# Patient Record
Sex: Female | Born: 1970 | Race: White | Hispanic: No | Marital: Married | Smoking: Former smoker
Health system: Southern US, Community
[De-identification: ages and names within clinical notes are randomized; demographics above are authoritative.]

## PROBLEM LIST (undated history)

## (undated) DIAGNOSIS — N390 Urinary tract infection, site not specified: Secondary | ICD-10-CM

## (undated) DIAGNOSIS — R51 Headache: Secondary | ICD-10-CM

## (undated) DIAGNOSIS — K589 Irritable bowel syndrome without diarrhea: Secondary | ICD-10-CM

## (undated) DIAGNOSIS — E8881 Metabolic syndrome: Secondary | ICD-10-CM

## (undated) DIAGNOSIS — R011 Cardiac murmur, unspecified: Secondary | ICD-10-CM

## (undated) DIAGNOSIS — M48061 Spinal stenosis, lumbar region without neurogenic claudication: Secondary | ICD-10-CM

## (undated) DIAGNOSIS — G9050 Complex regional pain syndrome I, unspecified: Secondary | ICD-10-CM

## (undated) DIAGNOSIS — B019 Varicella without complication: Secondary | ICD-10-CM

## (undated) DIAGNOSIS — E039 Hypothyroidism, unspecified: Secondary | ICD-10-CM

## (undated) DIAGNOSIS — F988 Other specified behavioral and emotional disorders with onset usually occurring in childhood and adolescence: Secondary | ICD-10-CM

## (undated) DIAGNOSIS — E88819 Insulin resistance, unspecified: Secondary | ICD-10-CM

## (undated) DIAGNOSIS — F329 Major depressive disorder, single episode, unspecified: Secondary | ICD-10-CM

## (undated) DIAGNOSIS — M5416 Radiculopathy, lumbar region: Secondary | ICD-10-CM

## (undated) DIAGNOSIS — K921 Melena: Secondary | ICD-10-CM

## (undated) DIAGNOSIS — F419 Anxiety disorder, unspecified: Secondary | ICD-10-CM

## (undated) DIAGNOSIS — J45909 Unspecified asthma, uncomplicated: Secondary | ICD-10-CM

## (undated) DIAGNOSIS — M1990 Unspecified osteoarthritis, unspecified site: Secondary | ICD-10-CM

## (undated) DIAGNOSIS — G90511 Complex regional pain syndrome I of right upper limb: Secondary | ICD-10-CM

## (undated) DIAGNOSIS — D649 Anemia, unspecified: Secondary | ICD-10-CM

## (undated) DIAGNOSIS — Z973 Presence of spectacles and contact lenses: Secondary | ICD-10-CM

## (undated) DIAGNOSIS — I10 Essential (primary) hypertension: Secondary | ICD-10-CM

## (undated) DIAGNOSIS — R519 Headache, unspecified: Secondary | ICD-10-CM

## (undated) DIAGNOSIS — B2790 Infectious mononucleosis, unspecified without complication: Secondary | ICD-10-CM

## (undated) DIAGNOSIS — J189 Pneumonia, unspecified organism: Secondary | ICD-10-CM

## (undated) DIAGNOSIS — Z9889 Other specified postprocedural states: Secondary | ICD-10-CM

## (undated) DIAGNOSIS — F32A Depression, unspecified: Secondary | ICD-10-CM

## (undated) DIAGNOSIS — B029 Zoster without complications: Secondary | ICD-10-CM

## (undated) DIAGNOSIS — R112 Nausea with vomiting, unspecified: Secondary | ICD-10-CM

## (undated) DIAGNOSIS — J302 Other seasonal allergic rhinitis: Secondary | ICD-10-CM

## (undated) HISTORY — DX: Infectious mononucleosis, unspecified without complication: B27.90

## (undated) HISTORY — DX: Cardiac murmur, unspecified: R01.1

## (undated) HISTORY — DX: Essential (primary) hypertension: I10

## (undated) HISTORY — DX: Headache, unspecified: R51.9

## (undated) HISTORY — DX: Varicella without complication: B01.9

## (undated) HISTORY — PX: BACK SURGERY: SHX140

## (undated) HISTORY — PX: ANTERIOR AND POSTERIOR SPINAL FUSION: SHX2259

## (undated) HISTORY — DX: Headache: R51

## (undated) HISTORY — DX: Hypothyroidism, unspecified: E03.9

## (undated) HISTORY — DX: Urinary tract infection, site not specified: N39.0

## (undated) HISTORY — DX: Depression, unspecified: F32.A

## (undated) HISTORY — DX: Other seasonal allergic rhinitis: J30.2

## (undated) HISTORY — PX: TUBAL LIGATION: SHX77

## (undated) HISTORY — PX: OTHER SURGICAL HISTORY: SHX169

## (undated) HISTORY — DX: Unspecified asthma, uncomplicated: J45.909

## (undated) HISTORY — PX: AUGMENTATION MAMMAPLASTY: SUR837

## (undated) HISTORY — DX: Melena: K92.1

## (undated) HISTORY — DX: Major depressive disorder, single episode, unspecified: F32.9

---

## 1998-03-14 HISTORY — PX: LEEP: SHX91

## 2003-03-15 HISTORY — PX: UTERINE FIBROID SURGERY: SHX826

## 2003-05-08 ENCOUNTER — Other Ambulatory Visit: Payer: Self-pay

## 2004-01-16 ENCOUNTER — Ambulatory Visit: Payer: Self-pay

## 2004-01-27 ENCOUNTER — Ambulatory Visit: Payer: Self-pay

## 2004-11-02 ENCOUNTER — Ambulatory Visit: Payer: Self-pay

## 2005-01-11 ENCOUNTER — Inpatient Hospital Stay: Payer: Self-pay

## 2006-03-14 HISTORY — PX: BREAST ENHANCEMENT SURGERY: SHX7

## 2006-09-13 ENCOUNTER — Emergency Department: Payer: Self-pay | Admitting: Emergency Medicine

## 2006-09-14 ENCOUNTER — Emergency Department: Payer: Self-pay | Admitting: Emergency Medicine

## 2006-09-25 ENCOUNTER — Ambulatory Visit: Payer: Self-pay

## 2006-10-11 ENCOUNTER — Ambulatory Visit: Payer: Self-pay

## 2007-02-15 ENCOUNTER — Ambulatory Visit (HOSPITAL_BASED_OUTPATIENT_CLINIC_OR_DEPARTMENT_OTHER): Admission: RE | Admit: 2007-02-15 | Discharge: 2007-02-16 | Payer: Self-pay | Admitting: Orthopedic Surgery

## 2007-02-26 ENCOUNTER — Encounter: Payer: Self-pay | Admitting: Orthopedic Surgery

## 2007-03-15 ENCOUNTER — Encounter: Payer: Self-pay | Admitting: Orthopedic Surgery

## 2007-03-15 HISTORY — PX: CARPAL TUNNEL RELEASE: SHX101

## 2007-03-15 HISTORY — PX: ABDOMINAL HYSTERECTOMY: SHX81

## 2007-09-21 ENCOUNTER — Ambulatory Visit (HOSPITAL_COMMUNITY): Admission: RE | Admit: 2007-09-21 | Discharge: 2007-09-22 | Payer: Self-pay | Admitting: Orthopaedic Surgery

## 2007-12-21 ENCOUNTER — Ambulatory Visit: Payer: Self-pay

## 2007-12-25 ENCOUNTER — Inpatient Hospital Stay: Payer: Self-pay

## 2008-09-02 ENCOUNTER — Emergency Department (HOSPITAL_COMMUNITY): Admission: EM | Admit: 2008-09-02 | Discharge: 2008-09-02 | Payer: Self-pay | Admitting: Emergency Medicine

## 2010-05-02 ENCOUNTER — Inpatient Hospital Stay (INDEPENDENT_AMBULATORY_CARE_PROVIDER_SITE_OTHER)
Admission: RE | Admit: 2010-05-02 | Discharge: 2010-05-02 | Disposition: A | Discharge status: Home / Self Care | Payer: Self-pay | Source: Ambulatory Visit | Attending: Emergency Medicine | Admitting: Emergency Medicine

## 2010-05-02 ENCOUNTER — Ambulatory Visit (INDEPENDENT_AMBULATORY_CARE_PROVIDER_SITE_OTHER): Payer: Self-pay

## 2010-05-02 DIAGNOSIS — J45909 Unspecified asthma, uncomplicated: Secondary | ICD-10-CM

## 2010-07-27 NOTE — Op Note (Signed)
NAMESALIMAH, MARTINOVICH NO.:  [id number]   MEDICAL RECORD NO.:  [id number]           PATIENT TYPE:   LOCATION:                                 FACILITY:   PHYSICIAN:  Mark C. Ophelia Charter, M.D.         DATE OF BIRTH:   DATE OF PROCEDURE:  09/21/2007  DATE OF DISCHARGE:                               OPERATIVE REPORT   PREOPERATIVE DIAGNOSIS:  C5-C6, C6-C7 spondylosis with disk herniation.   POSTOPERATIVE DIAGNOSIS:  C5-C6, C6-C7 spondylosis with disk herniation.   PROCEDURE:  C5-C6, C6-C7 anterior cervical diskectomy and fusion,  allograft and plating.   SURGEON:  Mark C. Ophelia Charter, MD   ASSISTANT:  Maud Deed, PA-C   ANESTHESIA:  GOT plus Marcaine local.   ESTIMATED BLOOD LOSS:  Minimal.   DRAINS:  One Hemovac neck.   IMPLANTS:  Cortical cancellous allograft lordotic.  View-lock Biomet  anterior cervical plate with 11-BJ screws.   DESCRIPTION OF PROCEDURE:  After induction of general anesthesia,  orotracheal intubation, preoperative antibiotics, surgical time-out  checklist after prepping draping, sterile Mayo stand of the head,  sterile skin marker, Biodrape and thyroid sheet and drape application.  After time-out procedure, an incision was made at the midline extending  to the left.  Platysma was divided in line with the skin incision.  Blunt dissection down to the level of the longus coli muscle was  performed with self-retaining retractors placed after identification of  the appropriate level using a lateral C-arm visualization.  Diskectomy  was performed and operative microscope was draped and brought in.  At  the C5-C6 level, there was a right paracentral disk protrusion with  overhanging spurs posteriorly which had to be trimmed and debrided for  exposure of the dura.  There was flattening of the cord once it was  decompressed, sizing the graft, placement of cortical cancellous graft  tightly with head halter traction applied by the C.R.N.A.  Identical  procedure was repeated at C6-C7 level with this level showing narrowing  down to 7 mm by MRI.  Disk osteophyte complex caused significant  foraminal compression of this level in the right and this was trimmed  and debrided back using microdissection and 110-mm Kerrisons.  Uncovertebral joints were stripped.  Plate was sized, appropriately  checked in AP and lateral and fluoroscopy and then holes were hand  drilled and then placement of screws in all six holes.  After irrigation  with saline solution, the operative field was dry.  Hemovac was placed  in an in and out technique in line with the skin incision, 3-0 Vicryl in  the platysma, 4-0 Vicryl subcuticular closure,  tincture of Benzoin and Steri-Strips, Marcaine infiltration, postop  dressing and then application of soft cervical collar and transferred to  recovery room in stable condition.  Instrument count and needle count  was correct.  Surgical checklist was completed.      Mark C. Ophelia Charter, M.D.  Electronically Signed     MCY/MEDQ  D:  10/25/2007  T:  10/26/2007  Job:  [id number]

## 2010-07-27 NOTE — Op Note (Signed)
NAMESUEELLEN, KAYES                ACCOUNT NO.:  [id number]   MEDICAL RECORD NO.:  [id number]          PATIENT TYPE:  AMB   LOCATION:  DSC                          FACILITY:  [facility]   PHYSICIAN:  Cindee Salt, M.D.       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   OPERATION:  Carpal tunnel release under stellate block, axillary, and  general.   SURGEON:  Cindee Salt, M.D.   HISTORY:  The patient is a 40 year old female who suffered an injury to  her right wrist.  She has developed complex regional pain.  With  treatment of complex regional pain, an underlying carpal tunnel syndrome  had developed as a feeding underlying etiology of her complex regional  pain.  She is been undergoing blocks with some relief.  She is admitted  now for release of her carpal tunnel.  She is aware of risks and  complications, that there is no guarantee with the surgery, possibility  of infection, recurrence, injury to arteries, nerves and tendons,  incomplete relief of symptoms, dystrophy, exacerbation of the complex  regional pain.  This will be done under a block with overnight stay for  pain control with the possibility of a second stellate block being given  in the morning, depending on symptoms.  In the preoperative area, the  patient is seen, questions encouraged and answered, a block given for  pain control.   PROCEDURE:  The patient was brought to the operating room, where a  general anesthetic was carried out without difficulty.  She was prepped  using DuraPrep, supine position, right arm free.  A longitudinal  incision was made in the palm and carried down through subcutaneous  tissue.  A tourniquet placed on the forearm was inflated to 225 mmHg  after  exsanguination.  The palmar fascia was split, bleeders  electrocauterized, superficial palmar arch identified, flexor tendons to  the ring and little finger identified.  To the ulnar side of the median  nerve, the carpal retinaculum was incised with sharp dissection.  A  right-angle and Sewall retractor were placed between skin and forearm  fascia.  The fascia was released for approximately a centimeter and half  proximal to the wrist crease under direct vision.  The canal was  explored; area of compression to the nerve was apparent.  No further  lesions were identified.  The wound was irrigated.  The skin was then  closed with interrupted 5-0 Vicryl Rapide sutures.  A sterile  compressive dressing and splint to the wrist with the fingers free were  applied.  The patient tolerated the procedure well and was taken to the  recovery room for observation in satisfactory condition.  She will be  admitted for overnight stay for pain control.  She will be discharged on  Ultram 50 mg one 6 hours with food and precautions.  ______________________________  Cindee Salt, M.D.     GK/MEDQ  D:  02/15/2007  T:  02/15/2007  Job:  [id number]

## 2010-12-09 LAB — COMPREHENSIVE METABOLIC PANEL
ALT: 13
AST: 16
Albumin: 4.2
Alkaline Phosphatase: 35 — ABNORMAL LOW
BUN: 5 — ABNORMAL LOW
CO2: 33 — ABNORMAL HIGH
Calcium: 9.5
Chloride: 96
Creatinine, Ser: 0.58
GFR calc Af Amer: >60
GFR calc non Af Amer: >60
Glucose, Bld: 101 — ABNORMAL HIGH
Potassium: 3.5
Sodium: 136
Total Bilirubin: 0.7
Total Protein: 6.7

## 2010-12-09 LAB — CBC
HCT: 39.1
Hemoglobin: 13.6
MCHC: 34.9
MCV: 93
Platelets: 250
RBC: 4.21
RDW: 10.8 — ABNORMAL LOW
WBC: 5.7

## 2010-12-09 LAB — URINALYSIS, ROUTINE W REFLEX MICROSCOPIC
Bilirubin Urine: NEGATIVE
Glucose, UA: NEGATIVE
Hgb urine dipstick: NEGATIVE
Ketones, ur: NEGATIVE
Nitrite: NEGATIVE
Protein, ur: NEGATIVE
Specific Gravity, Urine: 1.007
Urobilinogen, UA: 0.2
pH: 7.5

## 2010-12-09 LAB — PROTIME-INR
INR: 1.1
Prothrombin Time: 14

## 2010-12-09 LAB — APTT: aPTT: 27

## 2010-12-20 LAB — I-STAT 8, (EC8 V) (CONVERTED LAB)
Acid-Base Excess: 2
BUN: 19
Bicarbonate: 29.2 — ABNORMAL HIGH
Chloride: 103
Glucose, Bld: 78
HCT: 43
Hemoglobin: 14.6
Operator id: 208731
Potassium: 4
Sodium: 139
TCO2: 31
pCO2, Ven: 53.5 — ABNORMAL HIGH
pH, Ven: 7.345 — ABNORMAL HIGH

## 2011-06-21 DIAGNOSIS — J45909 Unspecified asthma, uncomplicated: Secondary | ICD-10-CM | POA: Insufficient documentation

## 2011-06-21 DIAGNOSIS — F4310 Post-traumatic stress disorder, unspecified: Secondary | ICD-10-CM | POA: Insufficient documentation

## 2012-06-06 DIAGNOSIS — G894 Chronic pain syndrome: Secondary | ICD-10-CM | POA: Insufficient documentation

## 2014-02-25 DIAGNOSIS — F988 Other specified behavioral and emotional disorders with onset usually occurring in childhood and adolescence: Secondary | ICD-10-CM | POA: Insufficient documentation

## 2014-02-25 DIAGNOSIS — F32A Depression, unspecified: Secondary | ICD-10-CM | POA: Insufficient documentation

## 2014-02-25 DIAGNOSIS — F329 Major depressive disorder, single episode, unspecified: Secondary | ICD-10-CM | POA: Insufficient documentation

## 2015-03-15 HISTORY — PX: SPINAL CORD STIMULATOR INSERTION: SHX5378

## 2016-04-19 ENCOUNTER — Other Ambulatory Visit: Payer: Self-pay | Admitting: Family Medicine

## 2016-04-19 DIAGNOSIS — Z1239 Encounter for other screening for malignant neoplasm of breast: Secondary | ICD-10-CM

## 2016-05-19 ENCOUNTER — Ambulatory Visit: Payer: Self-pay | Attending: Family Medicine

## 2017-03-29 DIAGNOSIS — F332 Major depressive disorder, recurrent severe without psychotic features: Secondary | ICD-10-CM | POA: Diagnosis not present

## 2017-03-29 DIAGNOSIS — F4310 Post-traumatic stress disorder, unspecified: Secondary | ICD-10-CM | POA: Diagnosis not present

## 2017-04-18 DIAGNOSIS — G90519 Complex regional pain syndrome I of unspecified upper limb: Secondary | ICD-10-CM | POA: Diagnosis not present

## 2017-04-18 DIAGNOSIS — Z79899 Other long term (current) drug therapy: Secondary | ICD-10-CM | POA: Diagnosis not present

## 2017-04-18 DIAGNOSIS — Z5181 Encounter for therapeutic drug level monitoring: Secondary | ICD-10-CM | POA: Diagnosis not present

## 2017-04-18 DIAGNOSIS — M25552 Pain in left hip: Secondary | ICD-10-CM | POA: Diagnosis not present

## 2017-04-18 DIAGNOSIS — G90511 Complex regional pain syndrome I of right upper limb: Secondary | ICD-10-CM | POA: Diagnosis not present

## 2017-04-18 DIAGNOSIS — G894 Chronic pain syndrome: Secondary | ICD-10-CM | POA: Diagnosis not present

## 2017-04-28 DIAGNOSIS — F332 Major depressive disorder, recurrent severe without psychotic features: Secondary | ICD-10-CM | POA: Diagnosis not present

## 2017-04-28 DIAGNOSIS — F4310 Post-traumatic stress disorder, unspecified: Secondary | ICD-10-CM | POA: Diagnosis not present

## 2017-05-02 DIAGNOSIS — F4312 Post-traumatic stress disorder, chronic: Secondary | ICD-10-CM | POA: Diagnosis not present

## 2017-05-02 DIAGNOSIS — F900 Attention-deficit hyperactivity disorder, predominantly inattentive type: Secondary | ICD-10-CM | POA: Diagnosis not present

## 2017-05-09 DIAGNOSIS — F332 Major depressive disorder, recurrent severe without psychotic features: Secondary | ICD-10-CM | POA: Diagnosis not present

## 2017-05-09 DIAGNOSIS — F4310 Post-traumatic stress disorder, unspecified: Secondary | ICD-10-CM | POA: Diagnosis not present

## 2017-05-22 DIAGNOSIS — Z9889 Other specified postprocedural states: Secondary | ICD-10-CM | POA: Diagnosis not present

## 2017-05-22 DIAGNOSIS — Z79899 Other long term (current) drug therapy: Secondary | ICD-10-CM | POA: Diagnosis not present

## 2017-05-22 DIAGNOSIS — Z5181 Encounter for therapeutic drug level monitoring: Secondary | ICD-10-CM | POA: Diagnosis not present

## 2017-05-22 DIAGNOSIS — M25552 Pain in left hip: Secondary | ICD-10-CM | POA: Diagnosis not present

## 2017-05-22 DIAGNOSIS — G894 Chronic pain syndrome: Secondary | ICD-10-CM | POA: Diagnosis not present

## 2017-06-13 DIAGNOSIS — F332 Major depressive disorder, recurrent severe without psychotic features: Secondary | ICD-10-CM | POA: Diagnosis not present

## 2017-06-13 DIAGNOSIS — F4310 Post-traumatic stress disorder, unspecified: Secondary | ICD-10-CM | POA: Diagnosis not present

## 2017-06-29 DIAGNOSIS — F332 Major depressive disorder, recurrent severe without psychotic features: Secondary | ICD-10-CM | POA: Diagnosis not present

## 2017-07-03 DIAGNOSIS — Z5181 Encounter for therapeutic drug level monitoring: Secondary | ICD-10-CM | POA: Diagnosis not present

## 2017-07-03 DIAGNOSIS — G90519 Complex regional pain syndrome I of unspecified upper limb: Secondary | ICD-10-CM | POA: Diagnosis not present

## 2017-07-03 DIAGNOSIS — M25552 Pain in left hip: Secondary | ICD-10-CM | POA: Diagnosis not present

## 2017-07-03 DIAGNOSIS — Z79899 Other long term (current) drug therapy: Secondary | ICD-10-CM | POA: Diagnosis not present

## 2017-07-03 DIAGNOSIS — G894 Chronic pain syndrome: Secondary | ICD-10-CM | POA: Diagnosis not present

## 2017-07-11 DIAGNOSIS — M7918 Myalgia, other site: Secondary | ICD-10-CM | POA: Diagnosis not present

## 2017-07-18 DIAGNOSIS — F332 Major depressive disorder, recurrent severe without psychotic features: Secondary | ICD-10-CM | POA: Diagnosis not present

## 2017-08-08 DIAGNOSIS — F332 Major depressive disorder, recurrent severe without psychotic features: Secondary | ICD-10-CM | POA: Diagnosis not present

## 2017-08-24 DIAGNOSIS — F332 Major depressive disorder, recurrent severe without psychotic features: Secondary | ICD-10-CM | POA: Diagnosis not present

## 2017-09-04 DIAGNOSIS — G894 Chronic pain syndrome: Secondary | ICD-10-CM | POA: Diagnosis not present

## 2017-09-04 DIAGNOSIS — Z6379 Other stressful life events affecting family and household: Secondary | ICD-10-CM | POA: Diagnosis not present

## 2017-09-04 DIAGNOSIS — F4310 Post-traumatic stress disorder, unspecified: Secondary | ICD-10-CM | POA: Diagnosis not present

## 2017-09-12 DIAGNOSIS — F332 Major depressive disorder, recurrent severe without psychotic features: Secondary | ICD-10-CM | POA: Diagnosis not present

## 2017-11-17 ENCOUNTER — Encounter: Payer: Self-pay | Admitting: Primary Care

## 2017-11-17 ENCOUNTER — Ambulatory Visit (INDEPENDENT_AMBULATORY_CARE_PROVIDER_SITE_OTHER): Payer: PPO | Admitting: Primary Care

## 2017-11-17 ENCOUNTER — Ambulatory Visit: Payer: PPO | Admitting: Primary Care

## 2017-11-17 VITALS — BP 124/84 | HR 92 | Temp 98.0°F | Ht 60.0 in | Wt 168.5 lb

## 2017-11-17 DIAGNOSIS — G9050 Complex regional pain syndrome I, unspecified: Secondary | ICD-10-CM | POA: Diagnosis not present

## 2017-11-17 DIAGNOSIS — F988 Other specified behavioral and emotional disorders with onset usually occurring in childhood and adolescence: Secondary | ICD-10-CM

## 2017-11-17 DIAGNOSIS — Z1239 Encounter for other screening for malignant neoplasm of breast: Secondary | ICD-10-CM

## 2017-11-17 DIAGNOSIS — F32A Depression, unspecified: Secondary | ICD-10-CM

## 2017-11-17 DIAGNOSIS — J4520 Mild intermittent asthma, uncomplicated: Secondary | ICD-10-CM | POA: Diagnosis not present

## 2017-11-17 DIAGNOSIS — E282 Polycystic ovarian syndrome: Secondary | ICD-10-CM | POA: Insufficient documentation

## 2017-11-17 DIAGNOSIS — E039 Hypothyroidism, unspecified: Secondary | ICD-10-CM | POA: Insufficient documentation

## 2017-11-17 DIAGNOSIS — I10 Essential (primary) hypertension: Secondary | ICD-10-CM

## 2017-11-17 DIAGNOSIS — F4310 Post-traumatic stress disorder, unspecified: Secondary | ICD-10-CM

## 2017-11-17 DIAGNOSIS — G894 Chronic pain syndrome: Secondary | ICD-10-CM | POA: Diagnosis not present

## 2017-11-17 DIAGNOSIS — Z1231 Encounter for screening mammogram for malignant neoplasm of breast: Secondary | ICD-10-CM | POA: Diagnosis not present

## 2017-11-17 DIAGNOSIS — F329 Major depressive disorder, single episode, unspecified: Secondary | ICD-10-CM

## 2017-11-17 LAB — COMPREHENSIVE METABOLIC PANEL
ALT: 19 U/L (ref 0–35)
AST: 15 U/L (ref 0–37)
Albumin: 4.6 g/dL (ref 3.5–5.2)
Alkaline Phosphatase: 60 U/L (ref 39–117)
BUN: 15 mg/dL (ref 6–23)
CO2: 34 mEq/L — ABNORMAL HIGH (ref 19–32)
Calcium: 9.8 mg/dL (ref 8.4–10.5)
Chloride: 97 mEq/L (ref 96–112)
Creatinine, Ser: 0.77 mg/dL (ref 0.40–1.20)
GFR: 85.6 mL/min (ref >60.00)
Glucose, Bld: 142 mg/dL — ABNORMAL HIGH (ref 70–99)
Potassium: 3.6 mEq/L (ref 3.5–5.1)
Sodium: 137 mEq/L (ref 135–145)
Total Bilirubin: 0.5 mg/dL (ref 0.2–1.2)
Total Protein: 7.2 g/dL (ref 6.0–8.3)

## 2017-11-17 LAB — HEMOGLOBIN A1C: Hgb A1c MFr Bld: 5.7 % (ref 4.6–6.5)

## 2017-11-17 LAB — T3, FREE: T3, Free: 3.2 pg/mL (ref 2.3–4.2)

## 2017-11-17 LAB — TSH: TSH: 0.69 u[IU]/mL (ref 0.35–4.50)

## 2017-11-17 LAB — T4, FREE: Free T4: 0.91 ng/dL (ref 0.60–1.60)

## 2017-11-17 MED ORDER — ALBUTEROL SULFATE HFA 108 (90 BASE) MCG/ACT IN AERS: 2.0000 | INHALATION_SPRAY | Freq: Four times a day (QID) | RESPIRATORY_TRACT | 0 refills | Status: DC | PRN

## 2017-11-17 MED ORDER — HYDROCHLOROTHIAZIDE 50 MG PO TABS: 50.0000 mg | ORAL_TABLET | Freq: Every day | ORAL | 0 refills | Status: DC

## 2017-11-17 MED ORDER — LEVOTHYROXINE SODIUM 75 MCG PO TABS
ORAL_TABLET | ORAL | 0 refills | Status: DC
Start: 2017-11-17 — End: 2018-02-12

## 2017-11-17 NOTE — Assessment & Plan Note (Signed)
Compliant to levothyroxine 75 mcg. Repeat TSH pending. Refills sent to pharmacy.

## 2017-11-17 NOTE — Assessment & Plan Note (Signed)
Chronic to left extremity, hips, back according to records.  Following with pain management, continue same.

## 2017-11-17 NOTE — Assessment & Plan Note (Signed)
Following with psychiatry, feels well managed. Continue same. 

## 2017-11-17 NOTE — Assessment & Plan Note (Signed)
Seems to be using albuterol appropriately. Will continue to monitor.

## 2017-11-17 NOTE — Patient Instructions (Addendum)
Stop by the lab prior to leaving today. I will notify you of your results once received.   I sent refills to your pharmacy of levothyroxine, hydrochlorothiazide, and albuterol inhaler.  Please schedule a physical with me at your convenience.   It was a pleasure to meet you today! Please don't hesitate to call or message me with any questions. Welcome to [facility]!

## 2017-11-17 NOTE — Progress Notes (Signed)
Subjective:    Patient ID: Tanya [name], female    DOB: Sep 14, 1970, 47 y.o.   MRN: [id number]  HPI  Tanya [name] is a 47 year old female who presents today to establish care and discuss the problems mentioned below. Will obtain old records. She was discharged from her last practice due to no shows and lack of recommended follow up. She would like her mammogram done.  1) Hypothyroidism: Currently managed on levothyroxine 75 mcg tablets. Her last documented TSH was 2.051 in October 2018. She thinks her thyroid levels may be out of range. She is compliant to her levothyroxine and took her last pill today.  2) ADD/PTSD: Currently following with Jolee Ewing and is managed on Adderall XR 20 mg, Adderall 20 mg BID, Effexor XR 150 mg, Alprazolam XR 1 mg PRN. She takes her alprazolam 2-3 times daily on average.   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 History of left hip pain, myofascial muscle pain. Previously managed on Fentanyl patches and numerous other medications (Morphine, Methadone). She is currently managed on Tiagabine 16 mg HS. She has a deep spine stimulator to her lower spine that was implanted in 2017.   4) Lower Extremity Edema/Hypertension: Currently managed on HCTZ 50 mg. Reports it is secondary to complex regional pain syndrome. She experiences edema mostly to her lower extremities, also upper extremities and face.  BP Readings from Last 3 Encounters:  11/17/17 124/84    5) PCOS: Previously managed on metformin. Her last documented A1C was 5.3 in February 2018. She is status post hysterectomy and endorses that she has one ovary remaining.   6) Asthma: Diagnosed in 1996. Currently managed on Ventolin HFA for which she uses once every 7-10 days. She is needing a refill today.  Review of Systems  Respiratory: Negative for shortness of breath and wheezing.   Cardiovascular: Negative for chest pain.  Musculoskeletal:   Chronic pain. CRPS.  Psychiatric/Behavioral:       Follows with psychiatry, feels well managed.        Past Medical History:  Diagnosis Date  . Asthma   . Blood in stool   . Chickenpox   . Depression   . Frequent headaches   . Heart murmur   . Hypertension   . Hypothyroidism   . Mononucleosis   . Seasonal allergies   . UTI (urinary tract infection)      Social History   Socioeconomic History  . Marital status: Married    Spouse name: Not on file  . Number of children: Not on file  . Years of education: Not on file  . Highest education level: Not on file  Occupational History  . Not on file  Social Needs  . Financial resource strain: Not on file  . Food insecurity:    Worry: Not on file    Inability: Not on file  . Transportation needs:    Medical: Not on file    Non-medical: Not on file  Tobacco Use  . Smoking status: Never Smoker  . Smokeless tobacco: Never Used  Substance and Sexual Activity  . Alcohol use: Yes  . Drug use: Not on file  . Sexual activity: Not on file  Lifestyle  . Physical activity:    Days per week: Not on file    Minutes per session: Not on file  . Stress: Not on file  Relationships  . Social connections:    Talks on phone:  Not on file    Gets together: Not on file    Attends religious service: Not on file    Active member of club or organization: Not on file    Attends meetings of clubs or organizations: Not on file    Relationship status: Not on file  . Intimate partner violence:    Fear of current or ex partner: Not on file    Emotionally abused: Not on file    Physically abused: Not on file    Forced sexual activity: Not on file  Other Topics Concern  . Not on file  Social History Narrative   Married.   Disabled.   Once worked as an Therapist, sports.    Past Surgical History:  Procedure Laterality Date  . ABDOMINAL HYSTERECTOMY  2009  . ANTERIOR AND POSTERIOR SPINAL FUSION    . BREAST ENHANCEMENT SURGERY  2008  . CARPAL TUNNEL  RELEASE Right 2009  . [place], 2006  . LEEP  2000  . SPINAL CORD STIMULATOR INSERTION  2017  . UTERINE FIBROID SURGERY  2005    Family History  Problem Relation Age of Onset  . Depression Mother   . Learning disabilities Mother   . Depression Father   . Early death Father   . Depression Sister   . Drug abuse Sister   . Early death Sister   . Mental illness Sister   . Miscarriages / Stillbirths Sister   . Multiple sclerosis Sister   . Alcohol abuse Daughter   . Depression Daughter   . Diabetes Daughter   . Learning disabilities Daughter   . Depression Son   . Learning disabilities Son   . Cancer Maternal Grandmother   . Hearing loss Maternal Grandmother   . Alcohol abuse Paternal Grandmother   . Heart disease Paternal Grandmother   . Hyperlipidemia Paternal Grandmother   . Hypertension Paternal Grandmother   . Kidney disease Paternal Grandmother   . ADD / ADHD Paternal Grandfather   . COPD Paternal Grandfather   . Kidney disease Paternal Grandfather   . Hypertension Paternal Grandfather   . Hyperlipidemia Paternal Grandfather   . Depression Son     Allergies  Allergen Reactions  . Hydrocodone-Acetaminophen Itching, Other (See Comments) and Hives    Other Reaction: Other reaction Other Reaction: Other reaction, hives   . Oxycodone-Acetaminophen Itching, Other (See Comments) and Hives    Other Reaction: Other reaction Other Reaction: Other reaction, hives     Current Outpatient Medications on File Prior to Visit  Medication Sig Dispense Refill  . ALPRAZOLAM XR 1 MG 24 hr tablet Take 1 mg by mouth 3 (three) times daily.     Marland Kitchen amphetamine-dextroamphetamine (ADDERALL XR) 20 MG 24 hr capsule Take 20 mg by mouth daily.     Marland Kitchen amphetamine-dextroamphetamine (ADDERALL) 20 MG tablet Take 20 mg by mouth 2 (two) times daily.    Marland Kitchen buPROPion (WELLBUTRIN XL) 150 MG 24 hr tablet Take 2 tablets by mouth every morning and 1 tablet every evening.    Marland Kitchen doxepin  (SINEQUAN) 50 MG capsule 1 or 2 tablet by mouth at bedtime    . tiaGABine (GABITRIL) 4 MG tablet Take 16 mg by mouth at bedtime.    Marland Kitchen venlafaxine XR (EFFEXOR-XR) 150 MG 24 hr capsule Take 300 mg by mouth daily with breakfast.     . vitamin B-12 (CYANOCOBALAMIN) 1000 MCG tablet Take by mouth.     No current facility-administered medications on file prior  to visit.     BP 124/84   Pulse 92   Temp 98 F (36.7 C) (Oral)   Ht 5' (1.524 m)   Wt 168 lb 8 oz (76.4 kg)   SpO2 99%   BMI 32.91 kg/m    Objective:   Physical Exam  Constitutional: She appears well-nourished.  Neck: Neck supple.  Cardiovascular: Normal rate and regular rhythm.  Respiratory: Effort normal and breath sounds normal.  Skin: Skin is warm and dry.  Psychiatric: She has a normal mood and affect.           Assessment & Plan:

## 2017-11-17 NOTE — Assessment & Plan Note (Signed)
Managed on high doses of HCTZ, endorses this is for her edema secondary to CRPS. Based off of record review it also appears she has hypertension. Once manage done clonidine patches, likely for HTN.  BP stable in the office today, continue current regimen. BMP pending.

## 2017-11-17 NOTE — Assessment & Plan Note (Signed)
Following with pain management and feels well managed. Continue current regimen.

## 2017-11-17 NOTE — Assessment & Plan Note (Signed)
Following with psychiatry, compliant to regimen.  Continue same.

## 2017-11-17 NOTE — Assessment & Plan Note (Signed)
One ovary remaining per patient. Check A1C.

## 2017-11-21 ENCOUNTER — Encounter: Payer: Self-pay | Admitting: *Deleted

## 2017-11-21 ENCOUNTER — Encounter (INDEPENDENT_AMBULATORY_CARE_PROVIDER_SITE_OTHER): Payer: Self-pay

## 2017-11-28 ENCOUNTER — Encounter

## 2017-11-28 ENCOUNTER — Ambulatory Visit: Payer: PPO | Admitting: Primary Care

## 2017-12-05 DIAGNOSIS — F332 Major depressive disorder, recurrent severe without psychotic features: Secondary | ICD-10-CM | POA: Diagnosis not present

## 2017-12-14 ENCOUNTER — Encounter: Payer: PPO | Admitting: Primary Care

## 2017-12-15 DIAGNOSIS — M25552 Pain in left hip: Secondary | ICD-10-CM | POA: Diagnosis not present

## 2017-12-15 DIAGNOSIS — G8929 Other chronic pain: Secondary | ICD-10-CM | POA: Diagnosis not present

## 2017-12-19 ENCOUNTER — Encounter: Payer: Self-pay | Admitting: Primary Care

## 2017-12-19 ENCOUNTER — Telehealth: Payer: Self-pay

## 2017-12-19 ENCOUNTER — Ambulatory Visit (INDEPENDENT_AMBULATORY_CARE_PROVIDER_SITE_OTHER): Payer: PPO | Admitting: Primary Care

## 2017-12-19 VITALS — BP 120/82 | HR 78 | Temp 98.6°F | Ht 60.0 in | Wt 173.2 lb

## 2017-12-19 DIAGNOSIS — R8290 Unspecified abnormal findings in urine: Secondary | ICD-10-CM | POA: Diagnosis not present

## 2017-12-19 LAB — POC URINALSYSI DIPSTICK (AUTOMATED)
Bilirubin, UA: NEGATIVE
Blood, UA: NEGATIVE
Glucose, UA: NEGATIVE
Ketones, UA: NEGATIVE
Leukocytes, UA: NEGATIVE
Nitrite, UA: NEGATIVE
Protein, UA: NEGATIVE
Spec Grav, UA: 1.015 (ref 1.010–1.025)
Urobilinogen, UA: 0.2 E.U./dL
pH, UA: 6 (ref 5.0–8.0)

## 2017-12-19 NOTE — Patient Instructions (Signed)
We will send off your urine to culture.  Make sure to drink plenty of water and rest. You can try applying heat/ice to the lower back, okay to take Naproxen as needed.  We will see you Thursday this week for your physical.   It was a pleasure to see you today!

## 2017-12-19 NOTE — Telephone Encounter (Signed)
Patient evaluated.  

## 2017-12-19 NOTE — Progress Notes (Signed)
Subjective:    Patient ID: Tanya [name], female    DOB: May 20, 1970, 47 y.o.   MRN: [id number]  HPI  Tanya [name] is a 47 year old female with a history of chronic pain syndrome who presents today with a chief complaint of back pain.  She also reports foul smelling urine, pelvic pain, urinary urgency, cloudy urine, nausea. Her symptoms began 2-3 days ago. She denies vaginal discharge, vaginal itching, hematuria. She denies fevers, vomiting.  Review of Systems  Constitutional: Negative for fever.  Genitourinary: Positive for pelvic pain and urgency. Negative for dysuria and hematuria.       Cloudy and foul smelling urine       Past Medical History:  Diagnosis Date  . Asthma   . Blood in stool   . Chickenpox   . Depression   . Frequent headaches   . Heart murmur   . Hypertension   . Hypothyroidism   . Mononucleosis   . Seasonal allergies   . UTI (urinary tract infection)      Social History   Socioeconomic History  . Marital status: Married    Spouse name: Not on file  . Number of children: Not on file  . Years of education: Not on file  . Highest education level: Not on file  Occupational History  . Not on file  Social Needs  . Financial resource strain: Not on file  . Food insecurity:    Worry: Not on file    Inability: Not on file  . Transportation needs:    Medical: Not on file    Non-medical: Not on file  Tobacco Use  . Smoking status: Never Smoker  . Smokeless tobacco: Never Used  Substance and Sexual Activity  . Alcohol use: Yes  . Drug use: Not on file  . Sexual activity: Not on file  Lifestyle  . Physical activity:    Days per week: Not on file    Minutes per session: Not on file  . Stress: Not on file  Relationships  . Social connections:    Talks on phone: Not on file    Gets together: Not on file    Attends religious service: Not on file    Active member of club or organization: Not on file    Attends meetings of clubs or organizations: Not  on file    Relationship status: Not on file  . Intimate partner violence:    Fear of current or ex partner: Not on file    Emotionally abused: Not on file    Physically abused: Not on file    Forced sexual activity: Not on file  Other Topics Concern  . Not on file  Social History Narrative   Married.   Disabled.   Once worked as an Therapist, sports.    Past Surgical History:  Procedure Laterality Date  . ABDOMINAL HYSTERECTOMY  2009  . ANTERIOR AND POSTERIOR SPINAL FUSION    . BREAST ENHANCEMENT SURGERY  2008  . CARPAL TUNNEL RELEASE Right 2009  . [place], 2006  . LEEP  2000  . SPINAL CORD STIMULATOR INSERTION  2017  . UTERINE FIBROID SURGERY  2005    Family History  Problem Relation Age of Onset  . Depression Mother   . Learning disabilities Mother   . Depression Father   . Early death Father   . Depression Sister   . Drug abuse Sister   . Early death Sister   .  Mental illness Sister   . Miscarriages / Stillbirths Sister   . Multiple sclerosis Sister   . Alcohol abuse Daughter   . Depression Daughter   . Diabetes Daughter   . Learning disabilities Daughter   . Depression Son   . Learning disabilities Son   . Cancer Maternal Grandmother   . Hearing loss Maternal Grandmother   . Alcohol abuse Paternal Grandmother   . Heart disease Paternal Grandmother   . Hyperlipidemia Paternal Grandmother   . Hypertension Paternal Grandmother   . Kidney disease Paternal Grandmother   . ADD / ADHD Paternal Grandfather   . COPD Paternal Grandfather   . Kidney disease Paternal Grandfather   . Hypertension Paternal Grandfather   . Hyperlipidemia Paternal Grandfather   . Depression Son     Allergies  Allergen Reactions  . Hydrocodone-Acetaminophen Itching, Other (See Comments) and Hives    Other Reaction: Other reaction Other Reaction: Other reaction, hives   . Oxycodone-Acetaminophen Itching, Other (See Comments) and Hives    Other Reaction: Other reaction Other  Reaction: Other reaction, hives     Current Outpatient Medications on File Prior to Visit  Medication Sig Dispense Refill  . albuterol (PROVENTIL HFA;VENTOLIN HFA) 108 (90 Base) MCG/ACT inhaler Inhale 2 puffs into the lungs every 6 (six) hours as needed for wheezing or shortness of breath. 1 Inhaler 0  . ALPRAZOLAM XR 1 MG 24 hr tablet Take 1 mg by mouth 3 (three) times daily.     Marland Kitchen amphetamine-dextroamphetamine (ADDERALL XR) 20 MG 24 hr capsule Take 20 mg by mouth daily.     Marland Kitchen amphetamine-dextroamphetamine (ADDERALL) 20 MG tablet Take 20 mg by mouth 2 (two) times daily.    Marland Kitchen buPROPion (WELLBUTRIN XL) 150 MG 24 hr tablet Take 2 tablets by mouth every morning and 1 tablet every evening.    Marland Kitchen doxepin (SINEQUAN) 50 MG capsule 1 or 2 tablet by mouth at bedtime    . hydrochlorothiazide (HYDRODIURIL) 50 MG tablet Take 1 tablet (50 mg total) by mouth daily. 90 tablet 0  . levothyroxine (SYNTHROID, LEVOTHROID) 75 MCG tablet Take 1 tablet by mouth every morning on an empty stomach with water. No food or medications for 30 minutes. 90 tablet 0  . tiaGABine (GABITRIL) 4 MG tablet Take 16 mg by mouth at bedtime.    Marland Kitchen venlafaxine XR (EFFEXOR-XR) 150 MG 24 hr capsule Take 300 mg by mouth daily with breakfast.     . vitamin B-12 (CYANOCOBALAMIN) 1000 MCG tablet Take by mouth.     No current facility-administered medications on file prior to visit.     BP 120/82   Pulse 78   Temp 98.6 F (37 C) (Oral)   Ht 5' (1.524 m)   Wt 173 lb 4 oz (78.6 kg)   SpO2 98%   BMI 33.84 kg/m    Objective:   Physical Exam  Constitutional: She appears well-nourished.  Neck: Neck supple.  Cardiovascular: Normal rate and regular rhythm.  Respiratory: Effort normal and breath sounds normal.  GI: Soft. Normal appearance. There is tenderness in the suprapubic area. There is no CVA tenderness.    Tender to mid suprapubic region, bilateral lower back. No CVA tenderness.   Musculoskeletal:       Back:  Skin: Skin is  warm and dry.           Assessment & Plan:  Foul Smelling Urine:  Also with urinary urgency, suprapubic pressure. UA today clear.  Suspect more MSK to be  cause given tenderness. Offered pelvic exam today to rule out other infection/cause for which she kindly declines. Also offered KUB xray today for which she declines. Discussed heat/ice, stretching, Naproxen PRN. Wait urine culture results.  Pleas Koch, NP

## 2017-12-19 NOTE — Telephone Encounter (Signed)
Pt has appt to see Gentry Fitz NP this morning; pt already arrived.

## 2017-12-19 NOTE — Telephone Encounter (Signed)
PLEASE NOTE: All timestamps contained within this report are represented as [place] Standard Time. CONFIDENTIALTY NOTICE: This fax transmission is intended only for the addressee. It contains information that is legally privileged, confidential or otherwise protected from use or disclosure. If you are not the intended recipient, you are strictly prohibited from reviewing, disclosing, copying using or disseminating any of this information or taking any action in reliance on or regarding this information. If you have received this fax in error, please notify us immediately by telephone so that we can arrange for its return to [place]. Phone: [phone], Toll-Free: [phone], Fax: [phone] Page: 1 of 2 Call Id: [id number] Filer Patient Name: Tanya [name] Gender: Female DOB: 05-13-1970 Age: 47 Y 51 M 18 D Return Phone Number: [phone] (Primary) Address: City/State/Zip: [place] [address] Client [facility] - Client Client Site [facility] - Night Physician Alma Friendly - NP Contact Type Call Who Is Calling Patient / Member / Family / Caregiver Call Type Triage / Clinical Relationship To Patient Self Return Phone Number [phone] (Primary) Chief Complaint Abdominal Pain Reason for Call Request to Schedule Office Appointment Initial Comment Caller states she would like to schedule an appointment. Caller states she has a kidney infection. Caller states she has lower Abd pain all the way around completely bilaterally. Her urine smells like asparagus. No fever. 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 Translation No Nurse Assessment Nurse: Nicki Reaper, RN, Malachy Mood Date/Time (Eastern Time): 12/18/2017 9:27:53 PM Confirm and document reason for call. If symptomatic, describe symptoms. ---Caller states she may have a kidney infection. Has lower abd pain.  Urine is cloudy and smells like asparagus. Has urgency. Symptoms started last week. She tried a herbal remedy. Does the patient have any new or worsening symptoms? ---Yes Will a triage be completed? ---Yes Related visit to physician within the last 2 weeks? ---No Does the PT have any chronic conditions? (i.e. diabetes, asthma, this includes High risk factors for pregnancy, etc.) ---Yes List chronic conditions. ---Asthma Complex [facility] Is the patient pregnant or possibly pregnant? (Ask all females between the ages of 28-55) ---No Is this a behavioral health or substance abuse call? ---No Guidelines Guideline Title Affirmed Question Affirmed Notes Nurse Date/Time (Eastern Time) Abdominal Pain - Female [1] MILD-MODERATE pain AND [2] constant AND [3] present > 2 hours Nicki Reaper, RN, Malachy Mood 12/18/2017 9:31:04 PM Disp. Time Eilene Ghazi Time) Disposition Final User PLEASE NOTE: All timestamps contained within this report are represented as [place] Standard Time. CONFIDENTIALTY NOTICE: This fax transmission is intended only for the addressee. It contains information that is legally privileged, confidential or otherwise protected from use or disclosure. If you are not the intended recipient, you are strictly prohibited from reviewing, disclosing, copying using or disseminating any of this information or taking any action in reliance on or regarding this information. If you have received this fax in error, please notify us immediately by telephone so that we can arrange for its return to [place]. Phone: [phone], Toll-Free: [phone], Fax: [phone] Page: 2 of 2 Call Id: [id number] 12/18/2017 9:34:53 PM See HCP within 4 Hours (or PCP triage) Lawson Fiscal, RN, Erskine Speed Disagree/Comply Disagree Caller Understands Yes PreDisposition Home Care Care Advice Given Per Guideline CARE ADVICE given per Abdominal Pain, Female (Adult) guideline. * You become worse. CALL BACK IF: NOTHING  BY MOUTH: Do not eat or drink anything for now. SEE HCP  WITHIN 4 HOURS (OR PCP TRIAGE): * IF OFFICE WILL BE OPEN: You need to be seen within the next 3 or 4 hours. Call your doctor (or NP/PA) now or as soon as the office opens. Referrals GO TO FACILITY REFUSED

## 2017-12-20 DIAGNOSIS — F332 Major depressive disorder, recurrent severe without psychotic features: Secondary | ICD-10-CM | POA: Diagnosis not present

## 2017-12-21 ENCOUNTER — Other Ambulatory Visit (HOSPITAL_COMMUNITY)
Admission: RE | Admit: 2017-12-21 | Discharge: 2017-12-21 | Disposition: A | Discharge status: Home / Self Care | Payer: PPO | Source: Ambulatory Visit | Attending: Primary Care | Admitting: Primary Care

## 2017-12-21 ENCOUNTER — Ambulatory Visit (INDEPENDENT_AMBULATORY_CARE_PROVIDER_SITE_OTHER): Payer: PPO | Admitting: Primary Care

## 2017-12-21 ENCOUNTER — Encounter: Payer: Self-pay | Admitting: Primary Care

## 2017-12-21 VITALS — BP 130/82 | HR 72 | Temp 98.6°F | Ht 60.0 in | Wt 169.8 lb

## 2017-12-21 DIAGNOSIS — Z0000 Encounter for general adult medical examination without abnormal findings: Secondary | ICD-10-CM

## 2017-12-21 DIAGNOSIS — R102 Pelvic and perineal pain: Secondary | ICD-10-CM | POA: Insufficient documentation

## 2017-12-21 DIAGNOSIS — Z0001 Encounter for general adult medical examination with abnormal findings: Secondary | ICD-10-CM | POA: Insufficient documentation

## 2017-12-21 DIAGNOSIS — L739 Follicular disorder, unspecified: Secondary | ICD-10-CM | POA: Insufficient documentation

## 2017-12-21 DIAGNOSIS — Z124 Encounter for screening for malignant neoplasm of cervix: Secondary | ICD-10-CM | POA: Diagnosis not present

## 2017-12-21 DIAGNOSIS — E039 Hypothyroidism, unspecified: Secondary | ICD-10-CM | POA: Diagnosis not present

## 2017-12-21 DIAGNOSIS — I10 Essential (primary) hypertension: Secondary | ICD-10-CM

## 2017-12-21 DIAGNOSIS — N3000 Acute cystitis without hematuria: Secondary | ICD-10-CM | POA: Diagnosis not present

## 2017-12-21 DIAGNOSIS — Z23 Encounter for immunization: Secondary | ICD-10-CM | POA: Diagnosis not present

## 2017-12-21 DIAGNOSIS — R21 Rash and other nonspecific skin eruption: Secondary | ICD-10-CM

## 2017-12-21 DIAGNOSIS — E282 Polycystic ovarian syndrome: Secondary | ICD-10-CM

## 2017-12-21 LAB — LIPID PANEL
Cholesterol: 166 mg/dL (ref 0–200)
HDL: 73.3 mg/dL (ref >39.00)
LDL Cholesterol: 80 mg/dL (ref 0–99)
NonHDL: 92.51
Total CHOL/HDL Ratio: 2
Triglycerides: 64 mg/dL (ref 0.0–149.0)
VLDL: 12.8 mg/dL (ref 0.0–40.0)

## 2017-12-21 LAB — URINE CULTURE
MICRO NUMBER:: 91208636
SPECIMEN QUALITY:: ADEQUATE

## 2017-12-21 MED ORDER — CEPHALEXIN 500 MG PO CAPS: 500.0000 mg | ORAL_CAPSULE | Freq: Two times a day (BID) | ORAL | 0 refills | Status: DC

## 2017-12-21 NOTE — Assessment & Plan Note (Signed)
Sores to bilateral lower extremities for years. One pustule noted. Referral placed to dermatology for further evaluation.

## 2017-12-21 NOTE — Assessment & Plan Note (Signed)
Recent thyroid testing normal. Continue levothyroxine 75 mcg daily.

## 2017-12-21 NOTE — Assessment & Plan Note (Signed)
Stable in the office today, continue to monitor.  

## 2017-12-21 NOTE — Assessment & Plan Note (Signed)
Recent A1C of 5.7. Discussed to continue to work on diet and to get regular exercise.

## 2017-12-21 NOTE — Assessment & Plan Note (Signed)
Td due, provided today. Influenza vaccination provided today.  Pap smear due, completed. Cervix remains from hysterectomy.  Mammogram due and is pending. Recommended to work on diet and exercise. Exam with stable, rash has been present for years. Lab pending. Follow up in 1 year for CPE.

## 2017-12-21 NOTE — Progress Notes (Signed)
Subjective:    Patient ID: Tanya [name], female    DOB: June 22, 1970, 47 y.o.   MRN: [id number]  HPI  Tanya [name] is a 47 year old female who presents today for complete physical. She also has a chief complaint of rash. She was also seen two days ago for UTI symptoms and her urine culture has come back positive.  1)Rash: Red spots to the bilateral lower extremities for years. She thinks this is secondary to her complex pain syndrome. She's apply tea tree oil, antibacterial soap, acne washes (benzoyl peroxide, Salactic acid, charcoal) without improvement. She does pick at the sores because pustules from the spots will form and "burst" onto her clothes if she doesn't.  Immunizations: -Tetanus: Unsure, over 10 years  -Influenza: Due   Diet: She endorses a healthy diet. Breakfast: Protein shake, oatmeal, sometimes skips Lunch: Protein shake, left overs Dinner: Meat, vegetables, starch Snacks: Occasionally granola bar Desserts: Frozen cherries Beverages: Coffee, water, protein shake, diet soda  Exercise: She does not regularly exercise [name] exam: Completed in 2018 Dental exam: No recent exam.  Mammogram: Ordered and pending   Review of Systems  Constitutional: Negative for fever and unexpected weight change.  HENT: Negative for rhinorrhea.   Respiratory: Negative for cough and shortness of breath.   Cardiovascular: Negative for chest pain.  Gastrointestinal: Negative for constipation and diarrhea.  Genitourinary: Positive for urgency. Negative for difficulty urinating.       Pelvic pressure  Musculoskeletal: Negative for arthralgias and myalgias.  Skin: Positive for rash.       Red spots to bilateral lower extremities  Allergic/Immunologic: Negative for environmental allergies.  Neurological: Negative for dizziness, numbness and headaches.  Psychiatric/Behavioral:       Feels well managed on current regimen.        Past Medical History:  Diagnosis Date  . Asthma   . Blood  in stool   . Chickenpox   . Depression   . Frequent headaches   . Heart murmur   . Hypertension   . Hypothyroidism   . Mononucleosis   . Seasonal allergies   . UTI (urinary tract infection)      Social History   Socioeconomic History  . Marital status: Married    Spouse name: Not on file  . Number of children: Not on file  . Years of education: Not on file  . Highest education level: Not on file  Occupational History  . Not on file  Social Needs  . Financial resource strain: Not on file  . Food insecurity:    Worry: Not on file    Inability: Not on file  . Transportation needs:    Medical: Not on file    Non-medical: Not on file  Tobacco Use  . Smoking status: Never Smoker  . Smokeless tobacco: Never Used  Substance and Sexual Activity  . Alcohol use: Yes  . Drug use: Not on file  . Sexual activity: Not on file  Lifestyle  . Physical activity:    Days per week: Not on file    Minutes per session: Not on file  . Stress: Not on file  Relationships  . Social connections:    Talks on phone: Not on file    Gets together: Not on file    Attends religious service: Not on file    Active member of club or organization: Not on file    Attends meetings of clubs or organizations: Not on file  Relationship status: Not on file  . Intimate partner violence:    Fear of current or ex partner: Not on file    Emotionally abused: Not on file    Physically abused: Not on file    Forced sexual activity: Not on file  Other Topics Concern  . Not on file  Social History Narrative   Married.   Disabled.   Once worked as an Therapist, sports.    Past Surgical History:  Procedure Laterality Date  . ABDOMINAL HYSTERECTOMY  2009  . ANTERIOR AND POSTERIOR SPINAL FUSION    . BREAST ENHANCEMENT SURGERY  2008  . CARPAL TUNNEL RELEASE Right 2009  . [place], 2006  . LEEP  2000  . SPINAL CORD STIMULATOR INSERTION  2017  . UTERINE FIBROID SURGERY  2005    Family History    Problem Relation Age of Onset  . Depression Mother   . Learning disabilities Mother   . Depression Father   . Early death Father   . Depression Sister   . Drug abuse Sister   . Early death Sister   . Mental illness Sister   . Miscarriages / Stillbirths Sister   . Multiple sclerosis Sister   . Alcohol abuse Daughter   . Depression Daughter   . Diabetes Daughter   . Learning disabilities Daughter   . Depression Son   . Learning disabilities Son   . Cancer Maternal Grandmother   . Hearing loss Maternal Grandmother   . Alcohol abuse Paternal Grandmother   . Heart disease Paternal Grandmother   . Hyperlipidemia Paternal Grandmother   . Hypertension Paternal Grandmother   . Kidney disease Paternal Grandmother   . ADD / ADHD Paternal Grandfather   . COPD Paternal Grandfather   . Kidney disease Paternal Grandfather   . Hypertension Paternal Grandfather   . Hyperlipidemia Paternal Grandfather   . Depression Son     Allergies  Allergen Reactions  . Hydrocodone-Acetaminophen Itching, Other (See Comments) and Hives    Other Reaction: Other reaction Other Reaction: Other reaction, hives   . Oxycodone-Acetaminophen Itching, Other (See Comments) and Hives    Other Reaction: Other reaction Other Reaction: Other reaction, hives     Current Outpatient Medications on File Prior to Visit  Medication Sig Dispense Refill  . albuterol (PROVENTIL HFA;VENTOLIN HFA) 108 (90 Base) MCG/ACT inhaler Inhale 2 puffs into the lungs every 6 (six) hours as needed for wheezing or shortness of breath. 1 Inhaler 0  . ALPRAZOLAM XR 1 MG 24 hr tablet Take 1 mg by mouth 3 (three) times daily.     Marland Kitchen amphetamine-dextroamphetamine (ADDERALL XR) 20 MG 24 hr capsule Take 20 mg by mouth daily.     Marland Kitchen amphetamine-dextroamphetamine (ADDERALL) 20 MG tablet Take 20 mg by mouth 2 (two) times daily.    Marland Kitchen buPROPion (WELLBUTRIN XL) 150 MG 24 hr tablet Take 2 tablets by mouth every morning and 1 tablet every evening.     Marland Kitchen doxepin (SINEQUAN) 50 MG capsule 1 or 2 tablet by mouth at bedtime    . hydrochlorothiazide (HYDRODIURIL) 50 MG tablet Take 1 tablet (50 mg total) by mouth daily. 90 tablet 0  . levothyroxine (SYNTHROID, LEVOTHROID) 75 MCG tablet Take 1 tablet by mouth every morning on an empty stomach with water. No food or medications for 30 minutes. 90 tablet 0  . tiaGABine (GABITRIL) 4 MG tablet Take 16 mg by mouth at bedtime.    Marland Kitchen venlafaxine XR (EFFEXOR-XR) 150 MG  24 hr capsule Take 300 mg by mouth daily with breakfast.     . vitamin B-12 (CYANOCOBALAMIN) 1000 MCG tablet Take by mouth.     No current facility-administered medications on file prior to visit.     BP 130/82   Pulse 72   Temp 98.6 F (37 C) (Oral)   Wt 169 lb 12 oz (77 kg)   SpO2 97%   BMI 33.15 kg/m    Objective:   Physical Exam  Constitutional: She is oriented to person, place, and time. She appears well-nourished.  HENT:  Mouth/Throat: No oropharyngeal exudate.  Eyes: Pupils are equal, round, and reactive to light. EOM are normal.  Neck: Neck supple. No thyromegaly present.  Cardiovascular: Normal rate and regular rhythm.  Respiratory: Effort normal and breath sounds normal.  GI: Soft. Bowel sounds are normal. There is no tenderness.  Musculoskeletal: Normal range of motion.  Neurological: She is alert and oriented to person, place, and time.  Skin: Skin is warm and dry.  Numerous circular red spots with some pustules to bilateral lower extremities.   Psychiatric: She has a normal mood and affect.           Assessment & Plan:  Acute Cystitis:  Evaluated 2 days ago with UTI symptoms. UA two days ago negative. Culture returned positive for strep agalactiae. Rx for cephalexin sent to pharmacy.  Pleas Koch, NP

## 2017-12-21 NOTE — Patient Instructions (Addendum)
Start Cephalexin antibiotics for the infection. Take 1 capsule by mouth twice daily for 7 days.  You will be contacted regarding your referral to dermatology.  Please let us know if you have not been contacted within one week.   You were provided with a tetanus and influenza vaccination today. The tetanus will cover you for 10 years.  Make sure to eat a healthy diet with plenty of vegetables, fruit, whole grains, lean protein.   Ensure you are consuming 64 ounces of water daily.  Stop by the lab prior to leaving today. I will notify you of your results once received.   We will see you in one year for your annual exam or sooner if needed.  It was a pleasure to see you today!   Preventive Care 40-64 Years, Female Preventive care refers to lifestyle choices and visits with your health care provider that can promote health and wellness. What does preventive care include?  A yearly physical exam. This is also called an annual well check.  Dental exams once or twice a year.  Routine eye exams. Ask your health care provider how often you should have your eyes checked.  Personal lifestyle choices, including: ? Daily care of your teeth and gums. ? Regular physical activity. ? Eating a healthy diet. ? Avoiding tobacco and drug use. ? Limiting alcohol use. ? Practicing safe sex. ? Taking low-dose aspirin daily starting at age 64. ? Taking vitamin and mineral supplements as recommended by your health care provider. What happens during an annual well check? The services and screenings done by your health care provider during your annual well check will depend on your age, overall health, lifestyle risk factors, and family history of disease. Counseling Your health care provider may ask you questions about your:  Alcohol use.  Tobacco use.  Drug use.  Emotional well-being.  Home and relationship well-being.  Sexual activity.  Eating habits.  Work and work  Statistician.  Method of birth control.  Menstrual cycle.  Pregnancy history.  Screening You may have the following tests or measurements:  Height, weight, and BMI.  Blood pressure.  Lipid and cholesterol levels. These may be checked every 5 years, or more frequently if you are over 23 years old.  Skin check.  Lung cancer screening. You may have this screening every year starting at age 23 if you have a 30-pack-year history of smoking and currently smoke or have quit within the past 15 years.  Fecal occult blood test (FOBT) of the stool. You may have this test every year starting at age 46.  Flexible sigmoidoscopy or colonoscopy. You may have a sigmoidoscopy every 5 years or a colonoscopy every 10 years starting at age 60.  Hepatitis C blood test.  Hepatitis B blood test.  Sexually transmitted disease (STD) testing.  Diabetes screening. This is done by checking your blood sugar (glucose) after you have not eaten for a while (fasting). You may have this done every 1-3 years.  Mammogram. This may be done every 1-2 years. Talk to your health care provider about when you should start having regular mammograms. This may depend on whether you have a family history of breast cancer.  BRCA-related cancer screening. This may be done if you have a family history of breast, ovarian, tubal, or peritoneal cancers.  Pelvic exam and Pap test. This may be done every 3 years starting at age 37. Starting at age 30, this may be done every 5 years if you have  a Pap test in combination with an HPV test.  Bone density scan. This is done to screen for osteoporosis. You may have this scan if you are at high risk for osteoporosis.  Discuss your test results, treatment options, and if necessary, the need for more tests with your health care provider. Vaccines Your health care provider may recommend certain vaccines, such as:  Influenza vaccine. This is recommended every year.  Tetanus,  diphtheria, and acellular pertussis (Tdap, Td) vaccine. You may need a Td booster every 10 years.  Varicella vaccine. You may need this if you have not been vaccinated.  Zoster vaccine. You may need this after age 77.  Measles, mumps, and rubella (MMR) vaccine. You may need at least one dose of MMR if you were born in 1957 or later. You may also need a second dose.  Pneumococcal 13-valent conjugate (PCV13) vaccine. You may need this if you have certain conditions and were not previously vaccinated.  Pneumococcal polysaccharide (PPSV23) vaccine. You may need one or two doses if you smoke cigarettes or if you have certain conditions.  Meningococcal vaccine. You may need this if you have certain conditions.  Hepatitis A vaccine. You may need this if you have certain conditions or if you travel or work in places where you may be exposed to hepatitis A.  Hepatitis B vaccine. You may need this if you have certain conditions or if you travel or work in places where you may be exposed to hepatitis B.  Haemophilus influenzae type b (Hib) vaccine. You may need this if you have certain conditions.  Talk to your health care provider about which screenings and vaccines you need and how often you need them. This information is not intended to replace advice given to you by your health care provider. Make sure you discuss any questions you have with your health care provider. Document Released: 03/27/2015 Document Revised: 11/18/2015 Document Reviewed: 12/30/2014 Elsevier Interactive Patient Education  [employer].

## 2017-12-22 LAB — CYTOLOGY - PAP
Adequacy: ABSENT
Bacterial vaginitis: NEGATIVE
Candida vaginitis: NEGATIVE
Chlamydia: NEGATIVE
Diagnosis: NEGATIVE
HPV: NOT DETECTED
Neisseria Gonorrhea: NEGATIVE
Trichomonas: NEGATIVE

## 2017-12-22 NOTE — Addendum Note (Signed)
Addended by: Jacqualin Combes on: 12/22/2017 07:40 AM   Modules accepted: Orders

## 2017-12-25 ENCOUNTER — Encounter (INDEPENDENT_AMBULATORY_CARE_PROVIDER_SITE_OTHER): Payer: Self-pay

## 2017-12-25 ENCOUNTER — Encounter: Payer: Self-pay | Admitting: *Deleted

## 2018-01-01 DIAGNOSIS — F411 Generalized anxiety disorder: Secondary | ICD-10-CM | POA: Diagnosis not present

## 2018-01-01 DIAGNOSIS — F4312 Post-traumatic stress disorder, chronic: Secondary | ICD-10-CM | POA: Diagnosis not present

## 2018-01-01 DIAGNOSIS — F900 Attention-deficit hyperactivity disorder, predominantly inattentive type: Secondary | ICD-10-CM | POA: Diagnosis not present

## 2018-01-02 ENCOUNTER — Ambulatory Visit
Admission: RE | Admit: 2018-01-02 | Discharge: 2018-01-02 | Disposition: A | Discharge status: Home / Self Care | Payer: PPO | Source: Ambulatory Visit | Attending: Primary Care | Admitting: Primary Care

## 2018-01-02 ENCOUNTER — Other Ambulatory Visit: Payer: Self-pay | Admitting: Primary Care

## 2018-01-02 DIAGNOSIS — Z1239 Encounter for other screening for malignant neoplasm of breast: Secondary | ICD-10-CM

## 2018-01-02 DIAGNOSIS — Z1231 Encounter for screening mammogram for malignant neoplasm of breast: Secondary | ICD-10-CM | POA: Diagnosis not present

## 2018-01-11 DIAGNOSIS — F332 Major depressive disorder, recurrent severe without psychotic features: Secondary | ICD-10-CM | POA: Diagnosis not present

## 2018-01-16 DIAGNOSIS — Z79899 Other long term (current) drug therapy: Secondary | ICD-10-CM | POA: Diagnosis not present

## 2018-01-16 DIAGNOSIS — G90519 Complex regional pain syndrome I of unspecified upper limb: Secondary | ICD-10-CM | POA: Diagnosis not present

## 2018-01-16 DIAGNOSIS — G8929 Other chronic pain: Secondary | ICD-10-CM | POA: Diagnosis not present

## 2018-01-16 DIAGNOSIS — Z5181 Encounter for therapeutic drug level monitoring: Secondary | ICD-10-CM | POA: Diagnosis not present

## 2018-01-16 DIAGNOSIS — M25551 Pain in right hip: Secondary | ICD-10-CM | POA: Diagnosis not present

## 2018-01-16 DIAGNOSIS — M25552 Pain in left hip: Secondary | ICD-10-CM | POA: Diagnosis not present

## 2018-01-26 ENCOUNTER — Other Ambulatory Visit: Payer: Self-pay | Admitting: Primary Care

## 2018-01-26 DIAGNOSIS — B359 Dermatophytosis, unspecified: Secondary | ICD-10-CM | POA: Diagnosis not present

## 2018-01-26 DIAGNOSIS — L700 Acne vulgaris: Secondary | ICD-10-CM | POA: Diagnosis not present

## 2018-01-26 DIAGNOSIS — L739 Follicular disorder, unspecified: Secondary | ICD-10-CM | POA: Diagnosis not present

## 2018-01-26 DIAGNOSIS — J4520 Mild intermittent asthma, uncomplicated: Secondary | ICD-10-CM

## 2018-01-26 DIAGNOSIS — L718 Other rosacea: Secondary | ICD-10-CM | POA: Diagnosis not present

## 2018-01-26 DIAGNOSIS — L738 Other specified follicular disorders: Secondary | ICD-10-CM | POA: Diagnosis not present

## 2018-02-02 DIAGNOSIS — F332 Major depressive disorder, recurrent severe without psychotic features: Secondary | ICD-10-CM | POA: Diagnosis not present

## 2018-02-12 ENCOUNTER — Other Ambulatory Visit: Payer: Self-pay | Admitting: Primary Care

## 2018-02-12 DIAGNOSIS — E039 Hypothyroidism, unspecified: Secondary | ICD-10-CM

## 2018-02-12 DIAGNOSIS — G9050 Complex regional pain syndrome I, unspecified: Secondary | ICD-10-CM

## 2018-02-13 DIAGNOSIS — M25551 Pain in right hip: Secondary | ICD-10-CM | POA: Diagnosis not present

## 2018-02-13 DIAGNOSIS — G8929 Other chronic pain: Secondary | ICD-10-CM | POA: Diagnosis not present

## 2018-02-15 DIAGNOSIS — L719 Rosacea, unspecified: Secondary | ICD-10-CM | POA: Diagnosis not present

## 2018-02-15 DIAGNOSIS — L700 Acne vulgaris: Secondary | ICD-10-CM | POA: Diagnosis not present

## 2018-02-15 DIAGNOSIS — L738 Other specified follicular disorders: Secondary | ICD-10-CM | POA: Diagnosis not present

## 2018-02-28 DIAGNOSIS — F4310 Post-traumatic stress disorder, unspecified: Secondary | ICD-10-CM | POA: Diagnosis not present

## 2018-03-15 DIAGNOSIS — L700 Acne vulgaris: Secondary | ICD-10-CM | POA: Diagnosis not present

## 2018-03-15 DIAGNOSIS — L680 Hirsutism: Secondary | ICD-10-CM | POA: Diagnosis not present

## 2018-03-15 DIAGNOSIS — F4310 Post-traumatic stress disorder, unspecified: Secondary | ICD-10-CM | POA: Diagnosis not present

## 2018-04-05 DIAGNOSIS — F4310 Post-traumatic stress disorder, unspecified: Secondary | ICD-10-CM | POA: Diagnosis not present

## 2018-04-06 ENCOUNTER — Ambulatory Visit (INDEPENDENT_AMBULATORY_CARE_PROVIDER_SITE_OTHER): Payer: PPO | Admitting: Internal Medicine

## 2018-04-06 ENCOUNTER — Encounter: Payer: Self-pay | Admitting: Internal Medicine

## 2018-04-06 VITALS — BP 124/78 | HR 92 | Temp 98.0°F | Wt 170.0 lb

## 2018-04-06 DIAGNOSIS — M5441 Lumbago with sciatica, right side: Secondary | ICD-10-CM | POA: Diagnosis not present

## 2018-04-06 MED ORDER — PREDNISONE 10 MG PO TABS: ORAL_TABLET | ORAL | 0 refills | Status: DC

## 2018-04-06 MED ORDER — ORPHENADRINE CITRATE ER 100 MG PO TB12: 100.0000 mg | ORAL_TABLET | Freq: Two times a day (BID) | ORAL | 0 refills | Status: DC

## 2018-04-06 MED ORDER — KETOROLAC TROMETHAMINE 30 MG/ML IJ SOLN
30.0000 mg | Freq: Once | INTRAMUSCULAR | Status: AC
  Administered 2018-04-06: 30 mg via INTRAMUSCULAR

## 2018-04-06 NOTE — Addendum Note (Signed)
Addended by: Lurlean Nanny on: 04/06/2018 04:39 PM   Modules accepted: Orders

## 2018-04-06 NOTE — Patient Instructions (Signed)
Sciatica Rehab  Ask your health care provider which exercises are safe for you. Do exercises exactly as told by your health care provider and adjust them as directed. It is normal to feel mild stretching, pulling, tightness, or discomfort as you do these exercises, but you should stop right away if you feel sudden pain or your pain gets worse.Do not begin these exercises until told by your health care provider.  Stretching and range of motion exercises  These exercises warm up your muscles and joints and improve the movement and flexibility of your hips and your back. These exercises also help to relieve pain, numbness, and tingling.  Exercise A: Sciatic nerve glide  1. Sit in a chair with your head facing down toward your chest. Place your hands behind your back. Let your shoulders slump forward.  2. Slowly straighten one of your knees while you tilt your head back as if you are looking toward the ceiling. Only straighten your leg as far as you can without making your symptoms worse.  3. Hold for __________ seconds.  4. Slowly return to the starting position.  5. Repeat with your other leg.  Repeat __________ times. Complete this exercise __________ times a day.  Exercise B: Knee to chest with hip adduction and internal rotation    1. Lie on your back on a firm surface with both legs straight.  2. Bend one of your knees and move it up toward your chest until you feel a gentle stretch in your lower back and buttock. Then, move your knee toward the shoulder that is on the opposite side from your leg.  ? Hold your leg in this position by holding onto the front of your knee.  3. Hold for __________ seconds.  4. Slowly return to the starting position.  5. Repeat with your other leg.  Repeat __________ times. Complete this exercise __________ times a day.  Exercise C: Prone extension on elbows    1. Lie on your abdomen on a firm surface. A bed may be too soft for this exercise.  2. Prop yourself up on your  elbows.  3. Use your arms to help lift your chest up until you feel a gentle stretch in your abdomen and your lower back.  ? This will place some of your body weight on your elbows. If this is uncomfortable, try stacking pillows under your chest.  ? Your hips should stay down, against the surface that you are lying on. Keep your hip and back muscles relaxed.  4. Hold for __________ seconds.  5. Slowly relax your upper body and return to the starting position.  Repeat __________ times. Complete this exercise __________ times a day.  Strengthening exercises  These exercises build strength and endurance in your back. Endurance is the ability to use your muscles for a long time, even after they get tired.  Exercise D: Pelvic tilt  1. Lie on your back on a firm surface. Bend your knees and keep your feet flat.  2. Tense your abdominal muscles. Tip your pelvis up toward the ceiling and flatten your lower back into the floor.  ? To help with this exercise, you may place a small towel under your lower back and try to push your back into the towel.  3. Hold for __________ seconds.  4. Let your muscles relax completely before you repeat this exercise.  Repeat __________ times. Complete this exercise __________ times a day.  Exercise E: Alternating arm and leg raises      1. Get on your hands and knees on a firm surface. If you are on a hard floor, you may want to use padding to cushion your knees, such as an exercise mat.  2. Line up your arms and legs. Your hands should be below your shoulders, and your knees should be below your hips.  3. Lift your left leg behind you. At the same time, raise your right arm and straighten it in front of you.  ? Do not lift your leg higher than your hip.  ? Do not lift your arm higher than your shoulder.  ? Keep your abdominal and back muscles tight.  ? Keep your hips facing the ground.  ? Do not arch your back.  ? Keep your balance carefully, and do not hold your breath.  4. Hold for  __________ seconds.  5. Slowly return to the starting position and repeat with your right leg and your left arm.  Repeat __________ times. Complete this exercise __________ times a day.  Posture and body mechanics    Body mechanics refers to the movements and positions of your body while you do your daily activities. Posture is part of body mechanics. Good posture and healthy body mechanics can help to relieve stress in your body's tissues and joints. Good posture means that your spine is in its natural S-curve position (your spine is neutral), your shoulders are pulled back slightly, and your head is not tipped forward. The following are general guidelines for applying improved posture and body mechanics to your everyday activities.  Standing     When standing, keep your spine neutral and your feet about hip-width apart. Keep a slight bend in your knees. Your ears, shoulders, and hips should line up.   When you do a task in which you stand in one place for a long time, place one foot up on a stable object that is 2-4 inches (5-10 cm) high, such as a footstool. This helps keep your spine neutral.  Sitting     When sitting, keep your spine neutral and keep your feet flat on the floor. Use a footrest, if necessary, and keep your thighs parallel to the floor. Avoid rounding your shoulders, and avoid tilting your head forward.   When working at a desk or a computer, keep your desk at a height where your hands are slightly lower than your elbows. Slide your chair under your desk so you are close enough to maintain good posture.   When working at a computer, place your monitor at a height where you are looking straight ahead and you do not have to tilt your head forward or downward to look at the screen.  Resting     When lying down and resting, avoid positions that are most painful for you.   If you have pain with activities such as sitting, bending, stooping, or squatting (flexion-based activities), lie in a  position in which your body does not bend very much. For example, avoid curling up on your side with your arms and knees near your chest (fetal position).   If you have pain with activities such as standing for a long time or reaching with your arms (extension-based activities), lie with your spine in a neutral position and bend your knees slightly. Try the following positions:  ? Lying on your side with a pillow between your knees.  ? Lying on your back with a pillow under your knees.  Lifting     When lifting   objects, keep your feet at least shoulder-width apart and tighten your abdominal muscles.   Bend your knees and hips and keep your spine neutral. It is important to lift using the strength of your legs, not your back. Do not lock your knees straight out.   Always ask for help to lift heavy or awkward objects.  This information is not intended to replace advice given to you by your health care provider. Make sure you discuss any questions you have with your health care provider.  Document Released: 02/28/2005 Document Revised: 11/05/2015 Document Reviewed: 11/14/2014  Elsevier Interactive Patient Education  2019 [employer]

## 2018-04-06 NOTE — Progress Notes (Signed)
Subjective:    Patient ID: Tanya [name], female    DOB: Mar 30, 1970, 48 y.o.   MRN: [id number]  HPI  Pt presents to the clinic today with c/o right low back pain s/p fall. The fall occurred on Christmas Day. She fell down some steps, landing on her right side. She describes the back pain as sharp and shooting. The pain radiates into her right leg and foot. She denies numbness, tingling, weakness or loss of bowel or bladder. She has tried Aleve and Ibuprofen 3 x day without relief. She has a history of complex regional pain syndrome, follows with pain management. She recently got a right groin injection about 3 weeks prior to her fall.  Review of Systems      Past Medical History:  Diagnosis Date  . Asthma   . Blood in stool   . Chickenpox   . Depression   . Frequent headaches   . Heart murmur   . Hypertension   . Hypothyroidism   . Mononucleosis   . Seasonal allergies   . UTI (urinary tract infection)     Current Outpatient Medications  Medication Sig Dispense Refill  . albuterol (PROVENTIL HFA;VENTOLIN HFA) 108 (90 Base) MCG/ACT inhaler INHALE 2 PUFFS INTO THE LUNGS EVERY 6 HOURS AS NEEDED FOR WHEEZING OR SHORTNESS OF BREATH 8.5 g 0  . ALPRAZOLAM XR 1 MG 24 hr tablet Take 1 mg by mouth 3 (three) times daily.     Marland Kitchen amphetamine-dextroamphetamine (ADDERALL XR) 20 MG 24 hr capsule Take 20 mg by mouth daily.     Marland Kitchen amphetamine-dextroamphetamine (ADDERALL) 20 MG tablet Take 20 mg by mouth 2 (two) times daily.    Marland Kitchen Bioflavonoid Products (GRAPE SEED PO) Take by mouth.    Marland Kitchen buPROPion (WELLBUTRIN XL) 150 MG 24 hr tablet Take 2 tablets by mouth every morning and 1 tablet every evening.    . cephALEXin (KEFLEX) 500 MG capsule Take 1 capsule (500 mg total) by mouth 2 (two) times daily. 14 capsule 0  . doxepin (SINEQUAN) 50 MG capsule 1 or 2 tablet by mouth at bedtime    . hydrochlorothiazide (HYDRODIURIL) 50 MG tablet TAKE ONE TABLET BY MOUTH ONE TIME DAILY  90 tablet 1  . levothyroxine  (SYNTHROID, LEVOTHROID) 75 MCG tablet TAKE 1 TABLET BY MOUTH EVERY MORNING ON AN EMPTY STOMACH WITH WATER. NO FOOD OR MEDICATIONS FOR 30 MINUTES. 90 tablet 3  . Misc Natural Products (GLUCOS-CHONDROIT-MSM COMPLEX PO) Take by mouth.    . orlistat (ALLI) 60 MG capsule Take 60 mg by mouth.    . tiaGABine (GABITRIL) 4 MG tablet Take 16 mg by mouth at bedtime.    . TURMERIC PO Take by mouth.    Marland Kitchen UNABLE TO FIND Med Name: Carb Blockers    . venlafaxine XR (EFFEXOR-XR) 150 MG 24 hr capsule Take 300 mg by mouth daily with breakfast.     . vitamin B-12 (CYANOCOBALAMIN) 1000 MCG tablet Take by mouth.     No current facility-administered medications for this visit.     Allergies  Allergen Reactions  . Hydrocodone-Acetaminophen Itching, Other (See Comments) and Hives    Other Reaction: Other reaction Other Reaction: Other reaction, hives   . Oxycodone-Acetaminophen Itching, Other (See Comments) and Hives    Other Reaction: Other reaction Other Reaction: Other reaction, hives   . Pertussis Vaccines     Family History  Problem Relation Age of Onset  . Depression Mother   . Learning disabilities Mother   .  Depression Father   . Early death Father   . Depression Sister   . Drug abuse Sister   . Early death Sister   . Mental illness Sister   . Miscarriages / Stillbirths Sister   . Multiple sclerosis Sister   . Alcohol abuse Daughter   . Depression Daughter   . Diabetes Daughter   . Learning disabilities Daughter   . Depression Son   . Learning disabilities Son   . Cancer Maternal Grandmother   . Hearing loss Maternal Grandmother   . Breast cancer Maternal Grandmother   . Alcohol abuse Paternal Grandmother   . Heart disease Paternal Grandmother   . Hyperlipidemia Paternal Grandmother   . Hypertension Paternal Grandmother   . Kidney disease Paternal Grandmother   . ADD / ADHD Paternal Grandfather   . COPD Paternal Grandfather   . Kidney disease Paternal Grandfather   . Hypertension  Paternal Grandfather   . Hyperlipidemia Paternal Grandfather   . Depression Son     Social History   Socioeconomic History  . Marital status: Married    Spouse name: Not on file  . Number of children: Not on file  . Years of education: Not on file  . Highest education level: Not on file  Occupational History  . Not on file  Social Needs  . Financial resource strain: Not on file  . Food insecurity:    Worry: Not on file    Inability: Not on file  . Transportation needs:    Medical: Not on file    Non-medical: Not on file  Tobacco Use  . Smoking status: Never Smoker  . Smokeless tobacco: Never Used  Substance and Sexual Activity  . Alcohol use: Yes  . Drug use: Not on file  . Sexual activity: Not on file  Lifestyle  . Physical activity:    Days per week: Not on file    Minutes per session: Not on file  . Stress: Not on file  Relationships  . Social connections:    Talks on phone: Not on file    Gets together: Not on file    Attends religious service: Not on file    Active member of club or organization: Not on file    Attends meetings of clubs or organizations: Not on file    Relationship status: Not on file  . Intimate partner violence:    Fear of current or ex partner: Not on file    Emotionally abused: Not on file    Physically abused: Not on file    Forced sexual activity: Not on file  Other Topics Concern  . Not on file  Social History Narrative   Married.   Disabled.   Once worked as an Therapist, sports.     Constitutional: Denies fever, malaise, fatigue, headache or abrupt weight changes.  Musculoskeletal: Pt reports back pain. Denies decrease in range of motion, difficulty with gait, or joint pain and swelling.  Neurological: Denies numbness, tingling, weakness or problems with balance and coordination.    No other specific complaints in a complete review of systems (except as listed in HPI above).  Objective:   Physical Exam   BP 124/78   Pulse 92   Temp  98 F (36.7 C) (Oral)   Wt 170 lb (77.1 kg)   SpO2 97%   BMI 33.20 kg/m  Wt Readings from Last 3 Encounters:  04/06/18 170 lb (77.1 kg)  12/21/17 169 lb 12 oz (77 kg)  12/19/17 173  lb 4 oz (78.6 kg)    General: Appears her stated age, obese, in NAD. Musculoskeletal: Normal flexion and extension of the spine. Pain with bilateral rotation. No bony tenderness over the spine. Pain with palpation of the right paralumbar muscles. Normal flexion, extension, abduction, adduction, internal and external rotation of the right hip. Strength 5/5 BLE. No difficulty with gait.  Neurological: Alert and oriented. Negative SLR on the right.   BMET    Component Value Date/Time   NA 137 11/17/2017 1439   K 3.6 11/17/2017 1439   CL 97 11/17/2017 1439   CO2 34 (H) 11/17/2017 1439   GLUCOSE 142 (H) 11/17/2017 1439   BUN 15 11/17/2017 1439   CREATININE 0.77 11/17/2017 1439   CALCIUM 9.8 11/17/2017 1439   GFRNONAA >60 09/19/2007 1302   GFRAA  09/19/2007 1302    >60        The eGFR has been calculated using the MDRD equation. This calculation has not been validated in all clinical    Lipid Panel     Component Value Date/Time   CHOL 166 12/21/2017 1012   TRIG 64.0 12/21/2017 1012   HDL 73.30 12/21/2017 1012   CHOLHDL 2 12/21/2017 1012   VLDL 12.8 12/21/2017 1012   LDLCALC 80 12/21/2017 1012    CBC    Component Value Date/Time   WBC 5.7 09/19/2007 1302   RBC 4.21 09/19/2007 1302   HGB 13.6 09/19/2007 1302   HCT 39.1 09/19/2007 1302   PLT 250 09/19/2007 1302   MCV 93.0 09/19/2007 1302   MCHC 34.9 09/19/2007 1302   RDW 10.8 (L) 09/19/2007 1302    Hgb A1C Lab Results  Component Value Date   HGBA1C 5.7 11/17/2017           Assessment & Plan:   Acute Right Side Low Back Pain With Right Side Sciatica:  Toradol 30 mg IM today RX for Pred Taper x 9 days RX for Norflex 100 mg BID- sedation caution given Ice and massage may be helpful Stretching exercises given  Return  precautions disucssed Webb Silversmith, NP

## 2018-04-09 DIAGNOSIS — Z79899 Other long term (current) drug therapy: Secondary | ICD-10-CM | POA: Diagnosis not present

## 2018-04-09 DIAGNOSIS — G894 Chronic pain syndrome: Secondary | ICD-10-CM | POA: Diagnosis not present

## 2018-04-09 DIAGNOSIS — M25551 Pain in right hip: Secondary | ICD-10-CM | POA: Diagnosis not present

## 2018-04-09 DIAGNOSIS — M5417 Radiculopathy, lumbosacral region: Secondary | ICD-10-CM | POA: Diagnosis not present

## 2018-04-09 DIAGNOSIS — Z5181 Encounter for therapeutic drug level monitoring: Secondary | ICD-10-CM | POA: Diagnosis not present

## 2018-04-09 DIAGNOSIS — G90519 Complex regional pain syndrome I of unspecified upper limb: Secondary | ICD-10-CM | POA: Diagnosis not present

## 2018-04-20 DIAGNOSIS — F419 Anxiety disorder, unspecified: Secondary | ICD-10-CM | POA: Diagnosis not present

## 2018-04-20 DIAGNOSIS — E248 Other Cushing's syndrome: Secondary | ICD-10-CM | POA: Diagnosis not present

## 2018-04-20 DIAGNOSIS — E669 Obesity, unspecified: Secondary | ICD-10-CM | POA: Diagnosis not present

## 2018-04-20 DIAGNOSIS — F341 Dysthymic disorder: Secondary | ICD-10-CM | POA: Diagnosis not present

## 2018-04-20 DIAGNOSIS — E7800 Pure hypercholesterolemia, unspecified: Secondary | ICD-10-CM | POA: Diagnosis not present

## 2018-04-20 DIAGNOSIS — L680 Hirsutism: Secondary | ICD-10-CM | POA: Diagnosis not present

## 2018-04-20 DIAGNOSIS — J45909 Unspecified asthma, uncomplicated: Secondary | ICD-10-CM | POA: Diagnosis not present

## 2018-04-20 DIAGNOSIS — E039 Hypothyroidism, unspecified: Secondary | ICD-10-CM | POA: Diagnosis not present

## 2018-04-20 DIAGNOSIS — G9050 Complex regional pain syndrome I, unspecified: Secondary | ICD-10-CM | POA: Diagnosis not present

## 2018-04-20 DIAGNOSIS — F909 Attention-deficit hyperactivity disorder, unspecified type: Secondary | ICD-10-CM | POA: Diagnosis not present

## 2018-04-20 DIAGNOSIS — E1165 Type 2 diabetes mellitus with hyperglycemia: Secondary | ICD-10-CM | POA: Diagnosis not present

## 2018-04-23 DIAGNOSIS — E248 Other Cushing's syndrome: Secondary | ICD-10-CM | POA: Diagnosis not present

## 2018-04-26 DIAGNOSIS — F4310 Post-traumatic stress disorder, unspecified: Secondary | ICD-10-CM | POA: Diagnosis not present

## 2018-05-02 DIAGNOSIS — M5417 Radiculopathy, lumbosacral region: Secondary | ICD-10-CM | POA: Diagnosis not present

## 2018-05-02 DIAGNOSIS — M5416 Radiculopathy, lumbar region: Secondary | ICD-10-CM | POA: Diagnosis not present

## 2018-05-02 DIAGNOSIS — Z79899 Other long term (current) drug therapy: Secondary | ICD-10-CM | POA: Diagnosis not present

## 2018-05-02 DIAGNOSIS — G894 Chronic pain syndrome: Secondary | ICD-10-CM | POA: Diagnosis not present

## 2018-05-02 DIAGNOSIS — Z5181 Encounter for therapeutic drug level monitoring: Secondary | ICD-10-CM | POA: Diagnosis not present

## 2018-05-03 ENCOUNTER — Other Ambulatory Visit: Payer: Self-pay | Admitting: Anesthesiology

## 2018-05-07 ENCOUNTER — Other Ambulatory Visit: Payer: Self-pay | Admitting: Anesthesiology

## 2018-05-07 DIAGNOSIS — M5416 Radiculopathy, lumbar region: Secondary | ICD-10-CM

## 2018-05-09 DIAGNOSIS — F909 Attention-deficit hyperactivity disorder, unspecified type: Secondary | ICD-10-CM | POA: Diagnosis not present

## 2018-05-09 DIAGNOSIS — G9050 Complex regional pain syndrome I, unspecified: Secondary | ICD-10-CM | POA: Diagnosis not present

## 2018-05-09 DIAGNOSIS — E669 Obesity, unspecified: Secondary | ICD-10-CM | POA: Diagnosis not present

## 2018-05-09 DIAGNOSIS — E039 Hypothyroidism, unspecified: Secondary | ICD-10-CM | POA: Diagnosis not present

## 2018-05-09 DIAGNOSIS — R7302 Impaired glucose tolerance (oral): Secondary | ICD-10-CM | POA: Diagnosis not present

## 2018-05-09 DIAGNOSIS — E248 Other Cushing's syndrome: Secondary | ICD-10-CM | POA: Diagnosis not present

## 2018-05-09 DIAGNOSIS — J45909 Unspecified asthma, uncomplicated: Secondary | ICD-10-CM | POA: Diagnosis not present

## 2018-05-09 DIAGNOSIS — E1165 Type 2 diabetes mellitus with hyperglycemia: Secondary | ICD-10-CM | POA: Diagnosis not present

## 2018-05-09 DIAGNOSIS — F419 Anxiety disorder, unspecified: Secondary | ICD-10-CM | POA: Diagnosis not present

## 2018-05-09 DIAGNOSIS — Z6835 Body mass index (BMI) 35.0-35.9, adult: Secondary | ICD-10-CM | POA: Diagnosis not present

## 2018-05-09 DIAGNOSIS — F341 Dysthymic disorder: Secondary | ICD-10-CM | POA: Diagnosis not present

## 2018-05-09 DIAGNOSIS — L680 Hirsutism: Secondary | ICD-10-CM | POA: Diagnosis not present

## 2018-05-10 DIAGNOSIS — L700 Acne vulgaris: Secondary | ICD-10-CM | POA: Diagnosis not present

## 2018-05-10 DIAGNOSIS — L853 Xerosis cutis: Secondary | ICD-10-CM | POA: Diagnosis not present

## 2018-05-15 DIAGNOSIS — M4726 Other spondylosis with radiculopathy, lumbar region: Secondary | ICD-10-CM | POA: Diagnosis not present

## 2018-05-15 DIAGNOSIS — M5410 Radiculopathy, site unspecified: Secondary | ICD-10-CM | POA: Diagnosis not present

## 2018-05-15 DIAGNOSIS — M5126 Other intervertebral disc displacement, lumbar region: Secondary | ICD-10-CM | POA: Diagnosis not present

## 2018-05-15 DIAGNOSIS — M47816 Spondylosis without myelopathy or radiculopathy, lumbar region: Secondary | ICD-10-CM | POA: Diagnosis not present

## 2018-05-15 DIAGNOSIS — M5116 Intervertebral disc disorders with radiculopathy, lumbar region: Secondary | ICD-10-CM | POA: Diagnosis not present

## 2018-05-15 DIAGNOSIS — M4807 Spinal stenosis, lumbosacral region: Secondary | ICD-10-CM | POA: Diagnosis not present

## 2018-05-17 DIAGNOSIS — G894 Chronic pain syndrome: Secondary | ICD-10-CM | POA: Diagnosis not present

## 2018-05-17 DIAGNOSIS — G90519 Complex regional pain syndrome I of unspecified upper limb: Secondary | ICD-10-CM | POA: Diagnosis not present

## 2018-05-17 DIAGNOSIS — M25551 Pain in right hip: Secondary | ICD-10-CM | POA: Diagnosis not present

## 2018-05-17 DIAGNOSIS — M5416 Radiculopathy, lumbar region: Secondary | ICD-10-CM | POA: Diagnosis not present

## 2018-05-17 DIAGNOSIS — Z5181 Encounter for therapeutic drug level monitoring: Secondary | ICD-10-CM | POA: Diagnosis not present

## 2018-05-17 DIAGNOSIS — M5417 Radiculopathy, lumbosacral region: Secondary | ICD-10-CM | POA: Diagnosis not present

## 2018-05-17 DIAGNOSIS — Z79899 Other long term (current) drug therapy: Secondary | ICD-10-CM | POA: Diagnosis not present

## 2018-06-12 DIAGNOSIS — G90519 Complex regional pain syndrome I of unspecified upper limb: Secondary | ICD-10-CM | POA: Diagnosis not present

## 2018-06-12 DIAGNOSIS — M5417 Radiculopathy, lumbosacral region: Secondary | ICD-10-CM | POA: Diagnosis not present

## 2018-06-12 DIAGNOSIS — G894 Chronic pain syndrome: Secondary | ICD-10-CM | POA: Diagnosis not present

## 2018-06-13 DIAGNOSIS — E785 Hyperlipidemia, unspecified: Secondary | ICD-10-CM | POA: Diagnosis not present

## 2018-06-13 DIAGNOSIS — G8929 Other chronic pain: Secondary | ICD-10-CM | POA: Diagnosis not present

## 2018-06-13 DIAGNOSIS — R310 Gross hematuria: Secondary | ICD-10-CM | POA: Diagnosis not present

## 2018-06-13 DIAGNOSIS — M069 Rheumatoid arthritis, unspecified: Secondary | ICD-10-CM | POA: Diagnosis not present

## 2018-06-13 DIAGNOSIS — Z23 Encounter for immunization: Secondary | ICD-10-CM | POA: Diagnosis not present

## 2018-06-13 DIAGNOSIS — R1013 Epigastric pain: Secondary | ICD-10-CM | POA: Diagnosis not present

## 2018-06-13 DIAGNOSIS — I4891 Unspecified atrial fibrillation: Secondary | ICD-10-CM | POA: Diagnosis not present

## 2018-06-13 DIAGNOSIS — I129 Hypertensive chronic kidney disease with stage 1 through stage 4 chronic kidney disease, or unspecified chronic kidney disease: Secondary | ICD-10-CM | POA: Diagnosis not present

## 2018-06-13 DIAGNOSIS — C61 Malignant neoplasm of prostate: Secondary | ICD-10-CM | POA: Diagnosis not present

## 2018-06-13 DIAGNOSIS — M5136 Other intervertebral disc degeneration, lumbar region: Secondary | ICD-10-CM | POA: Diagnosis not present

## 2018-06-13 DIAGNOSIS — M545 Low back pain: Secondary | ICD-10-CM | POA: Diagnosis not present

## 2018-06-13 DIAGNOSIS — R1310 Dysphagia, unspecified: Secondary | ICD-10-CM | POA: Diagnosis not present

## 2018-06-13 DIAGNOSIS — D631 Anemia in chronic kidney disease: Secondary | ICD-10-CM | POA: Diagnosis not present

## 2018-06-13 DIAGNOSIS — Z79899 Other long term (current) drug therapy: Secondary | ICD-10-CM | POA: Diagnosis not present

## 2018-06-13 DIAGNOSIS — N183 Chronic kidney disease, stage 3 (moderate): Secondary | ICD-10-CM | POA: Diagnosis not present

## 2018-06-13 DIAGNOSIS — R634 Abnormal weight loss: Secondary | ICD-10-CM | POA: Diagnosis not present

## 2018-06-13 DIAGNOSIS — Z794 Long term (current) use of insulin: Secondary | ICD-10-CM | POA: Diagnosis not present

## 2018-06-13 DIAGNOSIS — E1122 Type 2 diabetes mellitus with diabetic chronic kidney disease: Secondary | ICD-10-CM | POA: Diagnosis not present

## 2018-06-13 DIAGNOSIS — M810 Age-related osteoporosis without current pathological fracture: Secondary | ICD-10-CM | POA: Diagnosis not present

## 2018-06-13 DIAGNOSIS — H353231 Exudative age-related macular degeneration, bilateral, with active choroidal neovascularization: Secondary | ICD-10-CM | POA: Diagnosis not present

## 2018-06-13 DIAGNOSIS — H35033 Hypertensive retinopathy, bilateral: Secondary | ICD-10-CM | POA: Diagnosis not present

## 2018-06-13 DIAGNOSIS — Z7189 Other specified counseling: Secondary | ICD-10-CM | POA: Diagnosis not present

## 2018-06-13 DIAGNOSIS — I10 Essential (primary) hypertension: Secondary | ICD-10-CM | POA: Diagnosis not present

## 2018-06-13 DIAGNOSIS — H43813 Vitreous degeneration, bilateral: Secondary | ICD-10-CM | POA: Diagnosis not present

## 2018-06-13 DIAGNOSIS — E1140 Type 2 diabetes mellitus with diabetic neuropathy, unspecified: Secondary | ICD-10-CM | POA: Diagnosis not present

## 2018-06-13 DIAGNOSIS — R627 Adult failure to thrive: Secondary | ICD-10-CM | POA: Diagnosis not present

## 2018-06-13 DIAGNOSIS — E1165 Type 2 diabetes mellitus with hyperglycemia: Secondary | ICD-10-CM | POA: Diagnosis not present

## 2018-06-13 DIAGNOSIS — F4310 Post-traumatic stress disorder, unspecified: Secondary | ICD-10-CM | POA: Diagnosis not present

## 2018-06-13 DIAGNOSIS — Z87891 Personal history of nicotine dependence: Secondary | ICD-10-CM | POA: Diagnosis not present

## 2018-06-20 DIAGNOSIS — G9050 Complex regional pain syndrome I, unspecified: Secondary | ICD-10-CM | POA: Diagnosis not present

## 2018-06-20 DIAGNOSIS — F909 Attention-deficit hyperactivity disorder, unspecified type: Secondary | ICD-10-CM | POA: Diagnosis not present

## 2018-06-20 DIAGNOSIS — J45909 Unspecified asthma, uncomplicated: Secondary | ICD-10-CM | POA: Diagnosis not present

## 2018-06-20 DIAGNOSIS — E248 Other Cushing's syndrome: Secondary | ICD-10-CM | POA: Diagnosis not present

## 2018-06-20 DIAGNOSIS — E1165 Type 2 diabetes mellitus with hyperglycemia: Secondary | ICD-10-CM | POA: Diagnosis not present

## 2018-06-20 DIAGNOSIS — F419 Anxiety disorder, unspecified: Secondary | ICD-10-CM | POA: Diagnosis not present

## 2018-06-20 DIAGNOSIS — F341 Dysthymic disorder: Secondary | ICD-10-CM | POA: Diagnosis not present

## 2018-06-20 DIAGNOSIS — M1711 Unilateral primary osteoarthritis, right knee: Secondary | ICD-10-CM | POA: Diagnosis not present

## 2018-06-20 DIAGNOSIS — E669 Obesity, unspecified: Secondary | ICD-10-CM | POA: Diagnosis not present

## 2018-06-20 DIAGNOSIS — L680 Hirsutism: Secondary | ICD-10-CM | POA: Diagnosis not present

## 2018-06-20 DIAGNOSIS — E039 Hypothyroidism, unspecified: Secondary | ICD-10-CM | POA: Diagnosis not present

## 2018-06-21 DIAGNOSIS — L689 Hypertrichosis, unspecified: Secondary | ICD-10-CM | POA: Diagnosis not present

## 2018-06-21 DIAGNOSIS — L700 Acne vulgaris: Secondary | ICD-10-CM | POA: Diagnosis not present

## 2018-06-28 DIAGNOSIS — F4312 Post-traumatic stress disorder, chronic: Secondary | ICD-10-CM | POA: Diagnosis not present

## 2018-06-28 DIAGNOSIS — F900 Attention-deficit hyperactivity disorder, predominantly inattentive type: Secondary | ICD-10-CM | POA: Diagnosis not present

## 2018-06-28 DIAGNOSIS — F4323 Adjustment disorder with mixed anxiety and depressed mood: Secondary | ICD-10-CM | POA: Diagnosis not present

## 2018-07-11 DIAGNOSIS — M5417 Radiculopathy, lumbosacral region: Secondary | ICD-10-CM | POA: Diagnosis not present

## 2018-07-25 ENCOUNTER — Other Ambulatory Visit: Payer: Self-pay | Admitting: Primary Care

## 2018-07-25 DIAGNOSIS — G9050 Complex regional pain syndrome I, unspecified: Secondary | ICD-10-CM

## 2018-08-01 DIAGNOSIS — F900 Attention-deficit hyperactivity disorder, predominantly inattentive type: Secondary | ICD-10-CM | POA: Diagnosis not present

## 2018-08-01 DIAGNOSIS — F4312 Post-traumatic stress disorder, chronic: Secondary | ICD-10-CM | POA: Diagnosis not present

## 2018-08-01 DIAGNOSIS — F4323 Adjustment disorder with mixed anxiety and depressed mood: Secondary | ICD-10-CM | POA: Diagnosis not present

## 2018-08-16 DIAGNOSIS — F3341 Major depressive disorder, recurrent, in partial remission: Secondary | ICD-10-CM | POA: Diagnosis not present

## 2018-08-16 DIAGNOSIS — F4312 Post-traumatic stress disorder, chronic: Secondary | ICD-10-CM | POA: Diagnosis not present

## 2018-08-16 DIAGNOSIS — F900 Attention-deficit hyperactivity disorder, predominantly inattentive type: Secondary | ICD-10-CM | POA: Diagnosis not present

## 2018-08-21 DIAGNOSIS — F4312 Post-traumatic stress disorder, chronic: Secondary | ICD-10-CM | POA: Diagnosis not present

## 2018-08-21 DIAGNOSIS — F900 Attention-deficit hyperactivity disorder, predominantly inattentive type: Secondary | ICD-10-CM | POA: Diagnosis not present

## 2018-08-21 DIAGNOSIS — F3341 Major depressive disorder, recurrent, in partial remission: Secondary | ICD-10-CM | POA: Diagnosis not present

## 2018-08-24 DIAGNOSIS — M545 Low back pain: Secondary | ICD-10-CM | POA: Diagnosis not present

## 2018-08-24 DIAGNOSIS — M5416 Radiculopathy, lumbar region: Secondary | ICD-10-CM | POA: Diagnosis not present

## 2018-08-29 DIAGNOSIS — F4312 Post-traumatic stress disorder, chronic: Secondary | ICD-10-CM | POA: Diagnosis not present

## 2018-08-29 DIAGNOSIS — F3341 Major depressive disorder, recurrent, in partial remission: Secondary | ICD-10-CM | POA: Diagnosis not present

## 2018-08-29 DIAGNOSIS — F900 Attention-deficit hyperactivity disorder, predominantly inattentive type: Secondary | ICD-10-CM | POA: Diagnosis not present

## 2018-08-30 DIAGNOSIS — M5416 Radiculopathy, lumbar region: Secondary | ICD-10-CM | POA: Diagnosis not present

## 2018-09-07 DIAGNOSIS — M545 Low back pain: Secondary | ICD-10-CM | POA: Diagnosis not present

## 2018-09-07 DIAGNOSIS — M5416 Radiculopathy, lumbar region: Secondary | ICD-10-CM | POA: Diagnosis not present

## 2018-09-13 DIAGNOSIS — F3341 Major depressive disorder, recurrent, in partial remission: Secondary | ICD-10-CM | POA: Diagnosis not present

## 2018-09-13 DIAGNOSIS — F4312 Post-traumatic stress disorder, chronic: Secondary | ICD-10-CM | POA: Diagnosis not present

## 2018-09-13 DIAGNOSIS — F900 Attention-deficit hyperactivity disorder, predominantly inattentive type: Secondary | ICD-10-CM | POA: Diagnosis not present

## 2018-09-18 ENCOUNTER — Other Ambulatory Visit: Payer: Self-pay | Admitting: Orthopedic Surgery

## 2018-09-18 DIAGNOSIS — M545 Low back pain, unspecified: Secondary | ICD-10-CM

## 2018-09-21 DIAGNOSIS — E1165 Type 2 diabetes mellitus with hyperglycemia: Secondary | ICD-10-CM | POA: Diagnosis not present

## 2018-09-21 DIAGNOSIS — R601 Generalized edema: Secondary | ICD-10-CM | POA: Diagnosis not present

## 2018-09-21 DIAGNOSIS — L680 Hirsutism: Secondary | ICD-10-CM | POA: Diagnosis not present

## 2018-09-21 DIAGNOSIS — E039 Hypothyroidism, unspecified: Secondary | ICD-10-CM | POA: Diagnosis not present

## 2018-09-21 DIAGNOSIS — E669 Obesity, unspecified: Secondary | ICD-10-CM | POA: Diagnosis not present

## 2018-09-21 DIAGNOSIS — R0602 Shortness of breath: Secondary | ICD-10-CM | POA: Diagnosis not present

## 2018-09-25 DIAGNOSIS — M5136 Other intervertebral disc degeneration, lumbar region: Secondary | ICD-10-CM | POA: Diagnosis not present

## 2018-09-25 DIAGNOSIS — M5116 Intervertebral disc disorders with radiculopathy, lumbar region: Secondary | ICD-10-CM | POA: Diagnosis not present

## 2018-09-25 DIAGNOSIS — M4726 Other spondylosis with radiculopathy, lumbar region: Secondary | ICD-10-CM | POA: Diagnosis not present

## 2018-09-25 DIAGNOSIS — M5117 Intervertebral disc disorders with radiculopathy, lumbosacral region: Secondary | ICD-10-CM | POA: Diagnosis not present

## 2018-09-25 DIAGNOSIS — M5126 Other intervertebral disc displacement, lumbar region: Secondary | ICD-10-CM | POA: Diagnosis not present

## 2018-09-25 DIAGNOSIS — M5127 Other intervertebral disc displacement, lumbosacral region: Secondary | ICD-10-CM | POA: Diagnosis not present

## 2018-09-25 DIAGNOSIS — M5137 Other intervertebral disc degeneration, lumbosacral region: Secondary | ICD-10-CM | POA: Diagnosis not present

## 2018-09-27 DIAGNOSIS — F341 Dysthymic disorder: Secondary | ICD-10-CM | POA: Diagnosis not present

## 2018-09-27 DIAGNOSIS — F419 Anxiety disorder, unspecified: Secondary | ICD-10-CM | POA: Diagnosis not present

## 2018-09-27 DIAGNOSIS — E1165 Type 2 diabetes mellitus with hyperglycemia: Secondary | ICD-10-CM | POA: Diagnosis not present

## 2018-09-27 DIAGNOSIS — J45909 Unspecified asthma, uncomplicated: Secondary | ICD-10-CM | POA: Diagnosis not present

## 2018-09-27 DIAGNOSIS — Z6833 Body mass index (BMI) 33.0-33.9, adult: Secondary | ICD-10-CM | POA: Diagnosis not present

## 2018-09-27 DIAGNOSIS — E669 Obesity, unspecified: Secondary | ICD-10-CM | POA: Diagnosis not present

## 2018-09-27 DIAGNOSIS — L680 Hirsutism: Secondary | ICD-10-CM | POA: Diagnosis not present

## 2018-09-27 DIAGNOSIS — G9050 Complex regional pain syndrome I, unspecified: Secondary | ICD-10-CM | POA: Diagnosis not present

## 2018-09-27 DIAGNOSIS — R7302 Impaired glucose tolerance (oral): Secondary | ICD-10-CM | POA: Diagnosis not present

## 2018-09-27 DIAGNOSIS — E248 Other Cushing's syndrome: Secondary | ICD-10-CM | POA: Diagnosis not present

## 2018-09-27 DIAGNOSIS — F909 Attention-deficit hyperactivity disorder, unspecified type: Secondary | ICD-10-CM | POA: Diagnosis not present

## 2018-09-27 DIAGNOSIS — E039 Hypothyroidism, unspecified: Secondary | ICD-10-CM | POA: Diagnosis not present

## 2018-10-01 DIAGNOSIS — M5416 Radiculopathy, lumbar region: Secondary | ICD-10-CM | POA: Diagnosis not present

## 2018-10-01 DIAGNOSIS — M545 Low back pain: Secondary | ICD-10-CM | POA: Diagnosis not present

## 2018-10-04 DIAGNOSIS — F4312 Post-traumatic stress disorder, chronic: Secondary | ICD-10-CM | POA: Diagnosis not present

## 2018-10-04 DIAGNOSIS — F3341 Major depressive disorder, recurrent, in partial remission: Secondary | ICD-10-CM | POA: Diagnosis not present

## 2018-10-04 DIAGNOSIS — G90511 Complex regional pain syndrome I of right upper limb: Secondary | ICD-10-CM | POA: Diagnosis not present

## 2018-10-04 DIAGNOSIS — F900 Attention-deficit hyperactivity disorder, predominantly inattentive type: Secondary | ICD-10-CM | POA: Diagnosis not present

## 2018-10-11 DIAGNOSIS — M5416 Radiculopathy, lumbar region: Secondary | ICD-10-CM | POA: Diagnosis not present

## 2018-10-18 ENCOUNTER — Other Ambulatory Visit: Payer: Self-pay | Admitting: Primary Care

## 2018-10-18 DIAGNOSIS — E039 Hypothyroidism, unspecified: Secondary | ICD-10-CM

## 2018-10-19 ENCOUNTER — Other Ambulatory Visit: Payer: Self-pay

## 2018-10-19 DIAGNOSIS — G9050 Complex regional pain syndrome I, unspecified: Secondary | ICD-10-CM

## 2018-10-19 DIAGNOSIS — F3341 Major depressive disorder, recurrent, in partial remission: Secondary | ICD-10-CM | POA: Diagnosis not present

## 2018-10-19 DIAGNOSIS — F900 Attention-deficit hyperactivity disorder, predominantly inattentive type: Secondary | ICD-10-CM | POA: Diagnosis not present

## 2018-10-19 DIAGNOSIS — F4312 Post-traumatic stress disorder, chronic: Secondary | ICD-10-CM | POA: Diagnosis not present

## 2018-10-19 MED ORDER — HYDROCHLOROTHIAZIDE 50 MG PO TABS: 50.0000 mg | ORAL_TABLET | Freq: Every day | ORAL | 1 refills | Status: DC

## 2018-10-20 DIAGNOSIS — M9904 Segmental and somatic dysfunction of sacral region: Secondary | ICD-10-CM | POA: Diagnosis not present

## 2018-10-20 DIAGNOSIS — M5416 Radiculopathy, lumbar region: Secondary | ICD-10-CM | POA: Diagnosis not present

## 2018-10-20 DIAGNOSIS — M5136 Other intervertebral disc degeneration, lumbar region: Secondary | ICD-10-CM | POA: Diagnosis not present

## 2018-10-24 ENCOUNTER — Other Ambulatory Visit: Payer: Self-pay | Admitting: Orthopedic Surgery

## 2018-10-24 DIAGNOSIS — M5136 Other intervertebral disc degeneration, lumbar region: Secondary | ICD-10-CM

## 2018-10-30 ENCOUNTER — Ambulatory Visit
Admission: RE | Admit: 2018-10-30 | Discharge: 2018-10-30 | Disposition: A | Discharge status: Home / Self Care | Payer: PPO | Source: Ambulatory Visit | Attending: Orthopedic Surgery | Admitting: Orthopedic Surgery

## 2018-10-30 DIAGNOSIS — M5136 Other intervertebral disc degeneration, lumbar region: Secondary | ICD-10-CM

## 2018-10-30 DIAGNOSIS — M545 Low back pain: Secondary | ICD-10-CM | POA: Diagnosis not present

## 2018-10-30 DIAGNOSIS — G8929 Other chronic pain: Secondary | ICD-10-CM | POA: Diagnosis not present

## 2018-10-30 MED ORDER — METHYLPREDNISOLONE ACETATE 40 MG/ML INJ SUSP (RADIOLOG
120.0000 mg | Freq: Once | INTRAMUSCULAR | Status: AC
  Administered 2018-10-30: 120 mg via INTRA_ARTICULAR

## 2018-10-30 MED ORDER — DIAZEPAM 5 MG PO TABS
10.0000 mg | ORAL_TABLET | Freq: Once | ORAL | Status: AC
  Administered 2018-10-30: 11:00:00 5 mg via ORAL

## 2018-10-30 MED ORDER — IOPAMIDOL (ISOVUE-M 200) INJECTION 41%
1.0000 mL | Freq: Once | INTRAMUSCULAR | Status: AC
  Administered 2018-10-30: 1 mL via INTRA_ARTICULAR

## 2018-11-01 DIAGNOSIS — F900 Attention-deficit hyperactivity disorder, predominantly inattentive type: Secondary | ICD-10-CM | POA: Diagnosis not present

## 2018-11-01 DIAGNOSIS — F3341 Major depressive disorder, recurrent, in partial remission: Secondary | ICD-10-CM | POA: Diagnosis not present

## 2018-11-01 DIAGNOSIS — F4312 Post-traumatic stress disorder, chronic: Secondary | ICD-10-CM | POA: Diagnosis not present

## 2018-11-06 ENCOUNTER — Telehealth: Payer: Self-pay | Admitting: Primary Care

## 2018-11-06 DIAGNOSIS — M545 Low back pain: Secondary | ICD-10-CM | POA: Diagnosis not present

## 2018-11-06 DIAGNOSIS — M48061 Spinal stenosis, lumbar region without neurogenic claudication: Secondary | ICD-10-CM | POA: Diagnosis not present

## 2018-11-06 DIAGNOSIS — M5416 Radiculopathy, lumbar region: Secondary | ICD-10-CM | POA: Diagnosis not present

## 2018-11-06 DIAGNOSIS — M5136 Other intervertebral disc degeneration, lumbar region: Secondary | ICD-10-CM | POA: Diagnosis not present

## 2018-11-06 NOTE — Telephone Encounter (Signed)
Pt dropped off form to be filled out for [facility]. Placed in [place] tower.

## 2018-11-06 NOTE — Telephone Encounter (Signed)
Place form in [place] for review

## 2018-11-07 NOTE — Telephone Encounter (Signed)
Patient needs to be scheduled for preoperative clearance visit. Please schedule.

## 2018-11-07 NOTE — Telephone Encounter (Signed)
Pt left v/m that she had dropped off form prior to surgery and request form to be completed and faxed to the fax # on the form. Pt request cb when form has been completed; pt wants to pick up the original form.

## 2018-11-09 ENCOUNTER — Ambulatory Visit: Payer: Self-pay | Admitting: Orthopedic Surgery

## 2018-11-09 ENCOUNTER — Other Ambulatory Visit: Payer: Self-pay | Admitting: Primary Care

## 2018-11-09 ENCOUNTER — Ambulatory Visit (INDEPENDENT_AMBULATORY_CARE_PROVIDER_SITE_OTHER): Payer: PPO | Admitting: Primary Care

## 2018-11-09 ENCOUNTER — Encounter: Payer: Self-pay | Admitting: Primary Care

## 2018-11-09 ENCOUNTER — Other Ambulatory Visit: Payer: Self-pay

## 2018-11-09 VITALS — BP 120/82 | HR 98 | Temp 98.4°F | Ht 60.0 in | Wt 180.0 lb

## 2018-11-09 DIAGNOSIS — Z23 Encounter for immunization: Secondary | ICD-10-CM | POA: Diagnosis not present

## 2018-11-09 DIAGNOSIS — J4520 Mild intermittent asthma, uncomplicated: Secondary | ICD-10-CM

## 2018-11-09 DIAGNOSIS — Z01818 Encounter for other preprocedural examination: Secondary | ICD-10-CM

## 2018-11-09 DIAGNOSIS — E039 Hypothyroidism, unspecified: Secondary | ICD-10-CM

## 2018-11-09 HISTORY — DX: Encounter for other preprocedural examination: Z01.818

## 2018-11-09 NOTE — Assessment & Plan Note (Signed)
Patient requesting pneumovax given history of asthma and her upcoming hospital admission. This seems reasonable, vaccination provided.

## 2018-11-09 NOTE — Progress Notes (Signed)
Subjective:    Patient ID: Tanya [name], female    DOB: 1970-11-28, 48 y.o.   MRN: [id number]  HPI  Tanya [name] is a 48 year old female with a history of chronic back pain, complex regional pain syndrome type 1 who presents today for surgical clearance. She would also like a pneumonia vaccination given chronic asthma and upcoming surgery.   She will undergo lumbar decompression, L3-4 and L4-5 per Dr. Rolena Infante that is scheduled for November 21, 2018.   BP Readings from Last 3 Encounters:  11/09/18 120/82  10/30/18 138/84  04/06/18 124/78     Review of Systems  Constitutional: Negative for fever and unexpected weight change.  Eyes: Negative for visual disturbance.  Respiratory: Negative for shortness of breath.   Cardiovascular: Negative for chest pain.  Gastrointestinal: Negative for abdominal pain, nausea and vomiting.  Genitourinary: Negative for dysuria.  Musculoskeletal: Positive for arthralgias and back pain.  Neurological: Positive for numbness.       Past Medical History:  Diagnosis Date  . Asthma   . Blood in stool   . Chickenpox   . Depression   . Frequent headaches   . Heart murmur   . Hypertension   . Hypothyroidism   . Mononucleosis   . Seasonal allergies   . UTI (urinary tract infection)      Social History   Socioeconomic History  . Marital status: Married    Spouse name: Not on file  . Number of children: Not on file  . Years of education: Not on file  . Highest education level: Not on file  Occupational History  . Not on file  Social Needs  . Financial resource strain: Not on file  . Food insecurity    Worry: Not on file    Inability: Not on file  . Transportation needs    Medical: Not on file    Non-medical: Not on file  Tobacco Use  . Smoking status: Never Smoker  . Smokeless tobacco: Never Used  Substance and Sexual Activity  . Alcohol use: Yes  . Drug use: Not on file  . Sexual activity: Not on file  Lifestyle  . Physical  activity    Days per week: Not on file    Minutes per session: Not on file  . Stress: Not on file  Relationships  . Social Herbalist on phone: Not on file    Gets together: Not on file    Attends religious service: Not on file    Active member of club or organization: Not on file    Attends meetings of clubs or organizations: Not on file    Relationship status: Not on file  . Intimate partner violence    Fear of current or ex partner: Not on file    Emotionally abused: Not on file    Physically abused: Not on file    Forced sexual activity: Not on file  Other Topics Concern  . Not on file  Social History Narrative   Married.   Disabled.   Once worked as an Therapist, sports.    Past Surgical History:  Procedure Laterality Date  . ABDOMINAL HYSTERECTOMY  2009  . ANTERIOR AND POSTERIOR SPINAL FUSION    . AUGMENTATION MAMMAPLASTY Bilateral    lift done at time of augmentation  . BREAST ENHANCEMENT SURGERY  2008  . CARPAL TUNNEL RELEASE Right 2009  . [place], 2006  . LEEP  2000  .  SPINAL CORD STIMULATOR INSERTION  2017  . UTERINE FIBROID SURGERY  2005    Family History  Problem Relation Age of Onset  . Depression Mother   . Learning disabilities Mother   . Depression Father   . Early death Father   . Depression Sister   . Drug abuse Sister   . Early death Sister   . Mental illness Sister   . Miscarriages / Stillbirths Sister   . Multiple sclerosis Sister   . Alcohol abuse Daughter   . Depression Daughter   . Diabetes Daughter   . Learning disabilities Daughter   . Depression Son   . Learning disabilities Son   . Cancer Maternal Grandmother   . Hearing loss Maternal Grandmother   . Breast cancer Maternal Grandmother   . Alcohol abuse Paternal Grandmother   . Heart disease Paternal Grandmother   . Hyperlipidemia Paternal Grandmother   . Hypertension Paternal Grandmother   . Kidney disease Paternal Grandmother   . ADD / ADHD Paternal  Grandfather   . COPD Paternal Grandfather   . Kidney disease Paternal Grandfather   . Hypertension Paternal Grandfather   . Hyperlipidemia Paternal Grandfather   . Depression Son     Allergies  Allergen Reactions  . Hydrocodone-Acetaminophen Itching, Other (See Comments) and Hives    Other Reaction: Other reaction Other Reaction: Other reaction, hives   . Oxycodone-Acetaminophen Itching, Other (See Comments) and Hives    Other Reaction: Other reaction Other Reaction: Other reaction, hives   . Pertussis Vaccines     Current Outpatient Medications on File Prior to Visit  Medication Sig Dispense Refill  . albuterol (PROVENTIL HFA;VENTOLIN HFA) 108 (90 Base) MCG/ACT inhaler INHALE 2 PUFFS INTO THE LUNGS EVERY 6 HOURS AS NEEDED FOR WHEEZING OR SHORTNESS OF BREATH 8.5 g 0  . ALPRAZOLAM XR 1 MG 24 hr tablet Take 1 mg by mouth 3 (three) times daily.     Marland Kitchen amphetamine-dextroamphetamine (ADDERALL XR) 20 MG 24 hr capsule Take 20 mg by mouth daily.     Marland Kitchen amphetamine-dextroamphetamine (ADDERALL) 20 MG tablet Take 20 mg by mouth 2 (two) times daily.    Marland Kitchen Bioflavonoid Products (GRAPE SEED PO) Take by mouth.    Marland Kitchen buPROPion (WELLBUTRIN XL) 150 MG 24 hr tablet Take 150 mg by mouth daily.    Marland Kitchen doxepin (SINEQUAN) 50 MG capsule 1 or 2 tablet by mouth at bedtime    . doxycycline (VIBRAMYCIN) 100 MG capsule Take 100 mg by mouth 2 (two) times daily.    . hydrochlorothiazide (HYDRODIURIL) 50 MG tablet Take 1 tablet (50 mg total) by mouth daily. 90 tablet 1  . levothyroxine (SYNTHROID) 75 MCG tablet TAKE 1 TABLET EVERY DAY ON EMPTY STOMACHWITH A GLASS OF WATER AT LEAST 30-60 MINBEFORE BREAKFAST 90 tablet 2  . Misc Natural Products (GLUCOS-CHONDROIT-MSM COMPLEX PO) Take by mouth.    . orlistat (ALLI) 60 MG capsule Take 60 mg by mouth.    . orphenadrine (NORFLEX) 100 MG tablet Take 1 tablet (100 mg total) by mouth 2 (two) times daily. 20 tablet 0  . predniSONE (DELTASONE) 10 MG tablet Take 3 tabs on days  1-3, take 2 tabs on days 4-6, take 1 tab on days 7-9 18 tablet 0  . tiaGABine (GABITRIL) 4 MG tablet Take 16 mg by mouth at bedtime.    . TURMERIC PO Take by mouth.    Marland Kitchen UNABLE TO FIND Med Name: Carb Blockers    . venlafaxine XR (EFFEXOR-XR) 150 MG  24 hr capsule Take 300 mg by mouth daily with breakfast.     . vitamin B-12 (CYANOCOBALAMIN) 1000 MCG tablet Take by mouth.     No current facility-administered medications on file prior to visit.     BP 120/82   Pulse 98   Temp 98.4 F (36.9 C) (Temporal)   Ht 5' (1.524 m)   Wt 180 lb (81.6 kg)   SpO2 98%   BMI 35.15 kg/m    Objective:   Physical Exam  Constitutional: She is oriented to person, place, and time. She appears well-nourished.  HENT:  Right Ear: Tympanic membrane and ear canal normal.  Left Ear: Tympanic membrane and ear canal normal.  Mouth/Throat: Oropharynx is clear and moist.  Eyes: Pupils are equal, round, and reactive to light. EOM are normal.  Neck: Neck supple.  Cardiovascular: Normal rate and regular rhythm.  Respiratory: Effort normal and breath sounds normal.  GI: Soft. Bowel sounds are normal. There is no abdominal tenderness.  Musculoskeletal:     Comments: Decrease in ROM to lumbar spine, chronic.   Neurological: She is alert and oriented to person, place, and time.  Skin: Skin is warm and dry.  Psychiatric: She has a normal mood and affect.           Assessment & Plan:

## 2018-11-09 NOTE — Addendum Note (Signed)
Addended by: Jacqualin Combes on: 11/09/2018 10:23 AM   Modules accepted: Orders

## 2018-11-09 NOTE — Assessment & Plan Note (Signed)
Scheduled for lumbar decompression with Dr. Rolena Infante on 11/21/2018. Exam today stable, no acute changes. Labs reviewed that were drawn one month ago, no anemia, CKD. A1C unremarkable, other labs stable.  ECG with NSR with rate of 87. No ST changes, acute ischemia, PAC/PVC. Appears similar to ECG from 2005.  Based on labs that patient brought from July 2020, exam, HPI, and ECG today she is cleared for surgery. Form completed and will be faxed.

## 2018-11-09 NOTE — Patient Instructions (Signed)
You are cleared for surgery.  We will fax your form today.  Best of luck! It was a pleasure to see you today!

## 2018-11-12 ENCOUNTER — Ambulatory Visit: Payer: Self-pay | Admitting: Orthopedic Surgery

## 2018-11-12 DIAGNOSIS — G8929 Other chronic pain: Secondary | ICD-10-CM | POA: Diagnosis not present

## 2018-11-12 DIAGNOSIS — M5417 Radiculopathy, lumbosacral region: Secondary | ICD-10-CM | POA: Diagnosis not present

## 2018-11-12 DIAGNOSIS — G894 Chronic pain syndrome: Secondary | ICD-10-CM | POA: Diagnosis not present

## 2018-11-12 DIAGNOSIS — M25551 Pain in right hip: Secondary | ICD-10-CM | POA: Diagnosis not present

## 2018-11-12 NOTE — Telephone Encounter (Signed)
Per Pharamcy:DUE TO THE LIMITED SUPPLY AND RISING COST OF MYLAN BRAND, WE WILL BE DISPENSING ALVOGEN BRAND WITH YOUR PERMISSION

## 2018-11-13 ENCOUNTER — Ambulatory Visit: Payer: Self-pay | Admitting: Orthopedic Surgery

## 2018-11-13 DIAGNOSIS — M48061 Spinal stenosis, lumbar region without neurogenic claudication: Secondary | ICD-10-CM | POA: Diagnosis not present

## 2018-11-13 NOTE — Telephone Encounter (Signed)
Noted.  Approved brand change. We will see her in October scheduled.

## 2018-11-13 NOTE — H&P (Signed)
Subjective:   Ma pleasant 48 year old female with past medical history significant for diabetes (last A1c less than 6), asthma, IBD, thyroid issues, anxiety, complex regional pain syndrome of the upper extremity treated with a stimulator whose chief complaint today is Severe, debilitating low back pain and radicular leg pain into her left buttocks and posterior lateral thigh and her right lower leg. Her right leg is significantly more bothersome than her left leg. She rates her pain today as a 9/10. Despite time, medications, and injection therapy the patient's pain has failed to improve. She would like to move forward with surgical intervention and she is scheduled for Right L3-5 Decompression on 11/21/18 At [facility] with Dr. Rolena Infante.  We have obtained preoperative clearance from her primary care provider, she does not need clearance from her endocrinologist per her PCP. I have also spoken to her pain management provider who has agreed to continue managing her pain medications postoperatively. She was provided a brace at today's visit and she is scheduled for her preoperative exam at [facility] tomorrow. ROS  Patient Active Problem List   Diagnosis Date Noted  . Preoperative clearance 11/09/2018  . Preventative health care 12/21/2017  . Rash and nonspecific skin eruption 12/21/2017  . PCOS (polycystic ovarian syndrome) 11/17/2017  . Complex regional pain syndrome type 1 11/17/2017  . Hypothyroidism 11/17/2017  . ADD (attention deficit disorder) 11/17/2017  . Essential hypertension 11/17/2017  . Depression 02/25/2014  . Chronic pain syndrome 06/06/2012  . Asthma 06/21/2011  . Post-traumatic stress disorder 06/21/2011   Past Medical History:  Diagnosis Date  . Asthma   . Blood in stool   . Chickenpox   . Depression   . Frequent headaches   . Heart murmur   . Hypertension   . Hypothyroidism   . Mononucleosis   . Seasonal allergies   . UTI (urinary tract  infection)     Past Surgical History:  Procedure Laterality Date  . ABDOMINAL HYSTERECTOMY  2009  . ANTERIOR AND POSTERIOR SPINAL FUSION    . AUGMENTATION MAMMAPLASTY Bilateral    lift done at time of augmentation  . BREAST ENHANCEMENT SURGERY  2008  . CARPAL TUNNEL RELEASE Right 2009  . [place], 2006  . LEEP  2000  . SPINAL CORD STIMULATOR INSERTION  2017  . UTERINE FIBROID SURGERY  2005    Current Outpatient Medications  Medication Sig Dispense Refill Last Dose  . albuterol (PROVENTIL HFA;VENTOLIN HFA) 108 (90 Base) MCG/ACT inhaler INHALE 2 PUFFS INTO THE LUNGS EVERY 6 HOURS AS NEEDED FOR WHEEZING OR SHORTNESS OF BREATH 8.5 g 0 Taking  . ALPRAZOLAM XR 1 MG 24 hr tablet Take 1 mg by mouth 3 (three) times daily.    Taking  . amphetamine-dextroamphetamine (ADDERALL XR) 20 MG 24 hr capsule Take 20 mg by mouth daily.    Taking  . amphetamine-dextroamphetamine (ADDERALL) 20 MG tablet Take 20 mg by mouth 2 (two) times daily.   Taking  . Bioflavonoid Products (GRAPE SEED PO) Take by mouth.   Taking  . buPROPion (WELLBUTRIN XL) 300 MG 24 hr tablet Take 300 mg by mouth daily.   Taking  . doxepin (SINEQUAN) 50 MG capsule Take 150 mg by mouth at bedtime. 1 or 2 tablet by mouth at bedtime   Taking  . doxycycline (PERIOSTAT) 20 MG tablet Take 20 mg by mouth 2 (two) times daily.   Taking  . hydrochlorothiazide (HYDRODIURIL) 50 MG tablet Take 1 tablet (50  mg total) by mouth daily. 90 tablet 1 Taking  . levothyroxine (SYNTHROID) 75 MCG tablet TAKE 1 TABLET EVERY DAY ON EMPTY STOMACHWITH A GLASS OF WATER AT LEAST 30-60 MINBEFORE BREAKFAST 90 tablet 2 Taking  . Liraglutide (VICTOZA [place]) Inject 1.8 mLs into the skin.     . metFORMIN (GLUCOPHAGE) 500 MG tablet Take 500 mg by mouth 2 (two) times daily with a meal.   Taking  . Misc Natural Products (GLUCOS-CHONDROIT-MSM COMPLEX PO) Take by mouth.   Taking  . orlistat (ALLI) 60 MG capsule Take 60 mg by mouth.   Taking  . tiaGABine  (GABITRIL) 4 MG tablet Take 16 mg by mouth at bedtime.   Taking  . TURMERIC PO Take by mouth.   Taking  . UNABLE TO FIND Med Name: Carb Blockers   Taking  . venlafaxine XR (EFFEXOR-XR) 150 MG 24 hr capsule Take 300 mg by mouth daily with breakfast.    Taking  . vitamin B-12 (CYANOCOBALAMIN) 1000 MCG tablet Take by mouth.   Taking   No current facility-administered medications for this visit.    Allergies  Allergen Reactions  . Hydrocodone-Acetaminophen Itching, Other (See Comments) and Hives    Other Reaction: Other reaction Other Reaction: Other reaction, hives   . Oxycodone-Acetaminophen Itching, Other (See Comments) and Hives    Other Reaction: Other reaction Other Reaction: Other reaction, hives   . Pertussis Vaccines     Social History   Tobacco Use  . Smoking status: Never Smoker  . Smokeless tobacco: Never Used  Substance Use Topics  . Alcohol use: Yes    Family History  Problem Relation Age of Onset  . Depression Mother   . Learning disabilities Mother   . Depression Father   . Early death Father   . Depression Sister   . Drug abuse Sister   . Early death Sister   . Mental illness Sister   . Miscarriages / Stillbirths Sister   . Multiple sclerosis Sister   . Alcohol abuse Daughter   . Depression Daughter   . Diabetes Daughter   . Learning disabilities Daughter   . Depression Son   . Learning disabilities Son   . Cancer Maternal Grandmother   . Hearing loss Maternal Grandmother   . Breast cancer Maternal Grandmother   . Alcohol abuse Paternal Grandmother   . Heart disease Paternal Grandmother   . Hyperlipidemia Paternal Grandmother   . Hypertension Paternal Grandmother   . Kidney disease Paternal Grandmother   . ADD / ADHD Paternal Grandfather   . COPD Paternal Grandfather   . Kidney disease Paternal Grandfather   . Hypertension Paternal Grandfather   . Hyperlipidemia Paternal Grandfather   . Depression Son     Review of Systems As stated in  HPI  Objective:   Clinical exam: Joel is a pleasant individual, who appears younger than their stated age. She Is alert and orientated 3. No shortness of breath, chest pain. Abdomen is soft and non-tender, negative loss of bowel and bladder control, no rebound tenderness. Negative: skin lesions abrasions contusions  Heart: RRR, no rubs, murmers, or gallops Lungs: CTAB Abdomen: BSx4, Non-tender, non-distended, no hepatosplenomegaly.  Peripheral pulses: 2+ dorsalis pedis/posterior tibialis pulses bilaterally. Compartment soft and nontender.  Gait pattern: Altered gait pattern due to right leg pain.  Assistive devices: currently using a wheel chair for ambulation  Neuro: 5/5 motor strength bilaterally in the lower extremity. Positive femoral stretch test on the right side. Negative straight leg raise test.  1+ deep tendon reflexes at the knee and Achilles. Negative Babinski test, no clonus. Decreased sensation in positive dysesthesias in the L3 dermatome on the right side.  Musculoskeletal: Patient has chronic back pain but over the last 6 months has developed progressive neuropathic right leg pain. She denies any significant hip, knee, ankle pain with passive range of motion. No SI joint pain with direct palpation.   X-rays of the lumbar spine demonstrate normal sagittal alignment. Mild degenerative changes at the L3-4 and L4-5 levels. No significant facet arthropathy, no scoliosis or spondylolisthesis.  MRI of the lumbar spine from May 14, 2018: Foraminal/extraforaminal disc herniation at L3-4 on the right side with compression of the exiting L3 nerve root. Degenerative disc disease also noted at L3-4. No significant central stenosis. Central disc bulge without significant foraminal or central stenosis L4-5 and L5-S1. No acute fracture seen.  Lumbar MRI: completed on 09/25/18 (compared to prior 05/15/18 MRI) was reviewed with the patient. I have also reviewed the radiology report.  Progression in the L4 foraminal stenosis on the right side. Hard disc osteophyte posterior lateral and extraforaminal contributing to the foraminal stenosis. Progressed moderate to severe central stenosis and right lateral recess stenosis at L3-4 contributing to L4 nerve irritation. Degenerative changes at the L4-5 disc mild. Mild degenerative changes at L5 1 and L2-3.  Diagnostic SI joint injection was not significantly helpful and therefore, we can r/o the SI joint as a primary pain generator.  Assessment:   Semaja pleasant 48 year old female with past medical history significant for diabetes (last A1c less than 6), asthma, IBD, thyroid issues, anxiety, complex regional pain syndrome of the upper extremity treated with a stimulator whose chief complaint today is Severe, debilitating low back pain and radicular leg pain into her left buttocks and posterior lateral thigh and her right lower leg. Patient's overall quality-of-life continues to deteriorate because of the severe back buttock and increasing progressive neuropathic right leg pain. Patient did report some increased dysesthesias in the left side but her primary source of pain is the back and right leg. At this point time we discussed surgical intervention for the right lateral recess and foraminal stenosis at L3-4 and L4-5. We have gone over the surgical procedure in great detail. I have also discussed the risks and benefits of surgery and she is expressed understanding. She would like to move forward with surgical intervention and she is scheduled for Right L3-5 Decompression on 11/21/18 At [facility] with Dr. Rolena Infante.  Plan:   L3-5 decompression on 11/21/18 at [facility] with Dr. Rolena Infante  Risks and benefits of surgery were discussed with the patient. These include: Infection, bleeding, death, stroke, paralysis, ongoing or worse pain, need for additional surgery, leak of spinal fluid, adjacent segment degeneration requiring  additional surgery, post-operative hematoma formation that can result in neurological compromise and the need for urgent/emergent re-operation. Loss in bowel and bladder control. Injury to major vessels that could result in the need for urgent abdominal surgery to stop bleeding. Risk of deep venous thrombosis (DVT) and the need for additional treatment. Recurrent disc herniation resulting in the need for revision surgery, which could include fusion surgery (utilizing instrumentation such as pedicle screws and intervertebral cages). Additional risk: If instrumentation is used there is a risk of migration, or breakage of that hardware that could require additional surgery.  We have also discussed the goals of surgery to include: Goals of surgery: Reduction in pain, and improvement in quality of life.  We have  also discussed the post-operative recovery period to include: bathing/showering restrictions, wound healing, activity (and driving) restrictions, medications/pain mangement ( patients pain management provider has agreed to continue managing the patient's pain postoperatively)  We have also discussed post-operative redflags to include: signs and symptoms of postoperative infection, DVT/PE.  We have obtain preoperative clearance from the patient's primary care provider, not needed from the patient's endocrinologist.  Patient was supplied an LSO brace at today's visit.  Patient is scheduled for her preoperative testing and labs tomorrow at [facility], plan will be to proceed with surgery assuming labs are normal.  All patients questions were invited and answered.  Follow-up: 2 weeks postoperatively

## 2018-11-14 ENCOUNTER — Other Ambulatory Visit: Payer: Self-pay

## 2018-11-14 ENCOUNTER — Encounter (HOSPITAL_COMMUNITY)
Admission: RE | Admit: 2018-11-14 | Discharge: 2018-11-14 | Disposition: A | Discharge status: Home / Self Care | Payer: PPO | Source: Ambulatory Visit | Attending: Orthopedic Surgery | Admitting: Orthopedic Surgery

## 2018-11-14 ENCOUNTER — Encounter (HOSPITAL_COMMUNITY): Payer: Self-pay

## 2018-11-14 DIAGNOSIS — M549 Dorsalgia, unspecified: Secondary | ICD-10-CM | POA: Diagnosis not present

## 2018-11-14 DIAGNOSIS — Z01818 Encounter for other preprocedural examination: Secondary | ICD-10-CM | POA: Insufficient documentation

## 2018-11-14 HISTORY — DX: Zoster without complications: B02.9

## 2018-11-14 HISTORY — DX: Radiculopathy, lumbar region: M54.16

## 2018-11-14 HISTORY — DX: Irritable bowel syndrome, unspecified: K58.9

## 2018-11-14 HISTORY — DX: Insulin resistance, unspecified: E88.819

## 2018-11-14 HISTORY — DX: Metabolic syndrome: E88.81

## 2018-11-14 HISTORY — DX: Spinal stenosis, lumbar region without neurogenic claudication: M48.061

## 2018-11-14 HISTORY — DX: Nausea with vomiting, unspecified: R11.2

## 2018-11-14 HISTORY — DX: Unspecified osteoarthritis, unspecified site: M19.90

## 2018-11-14 HISTORY — DX: Presence of spectacles and contact lenses: Z97.3

## 2018-11-14 HISTORY — DX: Anxiety disorder, unspecified: F41.9

## 2018-11-14 HISTORY — DX: Other specified behavioral and emotional disorders with onset usually occurring in childhood and adolescence: F98.8

## 2018-11-14 HISTORY — DX: Nausea with vomiting, unspecified: Z98.890

## 2018-11-14 HISTORY — DX: Anemia, unspecified: D64.9

## 2018-11-14 HISTORY — DX: Complex regional pain syndrome i of right upper limb: G90.511

## 2018-11-14 LAB — URINALYSIS, ROUTINE W REFLEX MICROSCOPIC
Bilirubin Urine: NEGATIVE
Glucose, UA: NEGATIVE mg/dL
Hgb urine dipstick: NEGATIVE
Ketones, ur: NEGATIVE mg/dL
Nitrite: NEGATIVE
Protein, ur: NEGATIVE mg/dL
Specific Gravity, Urine: 1.011 (ref 1.005–1.030)
pH: 6 (ref 5.0–8.0)

## 2018-11-14 LAB — CBC
HCT: 43.3 % (ref 36.0–46.0)
Hemoglobin: 15.2 g/dL — ABNORMAL HIGH (ref 12.0–15.0)
MCH: 32.1 pg (ref 26.0–34.0)
MCHC: 35.1 g/dL (ref 30.0–36.0)
MCV: 91.4 fL (ref 80.0–100.0)
Platelets: 347 10*3/uL (ref 150–400)
RBC: 4.74 MIL/uL (ref 3.87–5.11)
RDW: 11.2 % — ABNORMAL LOW (ref 11.5–15.5)
WBC: 10.6 10*3/uL — ABNORMAL HIGH (ref 4.0–10.5)
nRBC: 0 % (ref 0.0–0.2)

## 2018-11-14 LAB — PROTIME-INR
INR: 1 (ref 0.8–1.2)
Prothrombin Time: 12.7 seconds (ref 11.4–15.2)

## 2018-11-14 LAB — SURGICAL PCR SCREEN
MRSA, PCR: NEGATIVE
Staphylococcus aureus: NEGATIVE

## 2018-11-14 LAB — BASIC METABOLIC PANEL
Anion gap: 12 (ref 5–15)
BUN: 13 mg/dL (ref 6–20)
CO2: 30 mmol/L (ref 22–32)
Calcium: 9.5 mg/dL (ref 8.9–10.3)
Chloride: 97 mmol/L — ABNORMAL LOW (ref 98–111)
Creatinine, Ser: 0.79 mg/dL (ref 0.44–1.00)
GFR calc Af Amer: >60 mL/min (ref >60)
GFR calc non Af Amer: >60 mL/min (ref >60)
Glucose, Bld: 123 mg/dL — ABNORMAL HIGH (ref 70–99)
Potassium: 3.4 mmol/L — ABNORMAL LOW (ref 3.5–5.1)
Sodium: 139 mmol/L (ref 135–145)

## 2018-11-14 LAB — APTT: aPTT: 25 seconds (ref 24–36)

## 2018-11-14 LAB — GLUCOSE, CAPILLARY: Glucose-Capillary: 120 mg/dL — ABNORMAL HIGH (ref 70–99)

## 2018-11-14 NOTE — Progress Notes (Signed)
Pt denies SOB, chest pain, and being under the care of a cardiologist. Pt stated that she is under the care of Dr. Ronnald Collum, Endocrinology and Alma Friendly, NP, PCP. Pt denies having a stress test, echo and cardiac cath. Pt denies having a chest x ray within the last year. Nurse requested LOV note and A1c from Dr. Ronnald Collum; awaiting response. Pt verbalized understanding of all pre-op instructions. Pt chart forwarded to PA, Anesthesiology, for review ( order for consult and clearance note in Epic).

## 2018-11-14 NOTE — Pre-Procedure Instructions (Addendum)
Tanya [name]  11/14/2018     [facility] # [place], [place] [address] [place] [address] Phone: [phone] Fax: [phone]  [id number] [place], [place] [address] Phone: [phone] Fax: [phone]  [place], [place] - [place] [address] Phone: [phone] Fax: [phone]   Your procedure is scheduled on Wednesday, November 21, 2018  Report to [place] at 6:30 A.M.  Call this number if you have problems the morning of surgery:  [phone]   Remember: Brush your teeth the morning of surgery with your regular toothpaste.  Do not eat or drink after midnight Tuesday, November 20, 2018   Take these medicines the morning of surgery with A SIP OF WATER: ALPRAZOLAM,   buPROPion (WELLBUTRIN XL)  levothyroxine (SYNTHROID)  venlafaxine XR (EFFEXOR-XR) If needed: albuterol (PROVENTIL HFA;VENTOLIN HFA) inhaler for wheezing or shortness of breath ( bring inhaler in with you on day of surgery) Stop taking Aspirin (unless otherwise advised by surgeon), White Kidney Bean, Melatonin,  All vitamins, Safflower Oil (TONALIN CLA), fish oil, n-acetyl-l-cysteine , TURMERIC and herbal medications. Do not take any NSAIDs ie: Ibuprofen, Advil, Naproxen (Aleve), Motrin, BC and Goody Powder; stop now.    How to Manage Your Diabetes Before and After Surgery  Why is it important to control my blood sugar before and after surgery? . Improving blood sugar levels before and after surgery helps healing and can limit problems. . A way of improving blood sugar control is eating a healthy diet by: o  Eating less sugar and carbohydrates o  Increasing activity/exercise o  Talking with your doctor about reaching your blood sugar goals . High blood sugars (greater than 180 mg/dL) can raise your risk of infections and  slow your recovery, so you will need to focus on controlling your diabetes during the weeks before surgery. . Make sure that the doctor who takes care of your diabetes knows about your planned surgery including the date and location.  How do I manage my blood sugar before surgery? . Check your blood sugar at least 4 times a day, starting 2 days before surgery, to make sure that the level is not too high or low. o Check your blood sugar the morning of your surgery when you wake up and every 2 hours until you get to the Short Stay unit. . If your blood sugar is less than 70 mg/dL, you will need to treat for low blood sugar: o Treat a low blood sugar (less than 70 mg/dL) with  cup of clear juice (cranberry or apple), 4 glucose tablets, OR glucose gel. Recheck blood sugar in 15 minutes after treatment (to make sure it is greater than 70 mg/dL). If your blood sugar is not greater than 70 mg/dL on recheck, call [phone] o  for further instructions. . Report your blood sugar to the short stay nurse when you get to Short Stay.  . If you are admitted to the hospital after surgery: o Your blood sugar will be checked by the staff and you will probably be given insulin after surgery (instead of oral diabetes medicines) to make sure you have good blood sugar levels. o The goal for blood sugar control after surgery is 80-180 mg/dL  WHAT DO I DO ABOUT MY DIABETES MEDICATION?  Marland Kitchen Do not take oral diabetes medicines (pills)  the morning of surgery such as Metformin ( Glucophage).  . The day of surgery, do not take other diabetes injectables, including Byetta (exenatide), Bydureon (exenatide ER), Victoza (liraglutide), or Trulicity (dulaglutide).  Reviewed and Endorsed by [facility] Patient Education Committee, August 2015  Do not wear jewelry, make-up or nail polish.  Do not wear lotions, powders, or perfumes, or deodorant.  Do not shave 48 hours prior to surgery.    Do not bring valuables to the  hospital.  [facility] is not responsible for any belongings or valuables.  Contacts, dentures or bridgework may not be worn into surgery.  Leave your suitcase in the car.  After surgery it may be brought to your room.  For patients admitted to the hospital, discharge time will be determined by your treatment team.  Patients discharged the day of surgery will not be allowed to drive home.   Special instructions: Shower the night before surgery and morning of surgery with CHG; see " [place] Preparing For Surgery" sheet.  Please read over the following fact sheets that you were given. Pain Booklet, Coughing and Deep Breathing, MRSA Information and Surgical Site Infection Prevention

## 2018-11-15 NOTE — Progress Notes (Signed)
Anesthesia Chart Review:  Case: [id number] Date/Time: 11/21/18 0815   Procedure: Right L3-4, L4-5 decompression (Right ) - 150 mins   Anesthesia type: General   Pre-op diagnosis: Lumbar spinal stenosis   Location: [facility] OR ROOM 04 / [place] [facility]   Surgeon: Melina Schools, MD      DISCUSSION: Patient is a 48 year old female scheduled for the above procedure.  History includes former smoker, post-operative N/V, asthma, murmur (not specified; no murmur documented on 09/27/18 office note with Dr. Ronnald Collum), HTN, hypothyroidism, insulin resistance, anemia, migraines, ADD, complex regional pain syndrome (RUE), spinal cord stimulator (2017), breast augmentation (2008), C5-7 ACDF (09/21/07).  Medical clearance for surgery provided following 11/09/18 evaluation by Alma Friendly, NP. She felt EKG was stable since 2005.  Preoperative labs and EKG appear acceptable for OR. Presurgical COVID-19 test is scheduled for 11/20/18, if negative and otherwise no acute changes then I would anticipate that she can proceed as planned.   VS: BP 111/63   Pulse 87   Temp 36.9 C   Resp 20   Ht 5\' 1"  (1.549 m)   Wt 81.6 kg   SpO2 100%   BMI 34.01 kg/m     PROVIDERS: Pleas Koch, NP is PCP (notes in [facility]). Also followed by Belinda Fisher, MD at [facility] (copy of 09/27/18 office note and recent labs on hard chart; seen for management of obesity, insulin resistance/DM).   LABS: Labs reviewed: Acceptable for surgery. A1c 5.3% on 10/06/18 [facility]). (all labs ordered are listed, but only abnormal results are displayed)  Labs Reviewed  GLUCOSE, CAPILLARY - Abnormal; Notable for the following components:      Result Value   Glucose-Capillary 120 (*)    All other components within normal limits  BASIC METABOLIC PANEL - Abnormal; Notable for the following components:   Potassium 3.4 (*)    Chloride 97 (*)    Glucose, Bld 123 (*)    All other components within normal limits  CBC -  Abnormal; Notable for the following components:   WBC 10.6 (*)    Hemoglobin 15.2 (*)    RDW 11.2 (*)    All other components within normal limits  URINALYSIS, ROUTINE W REFLEX MICROSCOPIC - Abnormal; Notable for the following components:   Leukocytes,Ua TRACE (*)    Bacteria, UA RARE (*)    All other components within normal limits  SURGICAL PCR SCREEN  APTT  PROTIME-INR     IMAGES: CXR 11/14/18: FINDINGS: Cardiac shadows within normal limits. Spinal stimulator is noted as well as postsurgical changes in the cervical spine. The lungs are clear. No acute abnormality is noted. IMPRESSION: No active cardiopulmonary disease.   EKG: 11/09/18: Sinus  Rhythm  Low voltage in precordial leads.  ABNORMAL    CV: N/A  Past Medical History:  Diagnosis Date  . ADD (attention deficit disorder)   . Anemia   . Anxiety   . Arthritis   . Asthma   . Blood in stool   . Chickenpox   . Complex regional pain syndrome of right upper extremity   . Depression   . Frequent headaches    migraines  . Heart murmur   . Hypertension   . Hypothyroidism   . IBS (irritable bowel syndrome)   . Insulin resistance   . Lumbar radiculopathy   . Mononucleosis   . PONV (postoperative nausea and vomiting)   . Seasonal allergies   . Shingles   . Spinal stenosis, lumbar   .  UTI (urinary tract infection)   . Wears glasses     Past Surgical History:  Procedure Laterality Date  . ABDOMINAL HYSTERECTOMY  2009  . ANTERIOR AND POSTERIOR SPINAL FUSION    . AUGMENTATION MAMMAPLASTY Bilateral    lift done at time of augmentation  . BREAST ENHANCEMENT SURGERY  2008  . CARPAL TUNNEL RELEASE Right 2009  . [place], 2006  . cold cone colonization    . LEEP  2000  . SPINAL CORD STIMULATOR INSERTION  2017  . TUBAL LIGATION    . UTERINE FIBROID SURGERY  2005    MEDICATIONS: . albuterol (PROVENTIL HFA;VENTOLIN HFA) 108 (90 Base) MCG/ACT inhaler  . ALPRAZOLAM XR 1 MG 24 hr tablet  .  amphetamine-dextroamphetamine (ADDERALL XR) 20 MG 24 hr capsule  . amphetamine-dextroamphetamine (ADDERALL) 20 MG tablet  . buPROPion (WELLBUTRIN XL) 150 MG 24 hr tablet  . buPROPion (WELLBUTRIN XL) 300 MG 24 hr tablet  . cetirizine (ZYRTEC) 10 MG tablet  . clindamycin-benzoyl peroxide (BENZACLIN) gel  . doxepin (SINEQUAN) 50 MG capsule  . doxycycline (PERIOSTAT) 20 MG tablet  . hydrochlorothiazide (HYDRODIURIL) 50 MG tablet  . levothyroxine (SYNTHROID) 75 MCG tablet  . Liraglutide (VICTOZA [place])  . Melatonin 5 MG CAPS  . metFORMIN (GLUCOPHAGE) 500 MG tablet  . Multiple Vitamin (MULTIVITAMIN WITH MINERALS) TABS tablet  . orlistat (ALLI) 60 MG capsule  . OVER THE COUNTER MEDICATION  . Safflower Oil (TONALIN CLA) 1000 MG CAPS  . temazepam (RESTORIL) 15 MG capsule  . tiaGABine (GABITRIL) 4 MG tablet  . tretinoin (RETIN-A) 0.025 % cream  . TURMERIC PO  . venlafaxine XR (EFFEXOR-XR) 150 MG 24 hr capsule  . vitamin B-12 (CYANOCOBALAMIN) 1000 MCG tablet  . White Kidney Bean 500 MG CAPS   No current facility-administered medications for this encounter.     Myra Gianotti, PA-C Surgical Short Stay/Anesthesiology [facility] Phone [phone] [facility] Phone [phone] 11/15/2018 12:16 PM

## 2018-11-15 NOTE — Anesthesia Preprocedure Evaluation (Addendum)
Anesthesia Evaluation  Patient identified by MRN, date of birth, ID band Patient awake    Reviewed: Allergy & Precautions, NPO status , Patient's Chart, lab work & pertinent test results  History of Anesthesia Complications (+) PONV and history of anesthetic complications  Airway Mallampati: II  TM Distance: >3 FB Neck ROM: Full    Dental  (+) Dental Advisory Given, Teeth Intact   Pulmonary asthma , former smoker,    Pulmonary exam normal        Cardiovascular hypertension, Pt. on medications Normal cardiovascular exam     Neuro/Psych  Headaches, PSYCHIATRIC DISORDERS Anxiety Depression  Lumbar spinal cord stimulator     GI/Hepatic Neg liver ROS,  IBS    Endo/Other  diabetes, Type 2, Oral Hypoglycemic AgentsHypothyroidism  Obesity   Renal/GU negative Renal ROS     Musculoskeletal  (+) Arthritis ,   Abdominal   Peds  (+) ATTENTION DEFICIT DISORDER WITHOUT HYPERACTIVITY Hematology negative hematology ROS (+)   Anesthesia Other Findings CRPS of RUE  Reproductive/Obstetrics  S/p hysterectomy                            Anesthesia Physical Anesthesia Plan  ASA: III  Anesthesia Plan: General   Post-op Pain Management:    Induction: Intravenous  PONV Risk Score and Plan: 4 or greater and Treatment may vary due to age or medical condition, Ondansetron, Scopolamine patch - Pre-op, Midazolam, Dexamethasone and Propofol infusion  Airway Management Planned: Oral ETT  Additional Equipment: None  Intra-op Plan:   Post-operative Plan: Extubation in OR  Informed Consent: I have reviewed the patients History and Physical, chart, labs and discussed the procedure including the risks, benefits and alternatives for the proposed anesthesia with the patient or authorized representative who has indicated his/her understanding and acceptance.     Dental advisory given  Plan Discussed  with: CRNA and Anesthesiologist  Anesthesia Plan Comments:       Anesthesia Quick Evaluation

## 2018-11-16 DIAGNOSIS — F900 Attention-deficit hyperactivity disorder, predominantly inattentive type: Secondary | ICD-10-CM | POA: Diagnosis not present

## 2018-11-16 DIAGNOSIS — F4312 Post-traumatic stress disorder, chronic: Secondary | ICD-10-CM | POA: Diagnosis not present

## 2018-11-16 DIAGNOSIS — F3341 Major depressive disorder, recurrent, in partial remission: Secondary | ICD-10-CM | POA: Diagnosis not present

## 2018-11-20 ENCOUNTER — Other Ambulatory Visit (HOSPITAL_COMMUNITY)
Admission: RE | Admit: 2018-11-20 | Discharge: 2018-11-20 | Disposition: A | Discharge status: Home / Self Care | Payer: PPO | Source: Ambulatory Visit | Attending: Orthopedic Surgery | Admitting: Orthopedic Surgery

## 2018-11-20 DIAGNOSIS — F411 Generalized anxiety disorder: Secondary | ICD-10-CM | POA: Diagnosis not present

## 2018-11-20 DIAGNOSIS — F4312 Post-traumatic stress disorder, chronic: Secondary | ICD-10-CM | POA: Diagnosis not present

## 2018-11-20 DIAGNOSIS — Z01812 Encounter for preprocedural laboratory examination: Secondary | ICD-10-CM | POA: Diagnosis not present

## 2018-11-20 DIAGNOSIS — Z20828 Contact with and (suspected) exposure to other viral communicable diseases: Secondary | ICD-10-CM | POA: Diagnosis not present

## 2018-11-20 DIAGNOSIS — F3341 Major depressive disorder, recurrent, in partial remission: Secondary | ICD-10-CM | POA: Diagnosis not present

## 2018-11-20 MED ORDER — CEFAZOLIN SODIUM-DEXTROSE 2-4 GM/100ML-% IV SOLN
2.0000 g | INTRAVENOUS | Status: AC
  Administered 2018-11-21: 2 g via INTRAVENOUS
  Filled 2018-11-20 (×2): qty 100

## 2018-11-20 NOTE — Telephone Encounter (Signed)
[facility] and they will note on patient's account of the brand change

## 2018-11-21 ENCOUNTER — Ambulatory Visit (HOSPITAL_COMMUNITY): Payer: PPO

## 2018-11-21 ENCOUNTER — Ambulatory Visit (HOSPITAL_COMMUNITY)
Admission: RE | Disposition: A | Discharge status: Home / Self Care | Payer: Self-pay | Source: Home / Self Care | Attending: Orthopedic Surgery

## 2018-11-21 ENCOUNTER — Observation Stay (HOSPITAL_COMMUNITY)
Admission: RE | Admit: 2018-11-21 | Discharge: 2018-11-22 | Disposition: A | Discharge status: Home / Self Care | Payer: PPO | Attending: Orthopedic Surgery | Admitting: Orthopedic Surgery

## 2018-11-21 ENCOUNTER — Ambulatory Visit (HOSPITAL_COMMUNITY): Payer: PPO | Admitting: Anesthesiology

## 2018-11-21 ENCOUNTER — Other Ambulatory Visit: Payer: Self-pay

## 2018-11-21 ENCOUNTER — Encounter (HOSPITAL_COMMUNITY): Payer: Self-pay | Admitting: General Practice

## 2018-11-21 ENCOUNTER — Ambulatory Visit (HOSPITAL_COMMUNITY): Payer: PPO | Admitting: Vascular Surgery

## 2018-11-21 DIAGNOSIS — E119 Type 2 diabetes mellitus without complications: Secondary | ICD-10-CM | POA: Insufficient documentation

## 2018-11-21 DIAGNOSIS — J45909 Unspecified asthma, uncomplicated: Secondary | ICD-10-CM | POA: Diagnosis not present

## 2018-11-21 DIAGNOSIS — F418 Other specified anxiety disorders: Secondary | ICD-10-CM | POA: Diagnosis not present

## 2018-11-21 DIAGNOSIS — Z79899 Other long term (current) drug therapy: Secondary | ICD-10-CM | POA: Insufficient documentation

## 2018-11-21 DIAGNOSIS — F4310 Post-traumatic stress disorder, unspecified: Secondary | ICD-10-CM | POA: Diagnosis not present

## 2018-11-21 DIAGNOSIS — M48061 Spinal stenosis, lumbar region without neurogenic claudication: Principal | ICD-10-CM | POA: Insufficient documentation

## 2018-11-21 DIAGNOSIS — G90519 Complex regional pain syndrome I of unspecified upper limb: Secondary | ICD-10-CM | POA: Insufficient documentation

## 2018-11-21 DIAGNOSIS — I10 Essential (primary) hypertension: Secondary | ICD-10-CM | POA: Insufficient documentation

## 2018-11-21 DIAGNOSIS — E039 Hypothyroidism, unspecified: Secondary | ICD-10-CM | POA: Insufficient documentation

## 2018-11-21 DIAGNOSIS — M4800 Spinal stenosis, site unspecified: Secondary | ICD-10-CM | POA: Diagnosis present

## 2018-11-21 DIAGNOSIS — F988 Other specified behavioral and emotional disorders with onset usually occurring in childhood and adolescence: Secondary | ICD-10-CM | POA: Insufficient documentation

## 2018-11-21 DIAGNOSIS — M5416 Radiculopathy, lumbar region: Secondary | ICD-10-CM | POA: Insufficient documentation

## 2018-11-21 DIAGNOSIS — F329 Major depressive disorder, single episode, unspecified: Secondary | ICD-10-CM | POA: Insufficient documentation

## 2018-11-21 DIAGNOSIS — E669 Obesity, unspecified: Secondary | ICD-10-CM | POA: Insufficient documentation

## 2018-11-21 DIAGNOSIS — Z7984 Long term (current) use of oral hypoglycemic drugs: Secondary | ICD-10-CM | POA: Diagnosis not present

## 2018-11-21 DIAGNOSIS — Z87891 Personal history of nicotine dependence: Secondary | ICD-10-CM | POA: Insufficient documentation

## 2018-11-21 DIAGNOSIS — Z6834 Body mass index (BMI) 34.0-34.9, adult: Secondary | ICD-10-CM | POA: Insufficient documentation

## 2018-11-21 DIAGNOSIS — M5136 Other intervertebral disc degeneration, lumbar region: Secondary | ICD-10-CM | POA: Diagnosis not present

## 2018-11-21 DIAGNOSIS — Z79890 Hormone replacement therapy (postmenopausal): Secondary | ICD-10-CM | POA: Insufficient documentation

## 2018-11-21 DIAGNOSIS — Z9682 Presence of neurostimulator: Secondary | ICD-10-CM | POA: Insufficient documentation

## 2018-11-21 DIAGNOSIS — M4326 Fusion of spine, lumbar region: Secondary | ICD-10-CM | POA: Diagnosis not present

## 2018-11-21 DIAGNOSIS — F419 Anxiety disorder, unspecified: Secondary | ICD-10-CM | POA: Insufficient documentation

## 2018-11-21 DIAGNOSIS — Z419 Encounter for procedure for purposes other than remedying health state, unspecified: Secondary | ICD-10-CM

## 2018-11-21 HISTORY — PX: LUMBAR LAMINECTOMY/DECOMPRESSION MICRODISCECTOMY: SHX5026

## 2018-11-21 HISTORY — DX: Spinal stenosis, site unspecified: M48.00

## 2018-11-21 HISTORY — PX: ANTERIOR AND POSTERIOR SPINAL FUSION: SHX2259

## 2018-11-21 LAB — GLUCOSE, CAPILLARY
Glucose-Capillary: 128 mg/dL — ABNORMAL HIGH (ref 70–99)
Glucose-Capillary: 163 mg/dL — ABNORMAL HIGH (ref 70–99)
Glucose-Capillary: 161 mg/dL — ABNORMAL HIGH (ref 70–99)
Glucose-Capillary: 157 mg/dL — ABNORMAL HIGH (ref 70–99)

## 2018-11-21 LAB — SARS CORONAVIRUS 2 (TAT 6-24 HRS): SARS Coronavirus 2: NEGATIVE

## 2018-11-21 SURGERY — LUMBAR LAMINECTOMY/DECOMPRESSION MICRODISCECTOMY 2 LEVELS
Anesthesia: General | Site: Spine Lumbar | Laterality: Right

## 2018-11-21 MED ORDER — ALPRAZOLAM 0.5 MG PO TABS
1.0000 mg | ORAL_TABLET | Freq: Three times a day (TID) | ORAL | Status: DC
  Administered 2018-11-21 (×2): 1 mg via ORAL
  Filled 2018-11-21 (×2): qty 2

## 2018-11-21 MED ORDER — MAGNESIUM CITRATE PO SOLN
1.0000 | Freq: Once | ORAL | Status: DC | PRN
  Filled 2018-11-21 (×2): qty 296

## 2018-11-21 MED ORDER — ROCURONIUM BROMIDE 10 MG/ML (PF) SYRINGE
PREFILLED_SYRINGE | INTRAVENOUS | Status: AC
  Filled 2018-11-21: qty 10

## 2018-11-21 MED ORDER — PROPOFOL 10 MG/ML IV BOLUS
INTRAVENOUS | Status: DC | PRN
  Administered 2018-11-21: 150 mg via INTRAVENOUS

## 2018-11-21 MED ORDER — BUPIVACAINE-EPINEPHRINE (PF) 0.25% -1:200000 IJ SOLN
INTRAMUSCULAR | Status: AC
  Filled 2018-11-21: qty 30

## 2018-11-21 MED ORDER — PROPOFOL 10 MG/ML IV BOLUS
INTRAVENOUS | Status: AC
  Filled 2018-11-21: qty 40

## 2018-11-21 MED ORDER — DEXAMETHASONE SODIUM PHOSPHATE 10 MG/ML IJ SOLN
INTRAMUSCULAR | Status: DC | PRN
  Administered 2018-11-21: 10 mg via INTRAVENOUS

## 2018-11-21 MED ORDER — GLYCOPYRROLATE 0.2 MG/ML IJ SOLN
INTRAMUSCULAR | Status: DC | PRN
  Administered 2018-11-21 (×2): 0.1 mg via INTRAVENOUS

## 2018-11-21 MED ORDER — HYDROCHLOROTHIAZIDE 25 MG PO TABS: 50.0000 mg | ORAL_TABLET | Freq: Every day | ORAL | Status: DC

## 2018-11-21 MED ORDER — DOXEPIN HCL 50 MG PO CAPS
150.0000 mg | ORAL_CAPSULE | Freq: Every day | ORAL | Status: DC
  Filled 2018-11-21: qty 2
  Filled 2018-11-21: qty 3

## 2018-11-21 MED ORDER — SODIUM CHLORIDE 0.9% FLUSH: 3.0000 mL | INTRAVENOUS | Status: DC | PRN

## 2018-11-21 MED ORDER — GABAPENTIN 300 MG PO CAPS
300.0000 mg | ORAL_CAPSULE | Freq: Three times a day (TID) | ORAL | Status: DC
  Administered 2018-11-21 (×2): 300 mg via ORAL
  Filled 2018-11-21 (×2): qty 1

## 2018-11-21 MED ORDER — LIDOCAINE 2% (20 MG/ML) 5 ML SYRINGE
INTRAMUSCULAR | Status: DC | PRN
  Administered 2018-11-21: 60 mg via INTRAVENOUS

## 2018-11-21 MED ORDER — TEMAZEPAM 15 MG PO CAPS: 30.0000 mg | ORAL_CAPSULE | Freq: Every evening | ORAL | Status: DC | PRN

## 2018-11-21 MED ORDER — LACTATED RINGERS IV SOLN
INTRAVENOUS | Status: DC | PRN
  Administered 2018-11-21: 08:00:00 via INTRAVENOUS

## 2018-11-21 MED ORDER — ONDANSETRON HCL 4 MG/2ML IJ SOLN: 4.0000 mg | Freq: Four times a day (QID) | INTRAMUSCULAR | Status: DC | PRN

## 2018-11-21 MED ORDER — GLYCOPYRROLATE PF 0.2 MG/ML IJ SOSY
PREFILLED_SYRINGE | INTRAMUSCULAR | Status: AC
  Filled 2018-11-21: qty 1

## 2018-11-21 MED ORDER — CEFAZOLIN SODIUM-DEXTROSE 1-4 GM/50ML-% IV SOLN
1.0000 g | Freq: Three times a day (TID) | INTRAVENOUS | Status: AC
  Administered 2018-11-21 – 2018-11-22 (×2): 1 g via INTRAVENOUS
  Filled 2018-11-21 (×2): qty 50

## 2018-11-21 MED ORDER — DIPHENHYDRAMINE HCL 50 MG/ML IJ SOLN
INTRAMUSCULAR | Status: DC | PRN
  Administered 2018-11-21: 12.5 mg via INTRAVENOUS

## 2018-11-21 MED ORDER — ROCURONIUM BROMIDE 50 MG/5ML IV SOSY
PREFILLED_SYRINGE | INTRAVENOUS | Status: DC | PRN
  Administered 2018-11-21: 100 mg via INTRAVENOUS

## 2018-11-21 MED ORDER — METHYLPREDNISOLONE ACETATE 40 MG/ML IJ SUSP
INTRAMUSCULAR | Status: DC | PRN
  Administered 2018-11-21: 40 mg

## 2018-11-21 MED ORDER — HEMOSTATIC AGENTS (NO CHARGE) OPTIME
TOPICAL | Status: DC | PRN
  Administered 2018-11-21: 1

## 2018-11-21 MED ORDER — MIDAZOLAM HCL 2 MG/2ML IJ SOLN
INTRAMUSCULAR | Status: DC | PRN
  Administered 2018-11-21: 2 mg via INTRAVENOUS

## 2018-11-21 MED ORDER — PROPOFOL 500 MG/50ML IV EMUL
INTRAVENOUS | Status: AC
  Filled 2018-11-21: qty 50

## 2018-11-21 MED ORDER — KETAMINE HCL 50 MG/5ML IJ SOSY
PREFILLED_SYRINGE | INTRAMUSCULAR | Status: AC
  Filled 2018-11-21: qty 5

## 2018-11-21 MED ORDER — METHYLPREDNISOLONE ACETATE 40 MG/ML IJ SUSP
INTRAMUSCULAR | Status: AC
  Filled 2018-11-21: qty 1

## 2018-11-21 MED ORDER — FENTANYL CITRATE (PF) 250 MCG/5ML IJ SOLN
INTRAMUSCULAR | Status: DC | PRN
  Administered 2018-11-21 (×4): 50 ug via INTRAVENOUS

## 2018-11-21 MED ORDER — VENLAFAXINE HCL ER 75 MG PO CP24: 300.0000 mg | ORAL_CAPSULE | Freq: Every day | ORAL | Status: DC

## 2018-11-21 MED ORDER — FENTANYL CITRATE (PF) 250 MCG/5ML IJ SOLN
INTRAMUSCULAR | Status: AC
  Filled 2018-11-21: qty 5

## 2018-11-21 MED ORDER — ONDANSETRON HCL 4 MG/2ML IJ SOLN
INTRAMUSCULAR | Status: AC
  Filled 2018-11-21: qty 2

## 2018-11-21 MED ORDER — ALBUMIN HUMAN 5 % IV SOLN
INTRAVENOUS | Status: DC | PRN
  Administered 2018-11-21: 12:00:00 via INTRAVENOUS

## 2018-11-21 MED ORDER — ONDANSETRON HCL 4 MG/2ML IJ SOLN
INTRAMUSCULAR | Status: DC | PRN
  Administered 2018-11-21: 4 mg via INTRAVENOUS

## 2018-11-21 MED ORDER — ARTIFICIAL TEARS OPHTHALMIC OINT
TOPICAL_OINTMENT | OPHTHALMIC | Status: AC
  Filled 2018-11-21: qty 3.5

## 2018-11-21 MED ORDER — DIPHENHYDRAMINE HCL 50 MG/ML IJ SOLN
INTRAMUSCULAR | Status: AC
  Filled 2018-11-21: qty 1

## 2018-11-21 MED ORDER — BUPIVACAINE-EPINEPHRINE (PF) 0.25% -1:200000 IJ SOLN
INTRAMUSCULAR | Status: DC | PRN
  Administered 2018-11-21: 20 mL

## 2018-11-21 MED ORDER — BUPROPION HCL ER (XL) 300 MG PO TB24: 300.0000 mg | ORAL_TABLET | Freq: Every day | ORAL | Status: DC

## 2018-11-21 MED ORDER — HYDROMORPHONE HCL 1 MG/ML IJ SOLN
1.0000 mg | INTRAMUSCULAR | Status: DC | PRN
  Administered 2018-11-21: 1 mg via INTRAVENOUS
  Filled 2018-11-21: qty 1

## 2018-11-21 MED ORDER — INSULIN ASPART 100 UNIT/ML ~~LOC~~ SOLN: 0.0000 [IU] | Freq: Every day | SUBCUTANEOUS | Status: DC

## 2018-11-21 MED ORDER — PROMETHAZINE HCL 25 MG/ML IJ SOLN: 6.2500 mg | INTRAMUSCULAR | Status: DC | PRN

## 2018-11-21 MED ORDER — 0.9 % SODIUM CHLORIDE (POUR BTL) OPTIME
TOPICAL | Status: DC | PRN
  Administered 2018-11-21: 1000 mL

## 2018-11-21 MED ORDER — SODIUM CHLORIDE 0.9 % IV SOLN: 250.0000 mL | INTRAVENOUS | Status: DC

## 2018-11-21 MED ORDER — TRANEXAMIC ACID-NACL 1000-0.7 MG/100ML-% IV SOLN
INTRAVENOUS | Status: AC
  Filled 2018-11-21: qty 100

## 2018-11-21 MED ORDER — OXYCODONE HCL 5 MG PO TABS
10.0000 mg | ORAL_TABLET | ORAL | Status: DC | PRN
  Administered 2018-11-21 – 2018-11-22 (×5): 10 mg via ORAL
  Filled 2018-11-21 (×5): qty 2

## 2018-11-21 MED ORDER — METFORMIN HCL 500 MG PO TABS
500.0000 mg | ORAL_TABLET | Freq: Two times a day (BID) | ORAL | Status: DC
  Administered 2018-11-22: 500 mg via ORAL
  Filled 2018-11-21: qty 1

## 2018-11-21 MED ORDER — PROPOFOL 500 MG/50ML IV EMUL
INTRAVENOUS | Status: DC | PRN
  Administered 2018-11-21: 20 ug/kg/min via INTRAVENOUS

## 2018-11-21 MED ORDER — MENTHOL 3 MG MT LOZG: 1.0000 | LOZENGE | OROMUCOSAL | Status: DC | PRN

## 2018-11-21 MED ORDER — MIDAZOLAM HCL 2 MG/2ML IJ SOLN
INTRAMUSCULAR | Status: AC
  Filled 2018-11-21: qty 2

## 2018-11-21 MED ORDER — METHOCARBAMOL 1000 MG/10ML IJ SOLN
500.0000 mg | Freq: Four times a day (QID) | INTRAVENOUS | Status: DC | PRN
  Filled 2018-11-21 (×2): qty 5

## 2018-11-21 MED ORDER — SCOPOLAMINE 1 MG/3DAYS TD PT72
MEDICATED_PATCH | TRANSDERMAL | Status: DC | PRN
  Administered 2018-11-21: 1 via TRANSDERMAL

## 2018-11-21 MED ORDER — METHOCARBAMOL 500 MG PO TABS
500.0000 mg | ORAL_TABLET | Freq: Four times a day (QID) | ORAL | Status: DC | PRN
  Administered 2018-11-21 – 2018-11-22 (×3): 500 mg via ORAL
  Filled 2018-11-21 (×3): qty 1

## 2018-11-21 MED ORDER — KETAMINE HCL 10 MG/ML IJ SOLN
INTRAMUSCULAR | Status: DC | PRN
  Administered 2018-11-21: 50 mg via INTRAVENOUS

## 2018-11-21 MED ORDER — OXYCODONE HCL 5 MG PO TABS: 5.0000 mg | ORAL_TABLET | ORAL | Status: DC | PRN

## 2018-11-21 MED ORDER — OXYCODONE-ACETAMINOPHEN 10-325 MG PO TABS: 1.0000 | ORAL_TABLET | Freq: Four times a day (QID) | ORAL | 0 refills | Status: DC | PRN

## 2018-11-21 MED ORDER — SODIUM CHLORIDE 0.9% FLUSH
3.0000 mL | Freq: Two times a day (BID) | INTRAVENOUS | Status: DC
  Administered 2018-11-21 (×2): 3 mL via INTRAVENOUS

## 2018-11-21 MED ORDER — AMPHETAMINE-DEXTROAMPHETAMINE 10 MG PO TABS: 20.0000 mg | ORAL_TABLET | ORAL | Status: DC

## 2018-11-21 MED ORDER — METHOCARBAMOL 500 MG PO TABS: 500.0000 mg | ORAL_TABLET | Freq: Three times a day (TID) | ORAL | 0 refills | Status: DC | PRN

## 2018-11-21 MED ORDER — SCOPOLAMINE 1 MG/3DAYS TD PT72
MEDICATED_PATCH | TRANSDERMAL | Status: AC
  Filled 2018-11-21: qty 1

## 2018-11-21 MED ORDER — BUPROPION HCL ER (XL) 150 MG PO TB24
150.0000 mg | ORAL_TABLET | ORAL | Status: DC
  Filled 2018-11-21 (×2): qty 1

## 2018-11-21 MED ORDER — LEVOTHYROXINE SODIUM 75 MCG PO TABS
75.0000 ug | ORAL_TABLET | Freq: Every day | ORAL | Status: DC
  Administered 2018-11-22: 75 ug via ORAL
  Filled 2018-11-21: qty 1

## 2018-11-21 MED ORDER — SUGAMMADEX SODIUM 200 MG/2ML IV SOLN
INTRAVENOUS | Status: DC | PRN
  Administered 2018-11-21: 163.2 mg via INTRAVENOUS

## 2018-11-21 MED ORDER — LACTATED RINGERS IV SOLN: INTRAVENOUS | Status: DC

## 2018-11-21 MED ORDER — DEXAMETHASONE SODIUM PHOSPHATE 10 MG/ML IJ SOLN
INTRAMUSCULAR | Status: AC
  Filled 2018-11-21: qty 1

## 2018-11-21 MED ORDER — LIDOCAINE 2% (20 MG/ML) 5 ML SYRINGE
INTRAMUSCULAR | Status: AC
  Filled 2018-11-21: qty 5

## 2018-11-21 MED ORDER — PHENOL 1.4 % MT LIQD: 1.0000 | OROMUCOSAL | Status: DC | PRN

## 2018-11-21 MED ORDER — FENTANYL CITRATE (PF) 100 MCG/2ML IJ SOLN
25.0000 ug | INTRAMUSCULAR | Status: DC | PRN
  Administered 2018-11-21 (×2): 50 ug via INTRAVENOUS

## 2018-11-21 MED ORDER — ONDANSETRON HCL 4 MG PO TABS: 4.0000 mg | ORAL_TABLET | Freq: Four times a day (QID) | ORAL | Status: DC | PRN

## 2018-11-21 MED ORDER — AMPHETAMINE-DEXTROAMPHET ER 10 MG PO CP24: 20.0000 mg | ORAL_CAPSULE | Freq: Every day | ORAL | Status: DC

## 2018-11-21 MED ORDER — ALBUTEROL SULFATE (2.5 MG/3ML) 0.083% IN NEBU: 3.0000 mL | INHALATION_SOLUTION | Freq: Four times a day (QID) | RESPIRATORY_TRACT | Status: DC | PRN

## 2018-11-21 MED ORDER — POLYETHYLENE GLYCOL 3350 17 G PO PACK: 17.0000 g | PACK | Freq: Every day | ORAL | Status: DC | PRN

## 2018-11-21 MED ORDER — INSULIN ASPART 100 UNIT/ML ~~LOC~~ SOLN
0.0000 [IU] | Freq: Three times a day (TID) | SUBCUTANEOUS | Status: DC
  Administered 2018-11-21 – 2018-11-22 (×2): 3 [IU] via SUBCUTANEOUS

## 2018-11-21 MED ORDER — ONDANSETRON HCL 4 MG PO TABS: 4.0000 mg | ORAL_TABLET | Freq: Three times a day (TID) | ORAL | 0 refills | Status: DC | PRN

## 2018-11-21 MED ORDER — TRANEXAMIC ACID-NACL 1000-0.7 MG/100ML-% IV SOLN
INTRAVENOUS | Status: DC | PRN
  Administered 2018-11-21: 1000 mg via INTRAVENOUS

## 2018-11-21 MED ORDER — THROMBIN (RECOMBINANT) 20000 UNITS EX SOLR
CUTANEOUS | Status: AC
  Filled 2018-11-21: qty 20000

## 2018-11-21 MED ORDER — THROMBIN 20000 UNITS EX SOLR
CUTANEOUS | Status: DC | PRN
  Administered 2018-11-21: 20 mL

## 2018-11-21 MED ORDER — TIAGABINE HCL 4 MG PO TABS
16.0000 mg | ORAL_TABLET | Freq: Every day | ORAL | Status: DC
  Administered 2018-11-21: 16 mg via ORAL
  Filled 2018-11-21 (×2): qty 4

## 2018-11-21 MED ORDER — FENTANYL CITRATE (PF) 100 MCG/2ML IJ SOLN
INTRAMUSCULAR | Status: AC
  Administered 2018-11-21: 50 ug via INTRAVENOUS
  Filled 2018-11-21: qty 2

## 2018-11-21 SURGICAL SUPPLY — 55 items
CLSR STERI-STRIP ANTIMIC 1/2X4 (GAUZE/BANDAGES/DRESSINGS) ×2 IMPLANT
CONT SPEC 4OZ CLIKSEAL STRL BL (MISCELLANEOUS) ×2 IMPLANT
CORD BIPOLAR FORCEPS 12FT (ELECTRODE) ×2 IMPLANT
COVER WAND RF STERILE (DRAPES) ×2 IMPLANT
DRAIN CHANNEL 15F RND FF W/TCR (WOUND CARE) IMPLANT
DRAPE POUCH INSTRU U-SHP 10X18 (DRAPES) ×2 IMPLANT
DRAPE SURG 17X11 SM STRL (DRAPES) ×2 IMPLANT
DRAPE U-SHAPE 47X51 STRL (DRAPES) ×2 IMPLANT
DRSG OPSITE POSTOP 4X6 (GAUZE/BANDAGES/DRESSINGS) ×2 IMPLANT
DURAPREP 26ML APPLICATOR (WOUND CARE) ×2 IMPLANT
ELECT BLADE 4.0 EZ CLEAN MEGAD (MISCELLANEOUS) ×2
ELECT CAUTERY BLADE 6.4 (BLADE) ×2 IMPLANT
ELECT PENCIL ROCKER SW 15FT (MISCELLANEOUS) ×2 IMPLANT
ELECT REM PT RETURN 9FT ADLT (ELECTROSURGICAL) ×2
ELECTRODE BLDE 4.0 EZ CLN MEGD (MISCELLANEOUS) ×1 IMPLANT
ELECTRODE REM PT RTRN 9FT ADLT (ELECTROSURGICAL) ×1 IMPLANT
EVACUATOR SILICONE 100CC (DRAIN) IMPLANT
GLOVE BIO SURGEON STRL SZ 6.5 (GLOVE) ×2 IMPLANT
GLOVE BIOGEL PI IND STRL 6.5 (GLOVE) ×1 IMPLANT
GLOVE BIOGEL PI IND STRL 8.5 (GLOVE) ×1 IMPLANT
GLOVE BIOGEL PI INDICATOR 6.5 (GLOVE) ×1
GLOVE BIOGEL PI INDICATOR 8.5 (GLOVE) ×1
GLOVE SS BIOGEL STRL SZ 8.5 (GLOVE) ×2 IMPLANT
GLOVE SUPERSENSE BIOGEL SZ 8.5 (GLOVE) ×2
GOWN STRL REUS W/ TWL LRG LVL3 (GOWN DISPOSABLE) ×2 IMPLANT
GOWN STRL REUS W/TWL 2XL LVL3 (GOWN DISPOSABLE) ×2 IMPLANT
GOWN STRL REUS W/TWL LRG LVL3 (GOWN DISPOSABLE) ×2
IV CATH AUTO 14GX1.75 SAFE ORG (IV SOLUTION) ×2 IMPLANT
KIT BASIN OR (CUSTOM PROCEDURE TRAY) ×2 IMPLANT
NEEDLE 22X1 1/2 (OR ONLY) (NEEDLE) ×4 IMPLANT
NEEDLE SPNL 18GX3.5 QUINCKE PK (NEEDLE) ×4 IMPLANT
NS IRRIG 1000ML POUR BTL (IV SOLUTION) ×2 IMPLANT
PACK LAMINECTOMY ORTHO (CUSTOM PROCEDURE TRAY) ×2 IMPLANT
PACK UNIVERSAL I (CUSTOM PROCEDURE TRAY) ×2 IMPLANT
PATTIES SURGICAL .5 X.5 (GAUZE/BANDAGES/DRESSINGS) ×6 IMPLANT
PATTIES SURGICAL .5 X1 (DISPOSABLE) ×2 IMPLANT
SPONGE SURGIFOAM ABS GEL 100 (HEMOSTASIS) ×2 IMPLANT
STAPLER VISISTAT 35W (STAPLE) IMPLANT
SURGIFLO W/THROMBIN 8M KIT (HEMOSTASIS) ×2 IMPLANT
SUT BONE WAX W31G (SUTURE) ×2 IMPLANT
SUT MON AB 3-0 SH 27 (SUTURE) ×1
SUT MON AB 3-0 SH27 (SUTURE) ×1 IMPLANT
SUT VIC AB 1 CT1 18XCR BRD 8 (SUTURE) ×1 IMPLANT
SUT VIC AB 1 CT1 27 (SUTURE)
SUT VIC AB 1 CT1 27XBRD ANTBC (SUTURE) IMPLANT
SUT VIC AB 1 CT1 8-18 (SUTURE) ×1
SUT VIC AB 2-0 CT1 18 (SUTURE) ×2 IMPLANT
SUT VICRYL 0 UR6 27IN ABS (SUTURE) IMPLANT
SYR BULB IRRIGATION 50ML (SYRINGE) ×2 IMPLANT
SYR CONTROL 10ML LL (SYRINGE) ×2 IMPLANT
SYR TB 1ML LUER SLIP (SYRINGE) ×4 IMPLANT
TOWEL GREEN STERILE (TOWEL DISPOSABLE) ×2 IMPLANT
TOWEL GREEN STERILE FF (TOWEL DISPOSABLE) ×2 IMPLANT
WATER STERILE IRR 1000ML POUR (IV SOLUTION) ×2 IMPLANT
YANKAUER SUCT BULB TIP NO VENT (SUCTIONS) IMPLANT

## 2018-11-21 NOTE — H&P (Signed)
Addendum dictation  Patient presents today with ongoing significant right neuropathic leg pain secondary to spinal stenosis and nerve irritation.  Despite appropriate conservative management she has failed to improve and as result we will moving forward with surgery.  All appropriate risks benefits and alternatives to surgery were discussed with the patient and consent was obtained.  There is been no change in her clinical exam since her last office visit 11/13/2018.

## 2018-11-21 NOTE — Op Note (Signed)
Operative report  Preoperative diagnosis: Lumbar spinal stenosis with right L3/4 L4/5 foraminal and lateral recess stenosis.  Postoperative diagnosis: Same  Operative procedure: L3-5 lumbar decompression: Right  L4 laminectomy, L3 laminotomy.  Right L3, L4, L5 foraminotomy.  Central decompression L3-L5.  Complications: None  First assistant: Cleta Alberts, PA  Indications: Tanya [name] is a very pleasant 50 woman with severe radicular right leg and back pain attempted conservative care failed patient imaging studies did show some right foraminal and lateral recess stenosis with possible nerve root limitation.  As a result of the failure of conservative care we elected to move forward with surgery.  All appropriate risks benefits and alternatives were discussed with the patient and consent was obtained  Operative report: Patient is brought the operating placed on the operating table.  After successful induction of general anesthesia and endotracheal patient teds SCDs were applied and she was turned prone onto the Wilson frame.  All bony prominences well-padded and the back was prepped and draped in a standard fashion.  Timeout was taken to confirm patient procedure and all other important data.  2 needles were placed into the back and an x-ray was taken for localization of the incision.  I marked out the incision site and infiltrated with quarter percent Marcaine with epinephrine.  Midline incision was made sharp dissection was carried out down to the deep fascia.  I incised the deep fascia and stripped the paraspinal muscles on the right side to expose the L3, L4, and L5 lamina.  I then exposed out laterally to the corresponding facet.  A Penfield 4 was then placed underneath the L4 lamina and a second x-ray was taken for localization.  Once I confirm the L4 lamina I then continued my decompression.  I then removed both of the L4 lamina with a double-action [employer] and then continued by laminectomy of  L4 with a 3 mm Kerrison.  Once I reached the insertion of the ligamentum flavum I then used my Penfield 4 to gently dissect through the ligamentum flavum and create a plane between the thecal sac and the ligamentum flavum.  I then excised the ligamentum flavum with the Kerrison rongeurs.  I was able to bend gently compressed the thecal sac and then work on to the contralateral side and remove some of the ligamentum flavum on the left-hand side.  I also was able to undercut the spinous process to adequately decompress centrally and slightly to the left.  I then proceeded into the right lateral recess.  L5 nerve root was excised and protected with a dural patty.  I then resected the medial aspect of the facet to adequately decompress the lateral recess.  Identified the epidural veins were coagulated with bipolar electrocautery.  I then continued my decompression inferiorly.  I was able to palpate and visualize the medial border of the L5 pedicle indicating he had an adequate lateral decompression.  The L5nerve root however was not as mobile as I expected it to be.  I then gently began dissecting into the L5 foramen and performing a foraminotomy with my Kerrison rongeurs.  I then noted that there was a very inferior bone spur which seem to be compressing the L5 nerve root.  I then resected the disc fragment.   With the foraminotomy completed I could now adequately and freely mobilize the L5 nerve root.  It was no longer under compression.  At this point I then performed a laminotomy of L3 with my 3 mm Kerrison rongeurs.  I then dissected through the ligamentum flavum and resected this.  Just laid down on the contralateral side I was able to protect the thecal sac with a Penfield 4 and then start to remove some of the ligamentum flavum from the contralateral left side.  I could now easily get into the lateral recess on the right side and take down the medial aspect of the facet.  There was lateral recess compression  consistent with what was seen on preoperative MRI and it was causing some compression and irritation to the L4 nerve root.  As result I elected to complete the laminotomy of L4 so I can adequately decompress the L4 nerve root.  A neuro patty was placed under the remaining portion of the L4 lamina and then I resected it with the Kerrison rongeurs.  Complete the L4 lamina completely resected on the right-hand side I could now easily visualize the L4 nerve root in the lateral recess at the 3 4 level then coming into the L4 foramen.  I removed the excess ligamentum flavum and lamina so that the L4 nerve root was adequately decompressed.  I made sure to leave the pars intact to prevent iatrogenic instability.  The undersurface of the pars was resected in order to adequately create room in the foramen but I made sure not to remove an excessive amount that would result in pars fracture.  At this point I can now freely palpate out the L3, L4, and L5 foramen on the right side.  The thecal sac was no longer under compression and it was easily mobile.  At this point I was pleased with the overall decompression.  I then used bipolar cautery and FloSeal to obtain hemostasis.  Once I had hemostasis I irrigated copiously normal saline and then placed 1 cc of Depo-Medrol (40 mg) over the exposed nerve roots in order to provide adequate postoperative analgesia.  I then placed a thrombin-soaked Gelfoam patty over the entire decompression site spanning from L3 down to L5.  The retractor blades were removed and I made sure that hemostasis.  The deep fascia was closed with #1 Vicryl suture.  The superficial was closed with a layer of 0 running, then 2-0 interrupted, then a 3-0 Monocryl for the skin.  Steri-Strips and dry dressing were applied and the patient was ultimately extubated transferred to PACU without incident.

## 2018-11-21 NOTE — Transfer of Care (Signed)
Immediate Anesthesia Transfer of Care Note  Patient: Tanya [name]  Procedure(s) Performed: RIGHT LUMBAR THREE THROUGH FOUR, LUMBAR FOUR THROUGH FIVE DECOMPRESSION (Right Spine Lumbar)  Patient Location: PACU  Anesthesia Type:General  Level of Consciousness: awake and patient cooperative  Airway & Oxygen Therapy: Patient Spontanous Breathing and Patient connected to face mask oxygen  Post-op Assessment: Report given to RN and Post -op Vital signs reviewed and stable  Post vital signs: Reviewed and stable  Last Vitals:  Vitals Value Taken Time  BP 127/76 11/21/18 1204  Temp    Pulse 99 11/21/18 1206  Resp 16 11/21/18 1206  SpO2 98 % 11/21/18 1206  Vitals shown include unvalidated device data.  Last Pain:  Vitals:   11/21/18 0745  TempSrc:   PainSc: 8       Patients Stated Pain Goal: 3 ([id number])  Complications: No apparent anesthesia complications

## 2018-11-21 NOTE — Anesthesia Postprocedure Evaluation (Signed)
Anesthesia Post Note  Patient: Tanya [name]  Procedure(s) Performed: RIGHT LUMBAR THREE THROUGH FOUR, LUMBAR FOUR THROUGH FIVE DECOMPRESSION (Right Spine Lumbar)     Patient location during evaluation: PACU Anesthesia Type: General Level of consciousness: awake and alert Pain management: pain level controlled Vital Signs Assessment: post-procedure vital signs reviewed and stable Respiratory status: spontaneous breathing, nonlabored ventilation and respiratory function stable Cardiovascular status: blood pressure returned to baseline and stable Postop Assessment: no apparent nausea or vomiting Anesthetic complications: no    Last Vitals:  Vitals:   11/21/18 1315 11/21/18 1401  BP:  125/74  Pulse: 91 94  Resp: 20 20  Temp: 36.9 C 36.7 C  SpO2: 97% 97%    Last Pain:  Vitals:   11/21/18 1401  TempSrc: Oral  PainSc:                  [name]

## 2018-11-21 NOTE — Anesthesia Procedure Notes (Signed)
Procedure Name: Intubation Date/Time: 11/21/2018 9:13 AM Performed by: Kathryne Hitch, CRNA Pre-anesthesia Checklist: Patient identified, Emergency Drugs available, Suction available and Patient being monitored Patient Re-evaluated:Patient Re-evaluated prior to induction Oxygen Delivery Method: Circle system utilized Preoxygenation: Pre-oxygenation with 100% oxygen Induction Type: IV induction Ventilation: Mask ventilation without difficulty Laryngoscope Size: Miller and 2 Grade View: Grade I Tube type: Oral Tube size: 7.0 mm Number of attempts: 1 Airway Equipment and Method: Stylet and Oral airway Placement Confirmation: ETT inserted through vocal cords under direct vision,  positive ETCO2 and breath sounds checked- equal and bilateral Secured at: 21 cm Tube secured with: Tape Dental Injury: Teeth and Oropharynx as per pre-operative assessment

## 2018-11-21 NOTE — Brief Op Note (Signed)
11/21/2018  11:33 AM  PATIENT:  Tanya [name]  48 y.o. female  PRE-OPERATIVE DIAGNOSIS:  Lumbar spinal stenosis  POST-OPERATIVE DIAGNOSIS:  Lumbar spinal stenosis  PROCEDURE:  Procedure(s) with comments: RIGHT LUMBAR THREE THROUGH FOUR, LUMBAR FOUR THROUGH FIVE DECOMPRESSION (Right) - 150 mins  SURGEON:  Surgeon(s) and Role:    Melina Schools, MD - Primary  PHYSICIAN ASSISTANT:   ASSISTANTS: Amanda Ward, PA    ANESTHESIA:   general  EBL:  175 mL   BLOOD ADMINISTERED:none  DRAINS: none   LOCAL MEDICATIONS USED:  MARCAINE     SPECIMEN:  No Specimen  DISPOSITION OF SPECIMEN:  N/A  COUNTS:  YES  TOURNIQUET:  * No tourniquets in log *  DICTATION: .Dragon Dictation  PLAN OF CARE: Admit for overnight observation  PATIENT DISPOSITION:  PACU - hemodynamically stable.

## 2018-11-21 NOTE — Discharge Instructions (Signed)
Surgical Spinal Decompression, Care After Refer to this sheet in the next few weeks. These instructions provide you with information about caring for yourself after your procedure. Your health care provider may also give you more specific instructions. Your treatment has been planned according to current medical practices, but problems sometimes occur. Call your health care provider if you have any problems or questions after your procedure. What can I expect after the procedure? It is common to have pain for the first few days after the procedure. Some people continue to have mild pain even after making a full recovery. Follow these instructions at home: Medicine  Take medicines only as directed by your health care provider.  Pain medications per pain management provider  Avoid taking over-the-counter pain medicines unless your health care provider tells you otherwise. These medicines interfere with the development and growth of new bone cells.  If you were prescribed a narcotic pain medicine, take it exactly as told by your health care provider. ? Do not drink alcohol while on the medicine. ? Do not drive while on the medicine. Injury care  Care for your back brace as told by your health care provider.  If directed, apply ice to the injured area: ? Put ice in a plastic bag. ? Place a towel between your skin and the bag. ? Leave the ice on for 20 minutes, 2-3 times a day. Activity  Perform physical therapy exercises as told by your health care provider.  Exercise regularly. Start by taking short walks. Slowly increase your activity level over time. Gentle exercise helps to ease pain.  Sit, stand, walk, turn in bed, and reposition yourself as told by your health care provider. This will help to keep your spine in proper alignment.  Avoid bending and twisting your body.  Avoid doing strenuous household chores, such as vacuuming.  Do not lift anything that is heavier than 10 lb (4.5  kg). Other Instructions  Keep all follow-up visits as directed by your health care provider. This is important.  Do not use any tobacco products, including cigarettes, chewing tobacco, or electronic cigarettes. If you need help quitting, ask your health care provider. Nicotine affects the way bones heal. Contact a health care provider if:  Your pain gets worse.  You have a fever.  You have redness, swelling, or pain at the site of your incision.  You have fluid, blood, or pus coming from your incision.  You have numbness, tingling, or weakness in any part of your body. Get help right away if:  Your incision feels swollen and tender, and the surrounding area looks like a lump. The lump may be red or bluish in color.  You cannot move any part of your body (paralysis).  You cannot control your bladder or bowels. This information is not intended to replace advice given to you by your health care provider. Make sure you discuss any questions you have with your health care provider.  Please contact your pain management provider for additional medications prior to the five (5) day supply ending.

## 2018-11-21 NOTE — Evaluation (Signed)
Physical Therapy Evaluation Patient Details Name: Tanya [name] MRN: [id number] DOB: 1970/10/23 Today's Date: 11/21/2018   History of Present Illness  Patient is a 48 y/o female admitted with SS now s/p L3-4, L4-5 decompression.  Past medical history significant for diabetes, asthma, IBD, thyroid issues, anxiety, complex regional pain syndrome of the upper extremity.  Clinical Impression  Patient presents with decreased mobility due to deficits listed in PT problem list.  Currently min A for ambulation but feel with RW may perform more independently.  Will follow up during acute stay for walker training and stair training prior to d/c.  No initial  follow up PT needed.     Follow Up Recommendations No PT follow up    Equipment Recommendations  Rolling walker with 5" wheels(youth RW)    Recommendations for Other Services       Precautions / Restrictions Precautions Precautions: Fall;Back Precaution Booklet Issued: Yes (comment) Required Braces or Orthoses: Spinal Brace Spinal Brace: Lumbar corset;Applied in sitting position      Mobility  Bed Mobility Overal bed mobility: Needs Assistance Bed Mobility: Rolling;Sidelying to Sit;Sit to Sidelying Rolling: Supervision Sidelying to sit: Supervision     Sit to sidelying: Mod assist General bed mobility comments: guided legs into bed to sidelying; cues for technique  Transfers Overall transfer level: Needs assistance   Transfers: Sit to/from Stand Sit to Stand: Min assist         General transfer comment: assist for balance and due to c/o pain with transition, but was able to stand from toilet in bathroom on her own  Ambulation/Gait Ambulation/Gait assistance: Min assist Gait Distance (Feet): 350 Feet Assistive device: 1 person hand held assist Gait Pattern/deviations: Step-to pattern;Step-through pattern;Decreased stride length;Decreased dorsiflexion - right;Wide base of support     General Gait Details: slow and HHA  for balance, with wider BOS and occasional dragging R foot cues to pick up  Stairs            Wheelchair Mobility    Modified Rankin (Stroke Patients Only)       Balance Overall balance assessment: Mild deficits observed, not formally tested                                           Pertinent Vitals/Pain Pain Assessment: Faces Faces Pain Scale: Hurts whole lot Pain Location: with sit to stand in surgical area Pain Descriptors / Indicators: Grimacing;Operative site guarding Pain Intervention(s): Monitored during session;Repositioned;Ice applied    Home Living Family/patient expects to be discharged to:: Private residence Living Arrangements: Spouse/significant other Available Help at Discharge: Family Type of Home: House Home Access: Stairs to enter Entrance Stairs-Rails: Psychiatric nurse of Steps: 3 Home Layout: Two level;Able to live on main level with bedroom/bathroom Home Equipment: None      Prior Function Level of Independence: Independent         Comments: dragging her leg     Hand Dominance        Extremity/Trunk Assessment   Upper Extremity Assessment Upper Extremity Assessment: RUE deficits/detail RUE Deficits / Details: reports carpal tunnel in R hand    Lower Extremity Assessment Lower Extremity Assessment: Generalized weakness       Communication   Communication: No difficulties  Cognition Arousal/Alertness: Awake/alert Behavior During Therapy: WFL for tasks assessed/performed Overall Cognitive Status: Within Functional Limits for tasks assessed  General Comments: slow to arouse initially as asleep, repeated story during session      General Comments General comments (skin integrity, edema, etc.): able to wash hands in bathroom after toileting, education on using toilet aid if needed to avoid twisting    Exercises     Assessment/Plan    PT  Assessment Patient needs continued PT services  PT Problem List Decreased balance;Decreased mobility;Decreased strength;Decreased knowledge of use of DME;Pain       PT Treatment Interventions Gait training;Stair training;DME instruction;Functional mobility training;Therapeutic activities;Patient/family education    PT Goals (Current goals can be found in the Care Plan section)  Acute Rehab PT Goals Patient Stated Goal: to return to independent PT Goal Formulation: With patient Time For Goal Achievement: 11/28/18 Potential to Achieve Goals: Good    Frequency Min 5X/week   Barriers to discharge        Co-evaluation               AM-PAC PT "6 Clicks" Mobility  Outcome Measure Help needed turning from your back to your side while in a flat bed without using bedrails?: A Little Help needed moving from lying on your back to sitting on the side of a flat bed without using bedrails?: A Little Help needed moving to and from a bed to a chair (including a wheelchair)?: A Little Help needed standing up from a chair using your arms (e.g., wheelchair or bedside chair)?: A Little Help needed to walk in hospital room?: A Little Help needed climbing 3-5 steps with a railing? : A Little 6 Click Score: 18    End of Session   Activity Tolerance: Patient tolerated treatment well Patient left: in bed;with call bell/phone within reach Nurse Communication: Mobility status PT Visit Diagnosis: Difficulty in walking, not elsewhere classified (R26.2)    Time: 1710-1800 PT Time Calculation (min) (ACUTE ONLY): 50 min   Charges:   PT Evaluation $PT Eval Low Complexity: 1 Low PT Treatments $Gait Training: 8-22 mins $Therapeutic Activity: 8-22 mins        Magda Kiel, PT Acute Rehabilitation Services [phone] 11/21/2018   Reginia Naas 11/21/2018, 6:12 PM

## 2018-11-22 ENCOUNTER — Encounter (HOSPITAL_COMMUNITY): Payer: Self-pay | Admitting: Orthopedic Surgery

## 2018-11-22 DIAGNOSIS — M48061 Spinal stenosis, lumbar region without neurogenic claudication: Secondary | ICD-10-CM | POA: Diagnosis not present

## 2018-11-22 LAB — GLUCOSE, CAPILLARY: Glucose-Capillary: 193 mg/dL — ABNORMAL HIGH (ref 70–99)

## 2018-11-22 LAB — HEMOGLOBIN A1C
Hgb A1c MFr Bld: 5.4 % (ref 4.8–5.6)
Mean Plasma Glucose: 108 mg/dL

## 2018-11-22 MED ORDER — METHOCARBAMOL 500 MG PO TABS: 500.0000 mg | ORAL_TABLET | Freq: Three times a day (TID) | ORAL | 0 refills | Status: AC | PRN

## 2018-11-22 MED FILL — Thrombin (Recombinant) For Soln 20000 Unit: CUTANEOUS | Qty: 1 | Status: AC

## 2018-11-22 NOTE — Plan of Care (Signed)
Patient alert and oriented, mae's well, voiding adequate amount of urine, swallowing without difficulty, c/o mild pain at time of discharge and ice applied to area for comfort. Patient discharged home with family. Script and discharged instructions given to patient. Patient and family stated understanding of instructions given. Patient has an appointment with Dr. Rolena Infante

## 2018-11-22 NOTE — Progress Notes (Signed)
    Subjective: Procedure(s) (LRB): RIGHT LUMBAR THREE THROUGH FOUR, LUMBAR FOUR THROUGH FIVE DECOMPRESSION (Right) 1 Day Post-Op  Patient reports pain as 3 on 0-10 scale.  Reports decreased leg pain reports incisional back pain   Positive void Negative bowel movement Positive flatus Negative chest pain or shortness of breath  Objective: Vital signs in last 24 hours: Temp:  [97.7 F (36.5 C)-98.4 F (36.9 C)] 98.4 F (36.9 C) (09/10 0439) Pulse Rate:  [56-96] 73 (09/10 0757) Resp:  [7-20] 20 (09/10 0439) BP: (97-136)/(52-88) 97/52 (09/10 0757) SpO2:  [93 %-99 %] 97 % (09/10 0757)  Intake/Output from previous day: 09/09 0701 - 09/10 0700 In: 1050 [I.V.:700; IV Piggyback:250] Out: 300 [Blood:300]  Labs: No results for input(s): WBC, RBC, HCT, PLT in the last 72 hours. No results for input(s): NA, K, CL, CO2, BUN, CREATININE, GLUCOSE, CALCIUM in the last 72 hours. No results for input(s): LABPT, INR in the last 72 hours.  Physical Exam: Neurologically intact ABD soft Intact pulses distally Incision: dressing C/D/I and no drainage Compartment soft Body mass index is 34.01 kg/m.   Assessment/Plan: Patient stable  xrays n/a Continue mobilization with physical therapy Continue care  Advance diet Up with therapy  1. Overall patient is improving.  Radicular/neuropathic leg pain improved.  Complains of incisional back pain 2. Tolerating reg diet  3. Ambulating without difficulty 4. Plan on d/c to home.  Pt has dilaudid at home.  Will d/c with zofran and robaxcin  Melina Schools, MD [facility] [phone]

## 2018-11-22 NOTE — Evaluation (Signed)
Occupational Therapy Evaluation Patient Details Name: Tanya [name] MRN: [id number] DOB: [birth year] Today's Date: 11/22/2018    History of Present Illness Patient is a 48 y/o female admitted with SS now s/p L3-4, L4-5 decompression.  Past medical history significant for diabetes, asthma, IBD, thyroid issues, anxiety, complex regional pain syndrome of the upper extremity.   Clinical Impression   Pt PTA: living with family and independent. Pt currently limited by pain in low back and burning at incision site- ice applied which has helped. Pt currently provided pt education provided for LB ADL with hip kit. Pt donning/doffing clothes with AE provided. Pt performing grooming at sink with no difficulties. Pt reporting not being able to reach for toilet hygiene and toilet aid education performed.  Back handout provided and reviewed ADL in detail. Pt educated on: clothing between brace, never sleep in brace, set an alarm at night for medication, avoid sitting for long periods of time, correct bed positioning for sleeping, correct sequence for bed mobility, avoiding lifting more than 5 pounds and never wash directly over incision. All education is complete and patient indicates understanding. Pt does not require continued OT skilled services. OT signing off.      Follow Up Recommendations  No OT follow up    Equipment Recommendations  Other (comment)(RW)    Recommendations for Other Services       Precautions / Restrictions Precautions Precautions: Fall;Back Precaution Booklet Issued: Yes (comment) Precaution Comments: verbally discussed handout Required Braces or Orthoses: Spinal Brace Spinal Brace: Lumbar corset;Applied in sitting position Restrictions Weight Bearing Restrictions: No      Mobility Bed Mobility Overal bed mobility: Needs Assistance Bed Mobility: Rolling;Sidelying to Sit;Sit to Sidelying Rolling: Supervision Sidelying to sit: Supervision     Sit to sidelying:  Supervision General bed mobility comments: no assist for BLEs to get onto bed  Transfers Overall transfer level: Needs assistance   Transfers: Sit to/from Stand Sit to Stand: Supervision         General transfer comment: requiring RW    Balance Overall balance assessment: Mild deficits observed, not formally tested                                         ADL either performed or assessed with clinical judgement   ADL Overall ADL's : Needs assistance/impaired Eating/Feeding: Independent   Grooming: Supervision/safety   Upper Body Bathing: Supervision/ safety;Set up   Lower Body Bathing: Min guard;With adaptive equipment;Cueing for safety;Cueing for sequencing;Sitting/lateral leans;Sit to/from stand   Upper Body Dressing : Supervision/safety;Set up;Sitting   Lower Body Dressing: Min guard;Cueing for safety;Cueing for sequencing;Sitting/lateral leans;Sit to/from stand;With adaptive equipment   Toilet Transfer: Min guard;Cueing for safety;Cueing for sequencing;Regular Toilet;Grab bars   Toileting- Water quality scientist and Hygiene: Min guard;With adaptive equipment;Cueing for safety;Cueing for sequencing;Sitting/lateral lean;Sit to/from stand   Tub/ Shower Transfer: Min guard;Cueing for Immunologist Details (indicate cue type and reason): Simulation of steps in/out of tub in room Functional mobility during ADLs: Supervision/safety;Rolling walker General ADL Comments: Pt education provided for LB ADL with hip kit. Pt donning/doffing clothes with AE provided. Pt performing grooming at sink with no difficulties.     Vision Baseline Vision/History: No visual deficits Vision Assessment?: No apparent visual deficits     Perception     Praxis      Pertinent Vitals/Pain Pain Assessment: 0-10 Pain Score: 6  Pain Location:  hips and back Pain Descriptors / Indicators: Grimacing;Operative site guarding Pain Intervention(s): Limited activity  within patient's tolerance     Hand Dominance Right   Extremity/Trunk Assessment Upper Extremity Assessment Upper Extremity Assessment: Generalized weakness RUE Deficits / Details: reports carpal tunnel in R hand   Lower Extremity Assessment Lower Extremity Assessment: Generalized weakness   Cervical / Trunk Assessment Cervical / Trunk Assessment: Other exceptions Cervical / Trunk Exceptions: s/p back sx   Communication Communication Communication: No difficulties   Cognition Arousal/Alertness: Awake/alert Behavior During Therapy: WFL for tasks assessed/performed Overall Cognitive Status: Within Functional Limits for tasks assessed                                     General Comments  Pt shown AE, especially for toileting needs.    Exercises     Shoulder Instructions      Home Living Family/patient expects to be discharged to:: Private residence Living Arrangements: Spouse/significant other Available Help at Discharge: Family Type of Home: House Home Access: Stairs to enter [employer] of Steps: 3 Entrance Stairs-Rails: Right;Left Home Layout: Two level;Able to live on main level with bedroom/bathroom     Bathroom Shower/Tub: Walk-in shower;Tub/shower unit   Bathroom Toilet: Standard     Home Equipment: None          Prior Functioning/Environment Level of Independence: Independent                 OT Problem List: Decreased activity tolerance      OT Treatment/Interventions:      OT Goals(Current goals can be found in the care plan section) Acute Rehab OT Goals Patient Stated Goal: to return to independent OT Goal Formulation: With patient Time For Goal Achievement: 12/06/18 Potential to Achieve Goals: Good  OT Frequency:     Barriers to D/C:            Co-evaluation              AM-PAC OT "6 Clicks" Daily Activity     Outcome Measure Help from another person eating meals?: None Help from another  person taking care of personal grooming?: None Help from another person toileting, which includes using toliet, bedpan, or urinal?: A Little Help from another person bathing (including washing, rinsing, drying)?: A Little Help from another person to put on and taking off regular upper body clothing?: None Help from another person to put on and taking off regular lower body clothing?: A Little 6 Click Score: 21   End of Session Equipment Utilized During Treatment: Rolling walker;Back brace Nurse Communication: Mobility status  Activity Tolerance: Patient tolerated treatment well Patient left: in bed;with call bell/phone within reach  OT Visit Diagnosis: Muscle weakness (generalized) (M62.81);Pain Pain - part of body: (back)                Time: 5525-8948 OT Time Calculation (min): 49 min Charges:  OT General Charges $OT Visit: 1 Visit OT Evaluation $OT Eval Moderate Complexity: 1 Mod OT Treatments $Self Care/Home Management : 23-37 mins  Ebony Hail Harold Hedge) Marsa Aris OTR/L Acute Rehabilitation Services Pager: [phone] Office: [place] 11/22/2018, 10:38 AM

## 2018-11-22 NOTE — Progress Notes (Signed)
Physical Therapy Treatment Patient Details Name: Tanya [name] MRN: [id number] DOB: [birth year] Today's Date: 11/22/2018    History of Present Illness Patient is a 48 y/o female admitted with SS now s/p L3-4, L4-5 decompression.  Past medical history significant for diabetes, asthma, IBD, thyroid issues, anxiety, complex regional pain syndrome of the upper extremity.    PT Comments    Pt supine on arrival with HOB 45 degrees and brace on. Pt educated for positioning, transfers, gait, rW use, brace wear and to not lie on brace. Pt with increased time required to discuss home mobility given pt will be sleeping on adjustable couch with its own barriers to mobility. Pt requires cues to prevent twisting with functional activity. Will continue to follow acutely.    Follow Up Recommendations  No PT follow up     Equipment Recommendations  Rolling walker with 5" wheels    Recommendations for Other Services       Precautions / Restrictions Precautions Precautions: Fall;Back Precaution Booklet Issued: Yes (comment) Precaution Comments: verbally discussed handout Required Braces or Orthoses: Spinal Brace Spinal Brace: Lumbar corset;Applied in sitting position Restrictions Weight Bearing Restrictions: No    Mobility  Bed Mobility Overal bed mobility: Needs Assistance Bed Mobility: Rolling;Sidelying to Sit;Sit to Sidelying Rolling: Supervision Sidelying to sit: Supervision     Sit to sidelying: Supervision General bed mobility comments: cues for sequence with increased time discussing variation since pt will sleep on a semiadjustable couch at home not a bed  Transfers Overall transfer level: Needs assistance   Transfers: Sit to/from Stand Sit to Stand: Supervision         General transfer comment: cues for hand placement and posture  Ambulation/Gait Ambulation/Gait assistance: Min guard Gait Distance (Feet): 400 Feet Assistive device: Rolling walker (2 wheeled) Gait  Pattern/deviations: Step-through pattern;Decreased stride length;Trunk flexed   Gait velocity interpretation: 1.31 - 2.62 ft/sec, indicative of limited community ambulator General Gait Details: cues for posture, proximity to RW and safety with 2 standing rests   Stairs Stairs: Yes Stairs assistance: Supervision Stair Management: One rail Right;Step to pattern;Sideways Number of Stairs: 4 General stair comments: cues for sequence with use of rail sideways   Wheelchair Mobility    Modified Rankin (Stroke Patients Only)       Balance Overall balance assessment: Mild deficits observed, not formally tested                                          Cognition Arousal/Alertness: Awake/alert Behavior During Therapy: WFL for tasks assessed/performed Overall Cognitive Status: Within Functional Limits for tasks assessed                                        Exercises      General Comments       Pertinent Vitals/Pain Pain Assessment: 0-10 Pain Score: 4  Pain Location: back, RUE with RW use Pain Descriptors / Indicators: Aching;Operative site guarding Pain Intervention(s): Limited activity within patient's tolerance;Monitored during session;Repositioned    Home Living Family/patient expects to be discharged to:: Private residence Living Arrangements: Spouse/significant other Available Help at Discharge: Family Type of Home: House Home Access: Stairs to enter Entrance Stairs-Rails: Right;Left Home Layout: Two level;Able to live on main level with bedroom/bathroom Home Equipment: None  Prior Function Level of Independence: Independent          PT Goals (current goals can now be found in the care plan section) Acute Rehab PT Goals Patient Stated Goal: to return to independent Progress towards PT goals: Progressing toward goals    Frequency    Min 5X/week      PT Plan Current plan remains appropriate    Co-evaluation               AM-PAC PT "6 Clicks" Mobility   Outcome Measure  Help needed turning from your back to your side while in a flat bed without using bedrails?: A Little Help needed moving from lying on your back to sitting on the side of a flat bed without using bedrails?: A Little Help needed moving to and from a bed to a chair (including a wheelchair)?: A Little Help needed standing up from a chair using your arms (e.g., wheelchair or bedside chair)?: A Little Help needed to walk in hospital room?: A Little Help needed climbing 3-5 steps with a railing? : A Little 6 Click Score: 18    End of Session Equipment Utilized During Treatment: Back brace Activity Tolerance: Patient tolerated treatment well Patient left: in bed;with call bell/phone within reach Nurse Communication: Mobility status PT Visit Diagnosis: Difficulty in walking, not elsewhere classified (R26.2)     Time: 1000-1036 PT Time Calculation (min) (ACUTE ONLY): 36 min  Charges:  $Gait Training: 8-22 mins $Therapeutic Activity: 8-22 mins                     E. Lopez Pager: [phone] Office: [place] 11/22/2018, 10:49 AM

## 2018-11-23 NOTE — Discharge Summary (Signed)
Patient ID: Tanya [name] MRN: [id number] DOB/AGE: [birth year] 48 y.o.  Admit date: 11/21/2018 Discharge date: 11/23/2018  Admission Diagnoses:  Active Problems:   Spinal stenosis   Discharge Diagnoses:  Active Problems:   Spinal stenosis  status post Procedure(s): RIGHT LUMBAR THREE THROUGH FOUR, LUMBAR FOUR THROUGH FIVE DECOMPRESSION  Past Medical History:  Diagnosis Date  . ADD (attention deficit disorder)   . Anemia   . Anxiety   . Arthritis   . Asthma   . Blood in stool   . Chickenpox   . Complex regional pain syndrome of right upper extremity   . Depression   . Frequent headaches    migraines  . Heart murmur   . Hypertension   . Hypothyroidism   . IBS (irritable bowel syndrome)   . Insulin resistance   . Lumbar radiculopathy   . Mononucleosis   . PONV (postoperative nausea and vomiting)   . Seasonal allergies   . Shingles   . Spinal stenosis, lumbar   . UTI (urinary tract infection)   . Wears glasses     Surgeries: Procedure(s): RIGHT LUMBAR THREE THROUGH FOUR, LUMBAR FOUR THROUGH FIVE DECOMPRESSION on 11/21/2018   Consultants:   Discharged Condition: Improved  Hospital Course: Tanya [name] is an 48 y.o. female who was admitted 11/21/2018 for operative treatment of Lumbar spinal stenosis with right L3/4 L4/5 foraminal and lateral recess stenosis.. Patient failed conservative treatments (please see the history and physical for the specifics) and had severe unremitting pain that affects sleep, daily activities and work/hobbies. After pre-op clearance, the patient was taken to the operating room on 11/21/2018 and underwent  Procedure(s): Donora.    Patient was given perioperative antibiotics:  Anti-infectives (From admission, onward)   Start     Dose/Rate Route Frequency Ordered Stop   11/21/18 1700  ceFAZolin (ANCEF) IVPB 1 g/50 mL premix     1 g 100 mL/hr over 30 Minutes Intravenous Every 8  hours 11/21/18 1400 11/22/18 0059   11/21/18 0730  ceFAZolin (ANCEF) IVPB 2g/100 mL premix     2 g 200 mL/hr over 30 Minutes Intravenous To Short Stay 11/20/18 0845 11/21/18 0930       Patient was given sequential compression devices and early ambulation to prevent DVT.   Patient benefited maximally from hospital stay and there were no complications. At the time of discharge, the patient was urinating/moving their bowels without difficulty, tolerating a regular diet, pain is controlled with oral pain medications and they have been cleared by PT/OT.   Recent vital signs: No data found.   Recent laboratory studies: No results for input(s): WBC, HGB, HCT, PLT, NA, K, CL, CO2, BUN, CREATININE, GLUCOSE, INR, CALCIUM in the last 72 hours.  Invalid input(s): PT, 2   Discharge Medications:   Allergies as of 11/22/2018      Reactions   Hydrocodone-acetaminophen Hives, Itching, Other (See Comments)      Oxycodone-acetaminophen Hives, Itching, Other (See Comments)      Pertussis Vaccines Other (See Comments)   unknown      Medication List    STOP taking these medications   ALPRAZolam XR 1 MG 24 hr tablet Generic drug: ALPRAZolam   clindamycin-benzoyl peroxide gel Commonly known as: BENZACLIN   doxycycline 20 MG tablet Commonly known as: PERIOSTAT   Melatonin 5 MG Caps   multivitamin with minerals Tabs tablet   OVER THE COUNTER MEDICATION   Tonalin CLA 1000  MG Caps   tretinoin 0.025 % cream Commonly known as: RETIN-A   TURMERIC PO   vitamin B-12 1000 MCG tablet Commonly known as: CYANOCOBALAMIN   White Kidney Bean 500 MG Caps     TAKE these medications   albuterol 108 (90 Base) MCG/ACT inhaler Commonly known as: VENTOLIN HFA INHALE 2 PUFFS INTO THE LUNGS EVERY 6 HOURS AS NEEDED FOR WHEEZING OR SHORTNESS OF BREATH What changed: See the new instructions.   Alli 60 MG capsule Generic drug: orlistat Take 60-120 mg by mouth 3 (three) times daily with meals. Do not  exceed 3 capsules daily.   amphetamine-dextroamphetamine 20 MG tablet Commonly known as: ADDERALL Take 20 mg by mouth See admin instructions. Take 1 tablet daily and 1 tablet at 1300.   amphetamine-dextroamphetamine 20 MG 24 hr capsule Commonly known as: ADDERALL XR Take 20 mg by mouth daily.   buPROPion 300 MG 24 hr tablet Commonly known as: WELLBUTRIN XL Take 300 mg by mouth daily. NAME BRAND ONLY   buPROPion 150 MG 24 hr tablet Commonly known as: WELLBUTRIN XL Take 150 mg by mouth See admin instructions. Take 1 tablet in the morning and 1 tablet at 1300   cetirizine 10 MG tablet Commonly known as: ZYRTEC Take 10 mg by mouth at bedtime.   doxepin 50 MG capsule Commonly known as: SINEQUAN Take 150 mg by mouth at bedtime.   hydrochlorothiazide 50 MG tablet Commonly known as: HYDRODIURIL Take 1 tablet (50 mg total) by mouth daily.   levothyroxine 75 MCG tablet Commonly known as: SYNTHROID TAKE 1 TABLET EVERY DAY ON EMPTY STOMACHWITH A GLASS OF WATER AT LEAST 30-60 MINBEFORE BREAKFAST What changed:   how much to take  how to take this  when to take this  additional instructions   metFORMIN 500 MG tablet Commonly known as: GLUCOPHAGE Take 500 mg by mouth See admin instructions. Take 1 tablet daily and 1 tablet at 1300.   methocarbamol 500 MG tablet Commonly known as: Robaxin Take 1 tablet (500 mg total) by mouth every 8 (eight) hours as needed for up to 5 days for muscle spasms.   ondansetron 4 MG tablet Commonly known as: Zofran Take 1 tablet (4 mg total) by mouth every 8 (eight) hours as needed for nausea or vomiting.   temazepam 15 MG capsule Commonly known as: RESTORIL Take 30 mg by mouth at bedtime as needed for sleep.   tiaGABine 4 MG tablet Commonly known as: GABITRIL Take 16 mg by mouth at bedtime.   venlafaxine XR 150 MG 24 hr capsule Commonly known as: EFFEXOR-XR Take 300 mg by mouth daily with breakfast.   VICTOZA [place] Inject 1.8 mLs into the  skin every evening.       Diagnostic Studies: Dg Chest 2 View  Result Date: 11/14/2018 CLINICAL DATA:  Back pain, preop evaluation for lumbar surgery EXAM: CHEST - 2 VIEW COMPARISON:  05/02/2010 FINDINGS: Cardiac shadows within normal limits. Spinal stimulator is noted as well as postsurgical changes in the cervical spine. The lungs are clear. No acute abnormality is noted. IMPRESSION: No active cardiopulmonary disease. Electronically Signed   By: Inez Catalina M.D.   On: 11/14/2018 15:13   Dg Lumbar Spine 1 View  Result Date: 11/21/2018 CLINICAL DATA:  LUMBAR DISC DISEASE. EXAM: LUMBAR SPINE - 1 VIEW COMPARISON:  NONE FINDINGS: Radiograph 1 at 9:06 a.m. demonstrates a needle at the level of the pedicles of L3 and a second needle at the level of the pedicles of  L4. IMPRESSION: Needles at L3 and L4 as described. Electronically Signed   By: Lorriane Shire M.D.   On: 11/21/2018 12:00   Dg Spine Portable 1 View  Result Date: 11/21/2018 CLINICAL DATA:  Lumbar decompression EXAM: PORTABLE SPINE - 1 VIEW COMPARISON:  None. FINDINGS: Lateral view of the lumbar spine was obtained with no prior comparison film. A surgical instrument and retractors are noted at the L4-5 interspace with vacuum disc phenomenon at that level. Numbering nomenclature is standard numbering nomenclature is no prior exams available for comparison. IMPRESSION: Lumbar localization at L4-5. Electronically Signed   By: Inez Catalina M.D.   On: 11/21/2018 09:36   Dg Fluoro Guide Spinal/si Jt Inj Right  Result Date: 10/30/2018 CLINICAL DATA:  Chronic right-sided low back and buttock pain. Right lower leg and foot numbness/tingling. EXAM: RIGHT SI JOINT INJECTION FLUOROSCOPY TIME:  Radiation Exposure Index (as provided by the fluoroscopic device): 5.0 mGy Fluoroscopy Time:  11 seconds Number of Acquired Images:  0 PROCEDURE: After a thorough discussion of risks and benefits of the procedure, including bleeding, infection, injury to nerves,  blood vessels, and adjacent structures, verbal and written consent was obtained. The patient was placed prone on the fluoroscopy table and localization was performed over the sacrum. Target site was marked using fluoroscopic guidance. The skin was prepped and draped in the usual sterile fashion using Betadine soap. After local anesthesia with 1% lidocaine without epinephrine and subsequent deep anesthesia, a 3.5 inch 22 gauge spinal needle was advanced into the right SI joint. Injection of 0.5 ml Isovue-M 200 confirmed intra-articular placement. No vascular uptake present. Subsequently, 120 mg of Depo-Medrol mixed with 0.5 mL of 0.5% bupivacaine were injected into the right SI joint. Needle was removed and a sterile dressing applied. No complications were observed. The patient was observed and released under the care of a driver after 30 minutes. IMPRESSION: Successful fluoroscopically guided right SI joint injection. Electronically Signed   By: Titus Dubin M.D.   On: 10/30/2018 12:27    Discharge Instructions    Incentive spirometry RT   Complete by: As directed       Follow-up [place], Dahari, MD In 2 weeks.   Specialty: Orthopedic Surgery Why: If symptoms worsen, For suture removal, For wound re-check Contact information: [address] [place] [address] [id number]           Discharge Plan:  discharge to home  Disposition: stable    Signed: Yvonne Kendall [facility] PA-C [facility] [phone] 11/23/2018, 8:16 AM

## 2018-12-03 DIAGNOSIS — G90519 Complex regional pain syndrome I of unspecified upper limb: Secondary | ICD-10-CM | POA: Diagnosis not present

## 2018-12-03 DIAGNOSIS — G894 Chronic pain syndrome: Secondary | ICD-10-CM | POA: Diagnosis not present

## 2018-12-03 DIAGNOSIS — M5417 Radiculopathy, lumbosacral region: Secondary | ICD-10-CM | POA: Diagnosis not present

## 2018-12-06 DIAGNOSIS — F411 Generalized anxiety disorder: Secondary | ICD-10-CM | POA: Diagnosis not present

## 2018-12-06 DIAGNOSIS — F3341 Major depressive disorder, recurrent, in partial remission: Secondary | ICD-10-CM | POA: Diagnosis not present

## 2018-12-06 DIAGNOSIS — F4312 Post-traumatic stress disorder, chronic: Secondary | ICD-10-CM | POA: Diagnosis not present

## 2018-12-21 ENCOUNTER — Ambulatory Visit: Payer: PPO

## 2018-12-24 ENCOUNTER — Other Ambulatory Visit: Payer: PPO

## 2018-12-27 ENCOUNTER — Encounter: Payer: PPO | Admitting: Primary Care

## 2018-12-28 ENCOUNTER — Telehealth: Payer: Self-pay

## 2018-12-28 ENCOUNTER — Ambulatory Visit: Payer: PPO

## 2018-12-28 NOTE — Telephone Encounter (Signed)
Called patient 3 times trying to complete her Medicare Wellness visit and never received an answer. Left a message patient's voicemail advising her that we will complete her Medicare visit at her physical or she can call the office back and we will get her rescheduled for a phone visit.

## 2018-12-31 DIAGNOSIS — M5417 Radiculopathy, lumbosacral region: Secondary | ICD-10-CM | POA: Diagnosis not present

## 2018-12-31 DIAGNOSIS — G894 Chronic pain syndrome: Secondary | ICD-10-CM | POA: Diagnosis not present

## 2018-12-31 DIAGNOSIS — G90519 Complex regional pain syndrome I of unspecified upper limb: Secondary | ICD-10-CM | POA: Diagnosis not present

## 2019-01-01 DIAGNOSIS — F3341 Major depressive disorder, recurrent, in partial remission: Secondary | ICD-10-CM | POA: Diagnosis not present

## 2019-01-01 DIAGNOSIS — F411 Generalized anxiety disorder: Secondary | ICD-10-CM | POA: Diagnosis not present

## 2019-01-01 DIAGNOSIS — F4312 Post-traumatic stress disorder, chronic: Secondary | ICD-10-CM | POA: Diagnosis not present

## 2019-01-18 DIAGNOSIS — Z79899 Other long term (current) drug therapy: Secondary | ICD-10-CM | POA: Diagnosis not present

## 2019-01-18 DIAGNOSIS — M545 Low back pain: Secondary | ICD-10-CM | POA: Diagnosis not present

## 2019-01-18 DIAGNOSIS — Z5181 Encounter for therapeutic drug level monitoring: Secondary | ICD-10-CM | POA: Diagnosis not present

## 2019-01-24 DIAGNOSIS — M545 Low back pain: Secondary | ICD-10-CM | POA: Diagnosis not present

## 2019-01-28 DIAGNOSIS — M545 Low back pain: Secondary | ICD-10-CM | POA: Diagnosis not present

## 2019-01-31 DIAGNOSIS — F411 Generalized anxiety disorder: Secondary | ICD-10-CM | POA: Diagnosis not present

## 2019-01-31 DIAGNOSIS — F3341 Major depressive disorder, recurrent, in partial remission: Secondary | ICD-10-CM | POA: Diagnosis not present

## 2019-01-31 DIAGNOSIS — M545 Low back pain: Secondary | ICD-10-CM | POA: Diagnosis not present

## 2019-01-31 DIAGNOSIS — F4312 Post-traumatic stress disorder, chronic: Secondary | ICD-10-CM | POA: Diagnosis not present

## 2019-02-04 DIAGNOSIS — M5416 Radiculopathy, lumbar region: Secondary | ICD-10-CM | POA: Diagnosis not present

## 2019-02-22 DIAGNOSIS — F411 Generalized anxiety disorder: Secondary | ICD-10-CM | POA: Diagnosis not present

## 2019-02-22 DIAGNOSIS — F3341 Major depressive disorder, recurrent, in partial remission: Secondary | ICD-10-CM | POA: Diagnosis not present

## 2019-02-22 DIAGNOSIS — F4312 Post-traumatic stress disorder, chronic: Secondary | ICD-10-CM | POA: Diagnosis not present

## 2019-02-23 DIAGNOSIS — F411 Generalized anxiety disorder: Secondary | ICD-10-CM | POA: Diagnosis not present

## 2019-02-23 DIAGNOSIS — F3341 Major depressive disorder, recurrent, in partial remission: Secondary | ICD-10-CM | POA: Diagnosis not present

## 2019-02-23 DIAGNOSIS — F4312 Post-traumatic stress disorder, chronic: Secondary | ICD-10-CM | POA: Diagnosis not present

## 2019-02-25 DIAGNOSIS — M5417 Radiculopathy, lumbosacral region: Secondary | ICD-10-CM | POA: Diagnosis not present

## 2019-02-25 DIAGNOSIS — G90519 Complex regional pain syndrome I of unspecified upper limb: Secondary | ICD-10-CM | POA: Diagnosis not present

## 2019-02-25 DIAGNOSIS — Z9689 Presence of other specified functional implants: Secondary | ICD-10-CM | POA: Diagnosis not present

## 2019-02-25 DIAGNOSIS — G894 Chronic pain syndrome: Secondary | ICD-10-CM | POA: Diagnosis not present

## 2019-03-01 DIAGNOSIS — H04123 Dry eye syndrome of bilateral lacrimal glands: Secondary | ICD-10-CM | POA: Diagnosis not present

## 2019-03-12 ENCOUNTER — Ambulatory Visit: Payer: PPO

## 2019-03-12 ENCOUNTER — Other Ambulatory Visit: Payer: Self-pay

## 2019-03-13 DIAGNOSIS — F411 Generalized anxiety disorder: Secondary | ICD-10-CM | POA: Diagnosis not present

## 2019-03-13 DIAGNOSIS — F4312 Post-traumatic stress disorder, chronic: Secondary | ICD-10-CM | POA: Diagnosis not present

## 2019-03-13 DIAGNOSIS — F3341 Major depressive disorder, recurrent, in partial remission: Secondary | ICD-10-CM | POA: Diagnosis not present

## 2019-03-14 ENCOUNTER — Ambulatory Visit (INDEPENDENT_AMBULATORY_CARE_PROVIDER_SITE_OTHER): Payer: PPO

## 2019-03-14 ENCOUNTER — Other Ambulatory Visit: Payer: Self-pay

## 2019-03-14 DIAGNOSIS — Z23 Encounter for immunization: Secondary | ICD-10-CM | POA: Diagnosis not present

## 2019-03-29 DIAGNOSIS — F3341 Major depressive disorder, recurrent, in partial remission: Secondary | ICD-10-CM | POA: Diagnosis not present

## 2019-03-29 DIAGNOSIS — F4312 Post-traumatic stress disorder, chronic: Secondary | ICD-10-CM | POA: Diagnosis not present

## 2019-03-29 DIAGNOSIS — F411 Generalized anxiety disorder: Secondary | ICD-10-CM | POA: Diagnosis not present

## 2019-04-02 DIAGNOSIS — F341 Dysthymic disorder: Secondary | ICD-10-CM | POA: Diagnosis not present

## 2019-04-02 DIAGNOSIS — R0602 Shortness of breath: Secondary | ICD-10-CM | POA: Diagnosis not present

## 2019-04-02 DIAGNOSIS — E1165 Type 2 diabetes mellitus with hyperglycemia: Secondary | ICD-10-CM | POA: Diagnosis not present

## 2019-04-02 DIAGNOSIS — L680 Hirsutism: Secondary | ICD-10-CM | POA: Diagnosis not present

## 2019-04-02 DIAGNOSIS — F909 Attention-deficit hyperactivity disorder, unspecified type: Secondary | ICD-10-CM | POA: Diagnosis not present

## 2019-04-02 DIAGNOSIS — E669 Obesity, unspecified: Secondary | ICD-10-CM | POA: Diagnosis not present

## 2019-04-02 DIAGNOSIS — R601 Generalized edema: Secondary | ICD-10-CM | POA: Diagnosis not present

## 2019-04-02 DIAGNOSIS — E039 Hypothyroidism, unspecified: Secondary | ICD-10-CM | POA: Diagnosis not present

## 2019-04-09 DIAGNOSIS — G9050 Complex regional pain syndrome I, unspecified: Secondary | ICD-10-CM | POA: Diagnosis not present

## 2019-04-09 DIAGNOSIS — F419 Anxiety disorder, unspecified: Secondary | ICD-10-CM | POA: Diagnosis not present

## 2019-04-09 DIAGNOSIS — L680 Hirsutism: Secondary | ICD-10-CM | POA: Diagnosis not present

## 2019-04-09 DIAGNOSIS — F341 Dysthymic disorder: Secondary | ICD-10-CM | POA: Diagnosis not present

## 2019-04-09 DIAGNOSIS — E1165 Type 2 diabetes mellitus with hyperglycemia: Secondary | ICD-10-CM | POA: Diagnosis not present

## 2019-04-09 DIAGNOSIS — E669 Obesity, unspecified: Secondary | ICD-10-CM | POA: Diagnosis not present

## 2019-04-09 DIAGNOSIS — E248 Other Cushing's syndrome: Secondary | ICD-10-CM | POA: Diagnosis not present

## 2019-04-09 DIAGNOSIS — F909 Attention-deficit hyperactivity disorder, unspecified type: Secondary | ICD-10-CM | POA: Diagnosis not present

## 2019-04-09 DIAGNOSIS — E039 Hypothyroidism, unspecified: Secondary | ICD-10-CM | POA: Diagnosis not present

## 2019-04-09 DIAGNOSIS — J45909 Unspecified asthma, uncomplicated: Secondary | ICD-10-CM | POA: Diagnosis not present

## 2019-04-22 ENCOUNTER — Other Ambulatory Visit: Payer: Self-pay | Admitting: Primary Care

## 2019-04-22 DIAGNOSIS — M5417 Radiculopathy, lumbosacral region: Secondary | ICD-10-CM | POA: Diagnosis not present

## 2019-04-22 DIAGNOSIS — Z9689 Presence of other specified functional implants: Secondary | ICD-10-CM | POA: Diagnosis not present

## 2019-04-22 DIAGNOSIS — G9050 Complex regional pain syndrome I, unspecified: Secondary | ICD-10-CM

## 2019-04-22 DIAGNOSIS — G90519 Complex regional pain syndrome I of unspecified upper limb: Secondary | ICD-10-CM | POA: Diagnosis not present

## 2019-04-22 DIAGNOSIS — G894 Chronic pain syndrome: Secondary | ICD-10-CM | POA: Diagnosis not present

## 2019-04-23 ENCOUNTER — Ambulatory Visit (HOSPITAL_COMMUNITY)
Admission: EM | Admit: 2019-04-23 | Discharge: 2019-04-23 | Disposition: A | Discharge status: Home / Self Care | Payer: PPO | Attending: Family Medicine | Admitting: Family Medicine

## 2019-04-23 ENCOUNTER — Encounter (HOSPITAL_COMMUNITY): Payer: Self-pay | Admitting: Emergency Medicine

## 2019-04-23 ENCOUNTER — Telehealth: Payer: Self-pay

## 2019-04-23 ENCOUNTER — Other Ambulatory Visit: Payer: Self-pay

## 2019-04-23 DIAGNOSIS — J4520 Mild intermittent asthma, uncomplicated: Secondary | ICD-10-CM | POA: Insufficient documentation

## 2019-04-23 DIAGNOSIS — R05 Cough: Secondary | ICD-10-CM

## 2019-04-23 DIAGNOSIS — J029 Acute pharyngitis, unspecified: Secondary | ICD-10-CM | POA: Diagnosis not present

## 2019-04-23 DIAGNOSIS — F419 Anxiety disorder, unspecified: Secondary | ICD-10-CM | POA: Insufficient documentation

## 2019-04-23 DIAGNOSIS — Z79890 Hormone replacement therapy (postmenopausal): Secondary | ICD-10-CM | POA: Insufficient documentation

## 2019-04-23 DIAGNOSIS — I10 Essential (primary) hypertension: Secondary | ICD-10-CM | POA: Insufficient documentation

## 2019-04-23 DIAGNOSIS — M1990 Unspecified osteoarthritis, unspecified site: Secondary | ICD-10-CM | POA: Diagnosis not present

## 2019-04-23 DIAGNOSIS — Z87891 Personal history of nicotine dependence: Secondary | ICD-10-CM | POA: Insufficient documentation

## 2019-04-23 DIAGNOSIS — G894 Chronic pain syndrome: Secondary | ICD-10-CM | POA: Diagnosis not present

## 2019-04-23 DIAGNOSIS — F329 Major depressive disorder, single episode, unspecified: Secondary | ICD-10-CM | POA: Diagnosis not present

## 2019-04-23 DIAGNOSIS — M48061 Spinal stenosis, lumbar region without neurogenic claudication: Secondary | ICD-10-CM | POA: Insufficient documentation

## 2019-04-23 DIAGNOSIS — J069 Acute upper respiratory infection, unspecified: Secondary | ICD-10-CM | POA: Insufficient documentation

## 2019-04-23 DIAGNOSIS — Z20822 Contact with and (suspected) exposure to covid-19: Secondary | ICD-10-CM | POA: Insufficient documentation

## 2019-04-23 DIAGNOSIS — Z7984 Long term (current) use of oral hypoglycemic drugs: Secondary | ICD-10-CM | POA: Insufficient documentation

## 2019-04-23 DIAGNOSIS — Z79899 Other long term (current) drug therapy: Secondary | ICD-10-CM | POA: Insufficient documentation

## 2019-04-23 DIAGNOSIS — Z8249 Family history of ischemic heart disease and other diseases of the circulatory system: Secondary | ICD-10-CM | POA: Insufficient documentation

## 2019-04-23 DIAGNOSIS — E282 Polycystic ovarian syndrome: Secondary | ICD-10-CM | POA: Diagnosis not present

## 2019-04-23 DIAGNOSIS — F4310 Post-traumatic stress disorder, unspecified: Secondary | ICD-10-CM | POA: Insufficient documentation

## 2019-04-23 DIAGNOSIS — R110 Nausea: Secondary | ICD-10-CM

## 2019-04-23 DIAGNOSIS — F988 Other specified behavioral and emotional disorders with onset usually occurring in childhood and adolescence: Secondary | ICD-10-CM | POA: Diagnosis not present

## 2019-04-23 DIAGNOSIS — K589 Irritable bowel syndrome without diarrhea: Secondary | ICD-10-CM | POA: Diagnosis not present

## 2019-04-23 DIAGNOSIS — E039 Hypothyroidism, unspecified: Secondary | ICD-10-CM | POA: Insufficient documentation

## 2019-04-23 MED ORDER — ALBUTEROL SULFATE HFA 108 (90 BASE) MCG/ACT IN AERS: 2.0000 | INHALATION_SPRAY | Freq: Four times a day (QID) | RESPIRATORY_TRACT | 1 refills | Status: DC | PRN

## 2019-04-23 MED ORDER — PREDNISONE 10 MG PO TABS: ORAL_TABLET | ORAL | 0 refills | Status: DC

## 2019-04-23 NOTE — Discharge Instructions (Addendum)
Your covid test is pending 

## 2019-04-23 NOTE — ED Triage Notes (Signed)
Pt sts N/V/D, body aches and fever with cough since 1/29; pt had negative covid test on 04/16/19; pt here for no improvement

## 2019-04-23 NOTE — ED Provider Notes (Signed)
[place]    CSN: [id number] Arrival date & time: 04/23/19  1832      History   Chief Complaint Chief Complaint  Patient presents with  . Cough  . Generalized Body Aches  . Nausea    HPI Tanya [name] is a 49 y.o. female.   The history is provided by the patient. No language interpreter was used.  Cough Cough characteristics:  Non-productive Sputum characteristics:  Nondescript Severity:  Moderate Onset quality:  Gradual Progression:  Worsening Chronicity:  New Smoker: no   Relieved by:  Nothing Worsened by:  Nothing Ineffective treatments:  None tried Associated symptoms: sore throat   Associated symptoms: no fever and no shortness of breath   Risk factors: no recent infection     Past Medical History:  Diagnosis Date  . ADD (attention deficit disorder)   . Anemia   . Anxiety   . Arthritis   . Asthma   . Blood in stool   . Chickenpox   . Complex regional pain syndrome of right upper extremity   . Depression   . Frequent headaches    migraines  . Heart murmur   . Hypertension   . Hypothyroidism   . IBS (irritable bowel syndrome)   . Insulin resistance   . Lumbar radiculopathy   . Mononucleosis   . PONV (postoperative nausea and vomiting)   . Seasonal allergies   . Shingles   . Spinal stenosis, lumbar   . UTI (urinary tract infection)   . Wears glasses     Patient Active Problem List   Diagnosis Date Noted  . Spinal stenosis 11/21/2018  . Preoperative clearance 11/09/2018  . Preventative health care 12/21/2017  . Rash and nonspecific skin eruption 12/21/2017  . PCOS (polycystic ovarian syndrome) 11/17/2017  . Complex regional pain syndrome type 1 11/17/2017  . Hypothyroidism 11/17/2017  . ADD (attention deficit disorder) 11/17/2017  . Essential hypertension 11/17/2017  . Depression 02/25/2014  . Chronic pain syndrome 06/06/2012  . Asthma 06/21/2011  . Post-traumatic stress disorder 06/21/2011    Past Surgical History:    Procedure Laterality Date  . ABDOMINAL HYSTERECTOMY  2009  . ANTERIOR AND POSTERIOR SPINAL FUSION    . AUGMENTATION MAMMAPLASTY Bilateral    lift done at time of augmentation  . BREAST ENHANCEMENT SURGERY  2008  . CARPAL TUNNEL RELEASE Right 2009  . [place], 2006  . cold cone colonization    . LEEP  2000  . LUMBAR LAMINECTOMY/DECOMPRESSION MICRODISCECTOMY Right 11/21/2018   Procedure: RIGHT LUMBAR THREE THROUGH FOUR, LUMBAR FOUR THROUGH FIVE DECOMPRESSION;  Surgeon: Melina Schools, MD;  Location: [place];  Service: Orthopedics;  Laterality: Right;  150 mins  . SPINAL CORD STIMULATOR INSERTION  2017  . TUBAL LIGATION    . UTERINE FIBROID SURGERY  2005    OB History   No obstetric history on file.      Home Medications    Prior to Admission medications   Medication Sig Start Date End Date Taking? Authorizing Provider  albuterol (PROVENTIL HFA;VENTOLIN HFA) 108 (90 Base) MCG/ACT inhaler INHALE 2 PUFFS INTO THE LUNGS EVERY 6 HOURS AS NEEDED FOR WHEEZING OR SHORTNESS OF BREATH Patient taking differently: Inhale 2 puffs into the lungs every 6 (six) hours as needed for wheezing or shortness of breath.  01/26/18   Pleas Koch, NP  amphetamine-dextroamphetamine (ADDERALL XR) 20 MG 24 hr capsule Take 20 mg by mouth daily.  03/29/16   [provider]  amphetamine-dextroamphetamine (ADDERALL) 20 MG tablet Take 20 mg by mouth See admin instructions. Take 1 tablet daily and 1 tablet at 1300.    [provider]  buPROPion (WELLBUTRIN XL) 150 MG 24 hr tablet Take 150 mg by mouth See admin instructions. Take 1 tablet in the morning and 1 tablet at 1300    [provider]  buPROPion (WELLBUTRIN XL) 300 MG 24 hr tablet Take 300 mg by mouth daily. NAME BRAND ONLY    [provider]  cetirizine (ZYRTEC) 10 MG tablet Take 10 mg by mouth at bedtime.    [provider]  doxepin (SINEQUAN) 50 MG capsule Take 150 mg by mouth at bedtime.   11/11/17   [provider]  hydrochlorothiazide (HYDRODIURIL) 50 MG tablet TAKE ONE TABLET BY MOUTH EVERY DAY 04/22/19   Pleas Koch, NP  levothyroxine (SYNTHROID) 75 MCG tablet TAKE 1 TABLET EVERY DAY ON EMPTY STOMACHWITH A GLASS OF WATER AT LEAST 30-60 [place] BREAKFAST Patient taking differently: Take 75 mcg by mouth daily before breakfast.  10/19/18   Pleas Koch, NP  Liraglutide (VICTOZA [place]) Inject 1.8 mLs into the skin every evening.     [provider]  metFORMIN (GLUCOPHAGE) 500 MG tablet Take 500 mg by mouth See admin instructions. Take 1 tablet daily and 1 tablet at 1300.    [provider]  ondansetron (ZOFRAN) 4 MG tablet Take 1 tablet (4 mg total) by mouth every 8 (eight) hours as needed for nausea or vomiting. 11/21/18   Melina Schools, MD  orlistat (ALLI) 60 MG capsule Take 60-120 mg by mouth 3 (three) times daily with meals. Do not exceed 3 capsules daily.    [provider]  temazepam (RESTORIL) 15 MG capsule Take 30 mg by mouth at bedtime as needed for sleep.    [provider]  tiaGABine (GABITRIL) 4 MG tablet Take 16 mg by mouth at bedtime.    [provider]  venlafaxine XR (EFFEXOR-XR) 150 MG 24 hr capsule Take 300 mg by mouth daily with breakfast.  11/16/17   [provider]    Family History Family History  Problem Relation Age of Onset  . Depression Mother   . Learning disabilities Mother   . Depression Father   . Early death Father   . Depression Sister   . Drug abuse Sister   . Early death Sister   . Mental illness Sister   . Miscarriages / Stillbirths Sister   . Multiple sclerosis Sister   . Alcohol abuse Daughter   . Depression Daughter   . Diabetes Daughter   . Learning disabilities Daughter   . Depression Son   . Learning disabilities Son   . Cancer Maternal Grandmother   . Hearing loss Maternal Grandmother   . Breast cancer Maternal Grandmother   . Alcohol abuse Paternal  Grandmother   . Heart disease Paternal Grandmother   . Hyperlipidemia Paternal Grandmother   . Hypertension Paternal Grandmother   . Kidney disease Paternal Grandmother   . ADD / ADHD Paternal Grandfather   . COPD Paternal Grandfather   . Kidney disease Paternal Grandfather   . Hypertension Paternal Grandfather   . Hyperlipidemia Paternal Grandfather   . Depression Son     Social History Social History   Tobacco Use  . Smoking status: Former Smoker    Types: Cigarettes  . Smokeless tobacco: Never Used  . Tobacco comment: quit smoking cirarettes at age 27  Substance  Use Topics  . Alcohol use: Yes    Comment: occasional  . Drug use: Never     Allergies   Hydrocodone-acetaminophen, Oxycodone-acetaminophen, and Pertussis vaccines   Review of Systems Review of Systems  Constitutional: Negative for fever.  HENT: Positive for sore throat.   Respiratory: Positive for cough. Negative for shortness of breath.   All other systems reviewed and are negative.    Physical Exam Triage Vital Signs ED Triage Vitals [04/23/19 1944]  Enc Vitals Group     BP 137/85     Pulse Rate 87     Resp 18     Temp 98.4 F (36.9 C)     Temp Source Oral     SpO2 95 %     Weight      Height      Head Circumference      Peak Flow      Pain Score 6     Pain Loc      Pain Edu?      Excl. in [place]?    No data found.  Updated Vital Signs BP 137/85 (BP Location: Right Arm)   Pulse 87   Temp 98.4 F (36.9 C) (Oral)   Resp 18   LMP  (LMP Unknown)   SpO2 95%   Visual Acuity Right Eye Distance:   Left Eye Distance:   Bilateral Distance:    Right Eye Near:   Left Eye Near:    Bilateral Near:     Physical Exam Vitals and nursing note reviewed.  Constitutional:      Appearance: She is well-developed.  HENT:     Head: Normocephalic.     Mouth/Throat:     Mouth: Mucous membranes are moist.  Cardiovascular:     Rate and Rhythm: Normal rate.  Pulmonary:     Effort: Pulmonary  effort is normal.  Abdominal:     General: There is no distension.  Musculoskeletal:        General: Normal range of motion.     Cervical back: Normal range of motion.  Skin:    General: Skin is warm.  Neurological:     General: No focal deficit present.     Mental Status: She is alert and oriented to person, place, and time.  Psychiatric:        Mood and Affect: Mood normal.      UC Treatments / Results  Labs (all labs ordered are listed, but only abnormal results are displayed) Labs Reviewed  NOVEL CORONAVIRUS, NAA (HOSP ORDER, SEND-OUT TO REF LAB; TAT 18-24 HRS)    EKG   Radiology No results found.  Procedures Procedures (including critical care time)  Medications Ordered in UC Medications - No data to display  Initial Impression / Assessment and Plan / UC Course  I have reviewed the triage vital signs and the nursing notes.  Pertinent labs & imaging results that were available during my care of the patient were reviewed by me and considered in my medical decision making (see chart for details).     MDM: vitals normal  Covid ordered.  Pt has a history of asthma.  Pt is out of albuterol  Pt given rx for albuterol and prednisone taper Final Clinical Impressions(s) / UC Diagnoses   Final diagnoses:  Upper respiratory tract infection, unspecified type   Discharge Instructions   None    ED Prescriptions    None    An After Visit Summary was printed and given  to the patient.  PDMP not reviewed this encounter.   Fransico Meadow, [place] 04/23/19 2006

## 2019-04-23 NOTE — Telephone Encounter (Signed)
Pt said fatigue and body aches started on 04/01/19;pt said was tested at [facility] and covid was negative. pt said continues with fatigue and body aches,pt has asthma and pt is wheezing a lot and wheezing and SOB is worse upon any exertion. Pt has asthma and inhaler is not helping.pt has N&V, the vomiting has stopped but pt continues with watery  diarrhea since 04/20/19.pt has fever of 99.9, chills, head is pounding, generalized weakness, S/T, dry cough, dry mouth and dizziness. Pt declined 911; pt said her son would take her to [facility] ED. Pt sounds very congested in chest. FYI to Gentry Fitz NP.

## 2019-04-24 NOTE — Telephone Encounter (Signed)
Sounds like she needs chest xray and maybe even a nebulized treatment. Agree.

## 2019-04-25 DIAGNOSIS — F3341 Major depressive disorder, recurrent, in partial remission: Secondary | ICD-10-CM | POA: Diagnosis not present

## 2019-04-25 DIAGNOSIS — F4312 Post-traumatic stress disorder, chronic: Secondary | ICD-10-CM | POA: Diagnosis not present

## 2019-04-25 DIAGNOSIS — F411 Generalized anxiety disorder: Secondary | ICD-10-CM | POA: Diagnosis not present

## 2019-04-25 LAB — NOVEL CORONAVIRUS, NAA (HOSP ORDER, SEND-OUT TO REF LAB; TAT 18-24 HRS): SARS-CoV-2, NAA: NOT DETECTED

## 2019-05-03 DIAGNOSIS — F411 Generalized anxiety disorder: Secondary | ICD-10-CM | POA: Diagnosis not present

## 2019-05-03 DIAGNOSIS — F4312 Post-traumatic stress disorder, chronic: Secondary | ICD-10-CM | POA: Diagnosis not present

## 2019-05-03 DIAGNOSIS — F3341 Major depressive disorder, recurrent, in partial remission: Secondary | ICD-10-CM | POA: Diagnosis not present

## 2019-05-13 DIAGNOSIS — G90519 Complex regional pain syndrome I of unspecified upper limb: Secondary | ICD-10-CM | POA: Diagnosis not present

## 2019-05-13 DIAGNOSIS — M5416 Radiculopathy, lumbar region: Secondary | ICD-10-CM | POA: Diagnosis not present

## 2019-05-13 DIAGNOSIS — M791 Myalgia, unspecified site: Secondary | ICD-10-CM | POA: Diagnosis not present

## 2019-05-16 DIAGNOSIS — F4312 Post-traumatic stress disorder, chronic: Secondary | ICD-10-CM | POA: Diagnosis not present

## 2019-05-16 DIAGNOSIS — F411 Generalized anxiety disorder: Secondary | ICD-10-CM | POA: Diagnosis not present

## 2019-05-16 DIAGNOSIS — F3341 Major depressive disorder, recurrent, in partial remission: Secondary | ICD-10-CM | POA: Diagnosis not present

## 2019-05-17 DIAGNOSIS — M5137 Other intervertebral disc degeneration, lumbosacral region: Secondary | ICD-10-CM | POA: Diagnosis not present

## 2019-05-17 DIAGNOSIS — M5416 Radiculopathy, lumbar region: Secondary | ICD-10-CM | POA: Diagnosis not present

## 2019-05-17 DIAGNOSIS — M5117 Intervertebral disc disorders with radiculopathy, lumbosacral region: Secondary | ICD-10-CM | POA: Diagnosis not present

## 2019-05-23 DIAGNOSIS — F411 Generalized anxiety disorder: Secondary | ICD-10-CM | POA: Diagnosis not present

## 2019-05-23 DIAGNOSIS — F3341 Major depressive disorder, recurrent, in partial remission: Secondary | ICD-10-CM | POA: Diagnosis not present

## 2019-05-23 DIAGNOSIS — F900 Attention-deficit hyperactivity disorder, predominantly inattentive type: Secondary | ICD-10-CM | POA: Diagnosis not present

## 2019-05-23 DIAGNOSIS — F4312 Post-traumatic stress disorder, chronic: Secondary | ICD-10-CM | POA: Diagnosis not present

## 2019-05-31 DIAGNOSIS — M5416 Radiculopathy, lumbar region: Secondary | ICD-10-CM | POA: Diagnosis not present

## 2019-06-03 DIAGNOSIS — F4312 Post-traumatic stress disorder, chronic: Secondary | ICD-10-CM | POA: Diagnosis not present

## 2019-06-03 DIAGNOSIS — F411 Generalized anxiety disorder: Secondary | ICD-10-CM | POA: Diagnosis not present

## 2019-06-03 DIAGNOSIS — F900 Attention-deficit hyperactivity disorder, predominantly inattentive type: Secondary | ICD-10-CM | POA: Diagnosis not present

## 2019-06-03 DIAGNOSIS — F3341 Major depressive disorder, recurrent, in partial remission: Secondary | ICD-10-CM | POA: Diagnosis not present

## 2019-06-05 DIAGNOSIS — M545 Low back pain: Secondary | ICD-10-CM | POA: Diagnosis not present

## 2019-06-05 DIAGNOSIS — Z9089 Acquired absence of other organs: Secondary | ICD-10-CM | POA: Diagnosis not present

## 2019-06-12 DIAGNOSIS — M5416 Radiculopathy, lumbar region: Secondary | ICD-10-CM | POA: Diagnosis not present

## 2019-06-18 DIAGNOSIS — F4312 Post-traumatic stress disorder, chronic: Secondary | ICD-10-CM | POA: Diagnosis not present

## 2019-06-18 DIAGNOSIS — F900 Attention-deficit hyperactivity disorder, predominantly inattentive type: Secondary | ICD-10-CM | POA: Diagnosis not present

## 2019-06-18 DIAGNOSIS — F411 Generalized anxiety disorder: Secondary | ICD-10-CM | POA: Diagnosis not present

## 2019-06-18 DIAGNOSIS — F3341 Major depressive disorder, recurrent, in partial remission: Secondary | ICD-10-CM | POA: Diagnosis not present

## 2019-06-21 ENCOUNTER — Telehealth: Payer: Self-pay

## 2019-06-21 ENCOUNTER — Ambulatory Visit (INDEPENDENT_AMBULATORY_CARE_PROVIDER_SITE_OTHER): Payer: PPO | Admitting: Family Medicine

## 2019-06-21 ENCOUNTER — Encounter: Payer: Self-pay | Admitting: Family Medicine

## 2019-06-21 ENCOUNTER — Other Ambulatory Visit: Payer: PPO

## 2019-06-21 VITALS — BP 105/74 | HR 75 | Temp 98.7°F | Ht 61.0 in | Wt 175.0 lb

## 2019-06-21 DIAGNOSIS — R300 Dysuria: Secondary | ICD-10-CM

## 2019-06-21 LAB — POC URINALSYSI DIPSTICK (AUTOMATED)
Blood, UA: NEGATIVE
Glucose, UA: NEGATIVE
Ketones, UA: NEGATIVE
Leukocytes, UA: NEGATIVE
Nitrite, UA: POSITIVE
Protein, UA: POSITIVE — AB
Spec Grav, UA: 1.025 (ref 1.010–1.025)
Urobilinogen, UA: 1 E.U./dL
pH, UA: 6 (ref 5.0–8.0)

## 2019-06-21 MED ORDER — CEPHALEXIN 500 MG PO CAPS: 500.0000 mg | ORAL_CAPSULE | Freq: Two times a day (BID) | ORAL | 0 refills | Status: DC

## 2019-06-21 NOTE — Progress Notes (Signed)
Interactive audio and video telecommunications were attempted between this provider and patient, however failed, due to patient having technical difficulties OR patient did not have access to video capability.  We continued and completed visit with audio only.   Virtual Visit via Telephone Note  I connected with patient on 06/21/19  at 2:49 PM  by telephone and verified that I am speaking with the correct person using two identifiers.  Location of patient: home  Location of MD: [place] Name of referring provider (if blank then none associated): Names per persons and role in encounter:  MD: Earlyne Iba, Patient: name listed above.    I discussed the limitations, risks, security and privacy concerns of performing an evaluation and management service by telephone and the availability of in person appointments. I also discussed with the patient that there may be a patient responsible charge related to this service. The patient expressed understanding and agreed to proceed.  CC: dysuria   History of Present Illness:   Dysuria: yes duration of symptoms: a few weeks per patient report.  abdominal pain: no new pain but she has chronic pain at baseline.  Fevers: no back pain: see above.  Vomiting: no U/a d/w pt.    Observations/Objective: nad Speech wnl.   Assessment and Plan: Presumed cystitis.  Still okay for outpatient follow-up.  Check ucx and start keflex.  She agrees.  Routine cautions d/w pt.   Follow Up Instructions: See above.   I discussed the assessment and treatment plan with the patient. The patient was provided an opportunity to ask questions and all were answered. The patient agreed with the plan and demonstrated an understanding of the instructions.   The patient was advised to call back or seek an in-person evaluation if the symptoms worsen or if the condition fails to improve as anticipated.  I provided 12 minutes of non-face-to-face time during this  encounter.  Elsie Stain, MD

## 2019-06-21 NOTE — Telephone Encounter (Signed)
Pt said due to her immune system pt does not want to come to office; for few days pt has burning upon urination; tea colored urine and urine has bad odor. pts son who has no covid symptoms will bring urine specimen to [facility] front foyer around 12:30-12:45(pts son will call to give notice urine is at front foyer). Pt has virtual appt with Dr Damita Dunnings today  at 2:15. UC & ED precautions given and pt voiced understanding. Pt will have vitals ready when CMA calls. FYI to Dr Damita Dunnings and Terri Skains CMA.

## 2019-06-21 NOTE — Telephone Encounter (Signed)
Noted. Thanks.

## 2019-06-23 ENCOUNTER — Other Ambulatory Visit: Payer: Self-pay | Admitting: Family Medicine

## 2019-06-23 DIAGNOSIS — R300 Dysuria: Secondary | ICD-10-CM | POA: Insufficient documentation

## 2019-06-23 HISTORY — DX: Dysuria: R30.0

## 2019-06-23 LAB — URINE CULTURE
MICRO NUMBER:: 10346427
SPECIMEN QUALITY:: ADEQUATE

## 2019-06-23 MED ORDER — SULFAMETHOXAZOLE-TRIMETHOPRIM 800-160 MG PO TABS: 1.0000 | ORAL_TABLET | Freq: Two times a day (BID) | ORAL | 0 refills | Status: DC

## 2019-06-23 NOTE — Assessment & Plan Note (Signed)
Presumed cystitis.  Still okay for outpatient follow-up.  Check ucx and start keflex.  She agrees.  Routine cautions d/w pt.

## 2019-06-26 ENCOUNTER — Telehealth: Payer: Self-pay

## 2019-06-26 NOTE — Telephone Encounter (Signed)
lvm to confirm with patient that this has been cancelled.

## 2019-06-26 NOTE — Telephone Encounter (Signed)
[place] Night - Client Nonclinical Telephone Record AccessNurse Client [facility] Night - Client Client Site [place] Physician Alma Friendly - NP Contact Type Call Who Is Calling Patient / Member / Family / Caregiver Caller Name [place] Phone Number [phone] Patient Name Tanya [name] Patient DOB [birth year] Call Type Message Only Information Provided Reason for Call Request for General Office Information Initial Comment caller needs to cancel her appt scheduled for this week Thursday at 10:05, caller is getting her second vaccine shot at that same Additional Comment Disp. Time Disposition Final User 06/25/2019 5:12:48 PM General Information Provided Yes Kenton Kingfisher, [place] Call Closed By: Nelia Shi Transaction Date/Time: 06/25/2019 5:10:33 PM (ET)

## 2019-06-27 ENCOUNTER — Encounter: Payer: PPO | Admitting: Primary Care

## 2019-07-04 DIAGNOSIS — F4312 Post-traumatic stress disorder, chronic: Secondary | ICD-10-CM | POA: Diagnosis not present

## 2019-07-04 DIAGNOSIS — F411 Generalized anxiety disorder: Secondary | ICD-10-CM | POA: Diagnosis not present

## 2019-07-04 DIAGNOSIS — F4323 Adjustment disorder with mixed anxiety and depressed mood: Secondary | ICD-10-CM | POA: Diagnosis not present

## 2019-07-05 ENCOUNTER — Other Ambulatory Visit: Payer: Self-pay | Admitting: Dermatology

## 2019-07-05 DIAGNOSIS — M47812 Spondylosis without myelopathy or radiculopathy, cervical region: Secondary | ICD-10-CM | POA: Diagnosis not present

## 2019-07-05 DIAGNOSIS — M5127 Other intervertebral disc displacement, lumbosacral region: Secondary | ICD-10-CM | POA: Diagnosis not present

## 2019-07-05 DIAGNOSIS — M5136 Other intervertebral disc degeneration, lumbar region: Secondary | ICD-10-CM | POA: Diagnosis not present

## 2019-07-05 DIAGNOSIS — Z9889 Other specified postprocedural states: Secondary | ICD-10-CM | POA: Diagnosis not present

## 2019-07-05 DIAGNOSIS — M5126 Other intervertebral disc displacement, lumbar region: Secondary | ICD-10-CM | POA: Diagnosis not present

## 2019-07-10 DIAGNOSIS — M5416 Radiculopathy, lumbar region: Secondary | ICD-10-CM | POA: Diagnosis not present

## 2019-07-11 DIAGNOSIS — Z9889 Other specified postprocedural states: Secondary | ICD-10-CM | POA: Diagnosis not present

## 2019-07-18 DIAGNOSIS — Z5181 Encounter for therapeutic drug level monitoring: Secondary | ICD-10-CM | POA: Diagnosis not present

## 2019-07-18 DIAGNOSIS — Z79899 Other long term (current) drug therapy: Secondary | ICD-10-CM | POA: Diagnosis not present

## 2019-07-18 DIAGNOSIS — M5417 Radiculopathy, lumbosacral region: Secondary | ICD-10-CM | POA: Diagnosis not present

## 2019-07-18 DIAGNOSIS — Z9689 Presence of other specified functional implants: Secondary | ICD-10-CM | POA: Diagnosis not present

## 2019-07-18 DIAGNOSIS — G90511 Complex regional pain syndrome I of right upper limb: Secondary | ICD-10-CM | POA: Diagnosis not present

## 2019-07-18 DIAGNOSIS — G894 Chronic pain syndrome: Secondary | ICD-10-CM | POA: Diagnosis not present

## 2019-07-19 ENCOUNTER — Other Ambulatory Visit: Payer: Self-pay | Admitting: Primary Care

## 2019-07-19 DIAGNOSIS — E039 Hypothyroidism, unspecified: Secondary | ICD-10-CM

## 2019-07-22 NOTE — Telephone Encounter (Signed)
Ok to refill? Last prescribed on 10/19/2018 . Last  TSH was scan in on 09/21/2018. Last OV on 06/21/2019 with Dr Damita Dunnings. No future OV.  Patient canceled her CPE that suppose to be on 06/27/2019

## 2019-07-23 DIAGNOSIS — M25552 Pain in left hip: Secondary | ICD-10-CM | POA: Diagnosis not present

## 2019-07-23 DIAGNOSIS — M25551 Pain in right hip: Secondary | ICD-10-CM | POA: Diagnosis not present

## 2019-07-24 ENCOUNTER — Other Ambulatory Visit: Payer: Self-pay | Admitting: Orthopedic Surgery

## 2019-07-24 DIAGNOSIS — Z4889 Encounter for other specified surgical aftercare: Secondary | ICD-10-CM

## 2019-08-14 DIAGNOSIS — F4323 Adjustment disorder with mixed anxiety and depressed mood: Secondary | ICD-10-CM | POA: Diagnosis not present

## 2019-08-14 DIAGNOSIS — F411 Generalized anxiety disorder: Secondary | ICD-10-CM | POA: Diagnosis not present

## 2019-08-14 DIAGNOSIS — F4312 Post-traumatic stress disorder, chronic: Secondary | ICD-10-CM | POA: Diagnosis not present

## 2019-08-15 DIAGNOSIS — Z9689 Presence of other specified functional implants: Secondary | ICD-10-CM | POA: Diagnosis not present

## 2019-08-15 DIAGNOSIS — G894 Chronic pain syndrome: Secondary | ICD-10-CM | POA: Diagnosis not present

## 2019-08-15 DIAGNOSIS — M25551 Pain in right hip: Secondary | ICD-10-CM | POA: Diagnosis not present

## 2019-08-15 DIAGNOSIS — G90519 Complex regional pain syndrome I of unspecified upper limb: Secondary | ICD-10-CM | POA: Diagnosis not present

## 2019-08-30 DIAGNOSIS — G8929 Other chronic pain: Secondary | ICD-10-CM | POA: Diagnosis not present

## 2019-08-30 DIAGNOSIS — M25552 Pain in left hip: Secondary | ICD-10-CM | POA: Diagnosis not present

## 2019-08-30 DIAGNOSIS — M25551 Pain in right hip: Secondary | ICD-10-CM | POA: Diagnosis not present

## 2019-09-04 DIAGNOSIS — F4323 Adjustment disorder with mixed anxiety and depressed mood: Secondary | ICD-10-CM | POA: Diagnosis not present

## 2019-09-04 DIAGNOSIS — F4312 Post-traumatic stress disorder, chronic: Secondary | ICD-10-CM | POA: Diagnosis not present

## 2019-09-04 DIAGNOSIS — F411 Generalized anxiety disorder: Secondary | ICD-10-CM | POA: Diagnosis not present

## 2019-09-12 DIAGNOSIS — M545 Low back pain: Secondary | ICD-10-CM | POA: Diagnosis not present

## 2019-09-12 DIAGNOSIS — M9904 Segmental and somatic dysfunction of sacral region: Secondary | ICD-10-CM | POA: Diagnosis not present

## 2019-09-12 DIAGNOSIS — G894 Chronic pain syndrome: Secondary | ICD-10-CM | POA: Diagnosis not present

## 2019-09-12 DIAGNOSIS — M5416 Radiculopathy, lumbar region: Secondary | ICD-10-CM | POA: Diagnosis not present

## 2019-09-12 DIAGNOSIS — M48061 Spinal stenosis, lumbar region without neurogenic claudication: Secondary | ICD-10-CM | POA: Diagnosis not present

## 2019-09-23 ENCOUNTER — Telehealth: Payer: Self-pay

## 2019-09-23 ENCOUNTER — Encounter (HOSPITAL_COMMUNITY): Payer: Self-pay

## 2019-09-23 ENCOUNTER — Ambulatory Visit
Admission: RE | Admit: 2019-09-23 | Discharge: 2019-09-23 | Disposition: A | Discharge status: Home / Self Care | Payer: PPO | Source: Ambulatory Visit

## 2019-09-23 ENCOUNTER — Emergency Department (HOSPITAL_COMMUNITY)
Admission: EM | Admit: 2019-09-23 | Discharge: 2019-09-24 | Disposition: A | Discharge status: Home / Self Care | Payer: PPO | Attending: Emergency Medicine | Admitting: Emergency Medicine

## 2019-09-23 ENCOUNTER — Other Ambulatory Visit: Payer: Self-pay

## 2019-09-23 VITALS — BP 125/73 | HR 104 | Temp 99.0°F | Resp 18

## 2019-09-23 DIAGNOSIS — Z87891 Personal history of nicotine dependence: Secondary | ICD-10-CM | POA: Insufficient documentation

## 2019-09-23 DIAGNOSIS — G43001 Migraine without aura, not intractable, with status migrainosus: Secondary | ICD-10-CM | POA: Insufficient documentation

## 2019-09-23 DIAGNOSIS — Z7984 Long term (current) use of oral hypoglycemic drugs: Secondary | ICD-10-CM | POA: Insufficient documentation

## 2019-09-23 DIAGNOSIS — R110 Nausea: Secondary | ICD-10-CM | POA: Diagnosis not present

## 2019-09-23 DIAGNOSIS — E039 Hypothyroidism, unspecified: Secondary | ICD-10-CM | POA: Diagnosis not present

## 2019-09-23 DIAGNOSIS — R519 Headache, unspecified: Secondary | ICD-10-CM

## 2019-09-23 DIAGNOSIS — Z79899 Other long term (current) drug therapy: Secondary | ICD-10-CM | POA: Diagnosis not present

## 2019-09-23 DIAGNOSIS — H53149 Visual discomfort, unspecified: Secondary | ICD-10-CM | POA: Diagnosis not present

## 2019-09-23 DIAGNOSIS — I10 Essential (primary) hypertension: Secondary | ICD-10-CM | POA: Diagnosis not present

## 2019-09-23 MED ORDER — PROCHLORPERAZINE EDISYLATE 10 MG/2ML IJ SOLN
10.0000 mg | Freq: Once | INTRAMUSCULAR | Status: AC
  Administered 2019-09-24: 10 mg via INTRAVENOUS
  Filled 2019-09-23: qty 2

## 2019-09-23 MED ORDER — SODIUM CHLORIDE 0.9 % IV BOLUS
1000.0000 mL | Freq: Once | INTRAVENOUS | Status: AC
  Administered 2019-09-24: 1000 mL via INTRAVENOUS

## 2019-09-23 MED ORDER — DIPHENHYDRAMINE HCL 50 MG/ML IJ SOLN
25.0000 mg | Freq: Once | INTRAMUSCULAR | Status: AC
  Administered 2019-09-24: 25 mg via INTRAVENOUS
  Filled 2019-09-23: qty 1

## 2019-09-23 MED ORDER — DEXAMETHASONE SODIUM PHOSPHATE 10 MG/ML IJ SOLN
10.0000 mg | Freq: Once | INTRAMUSCULAR | Status: AC
  Administered 2019-09-23: 10 mg via INTRAMUSCULAR

## 2019-09-23 MED ORDER — DIPHENHYDRAMINE HCL 50 MG/ML IJ SOLN
25.0000 mg | Freq: Once | INTRAMUSCULAR | Status: AC
  Administered 2019-09-23: 25 mg via INTRAMUSCULAR

## 2019-09-23 MED ORDER — KETOROLAC TROMETHAMINE 60 MG/2ML IM SOLN
60.0000 mg | Freq: Once | INTRAMUSCULAR | Status: AC
  Administered 2019-09-23: 60 mg via INTRAMUSCULAR

## 2019-09-23 MED ORDER — METOCLOPRAMIDE HCL 5 MG/ML IJ SOLN
10.0000 mg | Freq: Once | INTRAMUSCULAR | Status: AC
  Administered 2019-09-23: 10 mg via INTRAMUSCULAR

## 2019-09-23 NOTE — ED Notes (Signed)
No answer for vitals recheck x1 

## 2019-09-23 NOTE — Telephone Encounter (Signed)
Unable to reach pt by phone and spoke with Mr Mcchesney (DPR signed) Mr Hartmann said pt called on 09/22/19 for appt. Mr Gulledge will have pt call [facility] for an appt. FYI to PCP and [facility] CMA.

## 2019-09-23 NOTE — ED Notes (Signed)
NO ANSWER X2 FOR VITALS RECHECK

## 2019-09-23 NOTE — ED Provider Notes (Signed)
[facility] EMERGENCY DEPARTMENT Provider Note   CSN: [id number] Arrival date & time: 09/23/19  1407   History Chief Complaint  Patient presents with  . Headache    Tanya [name] is a 49 y.o. female.  The history is provided by the patient.  Headache She has history of hypertension, diabetes, chronic pain, migraines and comes in because of a migraine for the last week.  Pain is mainly left retro-orbital and throbbing.  There is associated photophobia and nausea.  She has tried a variety of measures including ibuprofen, naproxen, variety of physical measures and attempted hydration without relief.  Last night, she took a dose of hydromorphone 4 mg without relief.  She has had similar headaches in the past, but this is the first time she has had to come to the hospital for treatment.  She did go to urgent care today where she was given a migraine cocktail of metoclopramide, ketorolac, dexamethasone which gave her no relief.  She denies fever or chills.  She denies any weakness.  On one occasion, she thought she had some drooping of her left eye, but that is resolved.  Past Medical History:  Diagnosis Date  . ADD (attention deficit disorder)   . Anemia   . Anxiety   . Arthritis   . Asthma   . Blood in stool   . Chickenpox   . Complex regional pain syndrome of right upper extremity   . Depression   . Frequent headaches    migraines  . Heart murmur   . Hypertension   . Hypothyroidism   . IBS (irritable bowel syndrome)   . Insulin resistance   . Lumbar radiculopathy   . Mononucleosis   . PONV (postoperative nausea and vomiting)   . Seasonal allergies   . Shingles   . Spinal stenosis, lumbar   . UTI (urinary tract infection)   . Wears glasses     Patient Active Problem List   Diagnosis Date Noted  . Dysuria 06/23/2019  . Spinal stenosis 11/21/2018  . Preoperative clearance 11/09/2018  . Preventative health care 12/21/2017  . Rash and nonspecific skin  eruption 12/21/2017  . PCOS (polycystic ovarian syndrome) 11/17/2017  . Complex regional pain syndrome type 1 11/17/2017  . Hypothyroidism 11/17/2017  . ADD (attention deficit disorder) 11/17/2017  . Essential hypertension 11/17/2017  . Depression 02/25/2014  . Chronic pain syndrome 06/06/2012  . Asthma 06/21/2011  . Post-traumatic stress disorder 06/21/2011    Past Surgical History:  Procedure Laterality Date  . ABDOMINAL HYSTERECTOMY  2009  . ANTERIOR AND POSTERIOR SPINAL FUSION    . AUGMENTATION MAMMAPLASTY Bilateral    lift done at time of augmentation  . BREAST ENHANCEMENT SURGERY  2008  . CARPAL TUNNEL RELEASE Right 2009  . [place], 2006  . cold cone colonization    . LEEP  2000  . LUMBAR LAMINECTOMY/DECOMPRESSION MICRODISCECTOMY Right 11/21/2018   Procedure: RIGHT LUMBAR THREE THROUGH FOUR, LUMBAR FOUR THROUGH FIVE DECOMPRESSION;  Surgeon: Melina Schools, MD;  Location: [facility];  Service: Orthopedics;  Laterality: Right;  150 mins  . SPINAL CORD STIMULATOR INSERTION  2017  . TUBAL LIGATION    . UTERINE FIBROID SURGERY  2005     OB History   No obstetric history on file.     Family History  Problem Relation Age of Onset  . Depression Mother   . Learning disabilities Mother   . Depression Father   . Early death  Father   . Depression Sister   . Drug abuse Sister   . Early death Sister   . Mental illness Sister   . Miscarriages / Stillbirths Sister   . Multiple sclerosis Sister   . Alcohol abuse Daughter   . Depression Daughter   . Diabetes Daughter   . Learning disabilities Daughter   . Depression Son   . Learning disabilities Son   . Cancer Maternal Grandmother   . Hearing loss Maternal Grandmother   . Breast cancer Maternal Grandmother   . Alcohol abuse Paternal Grandmother   . Heart disease Paternal Grandmother   . Hyperlipidemia Paternal Grandmother   . Hypertension Paternal Grandmother   . Kidney disease Paternal Grandmother   .  ADD / ADHD Paternal Grandfather   . COPD Paternal Grandfather   . Kidney disease Paternal Grandfather   . Hypertension Paternal Grandfather   . Hyperlipidemia Paternal Grandfather   . Depression Son     Social History   Tobacco Use  . Smoking status: Former Smoker    Types: Cigarettes  . Smokeless tobacco: Never Used  . Tobacco comment: quit smoking cirarettes at age 89  Vaping Use  . Vaping Use: Never used  Substance Use Topics  . Alcohol use: Yes    Comment: occasional  . Drug use: Never    Home Medications Prior to Admission medications   Medication Sig Start Date End Date Taking? Authorizing Provider  albuterol (VENTOLIN HFA) 108 (90 Base) MCG/ACT inhaler Inhale 2 puffs into the lungs every 6 (six) hours as needed for wheezing or shortness of breath. 04/23/19   Fransico Meadow, PA-C  ALPRAZolam (XANAX XR) 1 MG 24 hr tablet Take 1 mg by mouth in the morning, at noon, and at bedtime.    [provider]  amphetamine-dextroamphetamine (ADDERALL XR) 20 MG 24 hr capsule Take 20 mg by mouth daily.  03/29/16   [provider]  amphetamine-dextroamphetamine (ADDERALL) 20 MG tablet Take 20 mg by mouth See admin instructions. Take 1 tablet daily and 1 tablet at 1300.    [provider]  buPROPion (WELLBUTRIN XL) 150 MG 24 hr tablet Take 150 mg by mouth See admin instructions. Take 1 tablet in the morning and 1 tablet at 1300    [provider]  buPROPion (WELLBUTRIN XL) 300 MG 24 hr tablet Take 300 mg by mouth daily. NAME BRAND ONLY    [provider]  cetirizine (ZYRTEC) 10 MG tablet Take 10 mg by mouth at bedtime.    [provider]  doxepin (SINEQUAN) 50 MG capsule Take 150 mg by mouth at bedtime.  11/11/17   [provider]  doxycycline (PERIOSTAT) 20 MG tablet TAKE ONE TABLET BY MOUTH TWICE DAILY WITH FOOD 07/08/19   Moye, [place], MD  estazolam (PROSOM) 2 MG tablet Take 2 mg by mouth at bedtime.    [provider]    hydrochlorothiazide (HYDRODIURIL) 50 MG tablet TAKE ONE TABLET BY MOUTH EVERY DAY 04/22/19   Pleas Koch, NP  HYDROmorphone (DILAUDID) 4 MG tablet Take 4 mg by mouth every 6 (six) hours as needed for severe pain.    [provider]  levothyroxine (SYNTHROID) 75 MCG tablet TAKE 1 TABLET EVERY DAY ON EMPTY STOMACHWITH A GLASS OF WATER AT LEAST 30-60 MINBEFORE BREAKFAST 07/22/19   Lesleigh Noe, MD  Liraglutide (VICTOZA [place]) Inject 1.8 mLs into the skin every evening.     [provider]  metFORMIN (GLUCOPHAGE) 500 MG tablet  Take 500 mg by mouth See admin instructions. Take 1 tablet daily and 1 tablet at 1300.    [provider]  methocarbamol (ROBAXIN) 500 MG tablet Take 500 mg by mouth every 8 (eight) hours as needed for muscle spasms.    [provider]  ondansetron (ZOFRAN) 4 MG tablet Take 1 tablet (4 mg total) by mouth every 8 (eight) hours as needed for nausea or vomiting. 11/21/18   Melina Schools, MD  orlistat (ALLI) 60 MG capsule Take 60-120 mg by mouth 3 (three) times daily with meals. Do not exceed 3 capsules daily.    [provider]  sulfamethoxazole-trimethoprim (BACTRIM DS) 800-160 MG tablet Take 1 tablet by mouth 2 (two) times daily. 06/23/19   Tonia Ghent, MD  tiaGABine (GABITRIL) 4 MG tablet Take 16 mg by mouth at bedtime.    [provider]  venlafaxine XR (EFFEXOR-XR) 150 MG 24 hr capsule Take 300 mg by mouth daily with breakfast.  11/16/17   [provider]    Allergies    Hydrocodone-acetaminophen, Oxycodone-acetaminophen, and Pertussis vaccines  Review of Systems   Review of Systems  Neurological: Positive for headaches.  All other systems reviewed and are negative.   Physical Exam Updated Vital Signs BP 98/64 (BP Location: Left Arm)   Pulse 74   Temp 98 F (36.7 C) (Oral)   Resp 14   Ht 5' (1.524 m)   Wt 72.1 kg   LMP  (LMP Unknown)   SpO2 97%   BMI 31.05 kg/m   Physical Exam Vitals and  nursing note reviewed.   49 year old female, resting comfortably and in no acute distress. Vital signs are normal. Oxygen saturation is 97%, which is normal. Head is normocephalic and atraumatic. PERRLA, EOMI. Oropharynx is clear. Neck is nontender and supple without adenopathy or JVD. Back is nontender and there is no CVA tenderness. Lungs are clear without rales, wheezes, or rhonchi. Chest is nontender. Heart has regular rate and rhythm without murmur. Abdomen is soft, flat, nontender without masses or hepatosplenomegaly and peristalsis is normoactive. Extremities have no cyanosis or edema, full range of motion is present. Skin is warm and dry without rash. Neurologic: Mental status is normal, cranial nerves are intact, there are no motor or sensory deficits.  ED Results / Procedures / Treatments   Labs (all labs ordered are listed, but only abnormal results are displayed) Labs Reviewed  CBC WITH DIFFERENTIAL/PLATELET - Abnormal; Notable for the following components:      Result Value   WBC 11.4 (*)    Neutro Abs 10.5 (*)    Lymphs Abs 0.5 (*)    All other components within normal limits  BASIC METABOLIC PANEL - Abnormal; Notable for the following components:   Sodium 134 (*)    Potassium 3.4 (*)    Glucose, Bld 142 (*)    All other components within normal limits  MAGNESIUM    Procedures Procedures   Medications Ordered in ED Medications  sodium chloride 0.9 % bolus 1,000 mL (0 mLs Intravenous Stopped 09/24/19 0250)  prochlorperazine (COMPAZINE) injection 10 mg (10 mg Intravenous Given 09/24/19 0019)  diphenhydrAMINE (BENADRYL) injection 25 mg (25 mg Intravenous Given 09/24/19 0013)  magnesium sulfate IVPB 2 g 50 mL (0 g Intravenous Stopped 09/24/19 0501)  dihydroergotamine (DHE) injection 1 mg (1 mg Intravenous Given 09/24/19 0557)    ED Course  I have reviewed the triage vital signs and the nursing notes.  Pertinent lab results that were  available during my care of the  patient were reviewed by me and considered in my medical decision making (see chart for details).  MDM Rules/Calculators/A&P Migraine headache which has failed to respond to NSAIDs, opioids, metoclopramide.  Old records are reviewed, and she has no relevant past visits.  She will be given IV fluids, prochlorperazine, diphenhydramine.  We will also check electrolytes and magnesium levels.  1:47 AM Labs are significant for borderline low potassium and borderline low sodium.  Magnesium is in the lower end of normal range.  Unfortunately, no relief with above-noted treatment.  We will try magnesium 2 g and dihydroergotamine intravenously.  4:51 AM She has noted partial relief with magnesium, has not received dihydroergotamine yet.  Headache is now rated 4/10.  Will reevaluate following dihydroergotamine.  7:01 AM Patient feels much better after dihydroergotamine and is discharged.  She received a dose of dexamethasone yesterday, so that should provide adequate protection against rebound headache, no need for additional steroid today.  Return precautions discussed.  Final Clinical Impression(s) / ED Diagnoses Final diagnoses:  Migraine without aura and with status migrainosus, not intractable    Rx / DC Orders ED Discharge Orders    None       Delora Fuel, MD 09/81/19 [phone]

## 2019-09-23 NOTE — Telephone Encounter (Signed)
Noted  

## 2019-09-23 NOTE — Telephone Encounter (Signed)
Noted. We have an 11:40 am slot open, or I can add her on around 12:20 pm.
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
## 2019-09-23 NOTE — ED Provider Notes (Addendum)
[facility]    CSN: [id number] Arrival date & time: 09/23/19  1104     History   Chief Complaint Chief Complaint  Patient presents with  . Appointment  . Migraine    HPI Tanya [name] is a 49 y.o. female.   HPI Patient with a history of chronic pain syndrome presents today for evaluation of a headache which has been ongoing for a week. Patient endorses a history of chronic migraines however I am unable to retrieve prior treatments that resolved previous migraine attacks. Patient has taken multiple doses of naproxen, ibuprofen, potassium supplements, Excedrin migraine, still waned and at its most severe has been 10 out of 10.  She also endorses some episodes of dizziness, diarrhea, and low-grade fever.  She also endorses severe light sensitivity and persistent nausea.  She endorses hydrating well and able to tolerate food.  She is unclear of what precipitated current headache.  Patient not experiencing any distressful breathing or chest pain.  Patient did bring with her to the visit a detailed account of the progression of her current course of illness over the past week.  Information was reviewed in its entirety. Past Medical History:  Diagnosis Date  . ADD (attention deficit disorder)   . Anemia   . Anxiety   . Arthritis   . Asthma   . Blood in stool   . Chickenpox   . Complex regional pain syndrome of right upper extremity   . Depression   . Frequent headaches    migraines  . Heart murmur   . Hypertension   . Hypothyroidism   . IBS (irritable bowel syndrome)   . Insulin resistance   . Lumbar radiculopathy   . Mononucleosis   . PONV (postoperative nausea and vomiting)   . Seasonal allergies   . Shingles   . Spinal stenosis, lumbar   . UTI (urinary tract infection)   . Wears glasses     Patient Active Problem List   Diagnosis Date Noted  . Dysuria 06/23/2019  . Spinal stenosis 11/21/2018  . Preoperative clearance 11/09/2018  . Preventative health care  12/21/2017  . Rash and nonspecific skin eruption 12/21/2017  . PCOS (polycystic ovarian syndrome) 11/17/2017  . Complex regional pain syndrome type 1 11/17/2017  . Hypothyroidism 11/17/2017  . ADD (attention deficit disorder) 11/17/2017  . Essential hypertension 11/17/2017  . Depression 02/25/2014  . Chronic pain syndrome 06/06/2012  . Asthma 06/21/2011  . Post-traumatic stress disorder 06/21/2011    Past Surgical History:  Procedure Laterality Date  . ABDOMINAL HYSTERECTOMY  2009  . ANTERIOR AND POSTERIOR SPINAL FUSION    . AUGMENTATION MAMMAPLASTY Bilateral    lift done at time of augmentation  . BREAST ENHANCEMENT SURGERY  2008  . CARPAL TUNNEL RELEASE Right 2009  . [place], 2006  . cold cone colonization    . LEEP  2000  . LUMBAR LAMINECTOMY/DECOMPRESSION MICRODISCECTOMY Right 11/21/2018   Procedure: RIGHT LUMBAR THREE THROUGH FOUR, LUMBAR FOUR THROUGH FIVE DECOMPRESSION;  Surgeon: Melina Schools, MD;  Location: [facility];  Service: Orthopedics;  Laterality: Right;  150 mins  . SPINAL CORD STIMULATOR INSERTION  2017  . TUBAL LIGATION    . UTERINE FIBROID SURGERY  2005    OB History   No obstetric history on file.      Home Medications    Prior to Admission medications   Medication Sig Start Date End Date Taking? Authorizing Provider  albuterol (VENTOLIN HFA) 108 (90  Base) MCG/ACT inhaler Inhale 2 puffs into the lungs every 6 (six) hours as needed for wheezing or shortness of breath. 04/23/19  Yes Caryl Ada K, PA-C  ALPRAZolam (XANAX XR) 1 MG 24 hr tablet Take 1 mg by mouth in the morning, at noon, and at bedtime.   Yes [provider]  amphetamine-dextroamphetamine (ADDERALL XR) 20 MG 24 hr capsule Take 20 mg by mouth daily.  03/29/16  Yes [provider]  amphetamine-dextroamphetamine (ADDERALL) 20 MG tablet Take 20 mg by mouth See admin instructions. Take 1 tablet daily and 1 tablet at 1300.   Yes [provider]  buPROPion  (WELLBUTRIN XL) 150 MG 24 hr tablet Take 150 mg by mouth See admin instructions. Take 1 tablet in the morning and 1 tablet at 1300   Yes [provider]  buPROPion (WELLBUTRIN XL) 300 MG 24 hr tablet Take 300 mg by mouth daily. NAME BRAND ONLY   Yes [provider]  cetirizine (ZYRTEC) 10 MG tablet Take 10 mg by mouth at bedtime.   Yes [provider]  doxepin (SINEQUAN) 50 MG capsule Take 150 mg by mouth at bedtime.  11/11/17  Yes [provider]  doxycycline (PERIOSTAT) 20 MG tablet TAKE ONE TABLET BY MOUTH TWICE DAILY WITH FOOD 07/08/19  Yes Moye, [place], MD  estazolam (PROSOM) 2 MG tablet Take 2 mg by mouth at bedtime.   Yes [provider]  hydrochlorothiazide (HYDRODIURIL) 50 MG tablet TAKE ONE TABLET BY MOUTH EVERY DAY 04/22/19  Yes Pleas Koch, NP  HYDROmorphone (DILAUDID) 4 MG tablet Take 4 mg by mouth every 6 (six) hours as needed for severe pain.   Yes [provider]  levothyroxine (SYNTHROID) 75 MCG tablet TAKE 1 TABLET EVERY DAY ON EMPTY STOMACHWITH A GLASS OF WATER AT LEAST 30-60 MINBEFORE BREAKFAST 07/22/19  Yes Lesleigh Noe, MD  Liraglutide (VICTOZA [place]) Inject 1.8 mLs into the skin every evening.    Yes [provider]  metFORMIN (GLUCOPHAGE) 500 MG tablet Take 500 mg by mouth See admin instructions. Take 1 tablet daily and 1 tablet at 1300.   Yes [provider]  methocarbamol (ROBAXIN) 500 MG tablet Take 500 mg by mouth every 8 (eight) hours as needed for muscle spasms.   Yes [provider]  orlistat (ALLI) 60 MG capsule Take 60-120 mg by mouth 3 (three) times daily with meals. Do not exceed 3 capsules daily.   Yes [provider]  sulfamethoxazole-trimethoprim (BACTRIM DS) 800-160 MG tablet Take 1 tablet by mouth 2 (two) times daily. 06/23/19  Yes Tonia Ghent, MD  tiaGABine (GABITRIL) 4 MG tablet Take 16 mg by mouth at bedtime.   Yes [provider]  venlafaxine XR  (EFFEXOR-XR) 150 MG 24 hr capsule Take 300 mg by mouth daily with breakfast.  11/16/17  Yes [provider]  ondansetron (ZOFRAN) 4 MG tablet Take 1 tablet (4 mg total) by mouth every 8 (eight) hours as needed for nausea or vomiting. 11/21/18   Melina Schools, MD    Family History Family History  Problem Relation Age of Onset  . Depression Mother   . Learning disabilities Mother   . Depression Father   . Early death Father   . Depression Sister   . Drug abuse Sister   . Early death Sister   . Mental illness Sister   . Miscarriages / Stillbirths Sister   . Multiple sclerosis Sister   . Alcohol abuse Daughter   .  Depression Daughter   . Diabetes Daughter   . Learning disabilities Daughter   . Depression Son   . Learning disabilities Son   . Cancer Maternal Grandmother   . Hearing loss Maternal Grandmother   . Breast cancer Maternal Grandmother   . Alcohol abuse Paternal Grandmother   . Heart disease Paternal Grandmother   . Hyperlipidemia Paternal Grandmother   . Hypertension Paternal Grandmother   . Kidney disease Paternal Grandmother   . ADD / ADHD Paternal Grandfather   . COPD Paternal Grandfather   . Kidney disease Paternal Grandfather   . Hypertension Paternal Grandfather   . Hyperlipidemia Paternal Grandfather   . Depression Son     Social History Social History   Tobacco Use  . Smoking status: Former Smoker    Types: Cigarettes  . Smokeless tobacco: Never Used  . Tobacco comment: quit smoking cirarettes at age 38  Vaping Use  . Vaping Use: Never used  Substance Use Topics  . Alcohol use: Yes    Comment: occasional  . Drug use: Never     Allergies   Hydrocodone-acetaminophen, Oxycodone-acetaminophen, and Pertussis vaccines   Review of Systems Review of Systems  Pertinent negatives listed in HPI Physical Exam Triage Vital Signs ED Triage Vitals  Enc Vitals Group     BP 09/23/19 1112 125/73     Pulse Rate 09/23/19 1112 (!) 104     Resp  09/23/19 1112 18     Temp 09/23/19 1112 99 F (37.2 C)     Temp Source 09/23/19 1112 Oral     SpO2 09/23/19 1112 100 %     Weight --      Height --      Head Circumference --      Peak Flow --      Pain Score 09/23/19 1113 9     Pain Loc --      Pain Edu? --      Excl. in [place]? --    No data found.  Updated Vital Signs BP 125/73 (BP Location: Left Arm)   Pulse (!) 104   Temp 99 F (37.2 C) (Oral)   Resp 18   LMP  (LMP Unknown)   SpO2 100%   Visual Acuity Right Eye Distance:   Left Eye Distance:   Bilateral Distance:    Right Eye Near:   Left Eye Near:    Bilateral Near:     Physical Exam Constitutional:      Comments: Patient is asking and responding to questions appropriately.    Eyes:     Comments: Pupils are dilated 61mm bilaterally Reactive to light Normal ocular movement photosensitivity present limited exam   Cardiovascular:     Rate and Rhythm: Normal rate and regular rhythm.  Pulmonary:     Effort: Pulmonary effort is normal.     Breath sounds: Normal breath sounds and air entry.  Psychiatric:        Behavior: Behavior is cooperative.      UC Treatments / Results  Labs (all labs ordered are listed, but only abnormal results are displayed) Labs Reviewed - No data to display  EKG   Radiology No results found.  Procedures Procedures (including critical care time)  Medications Ordered in UC Medications  ketorolac (TORADOL) injection 60 mg (60 mg Intramuscular Given 09/23/19 1154)  metoCLOPramide (REGLAN) injection 10 mg (10 mg Intramuscular Given 09/23/19 1155)  dexamethasone (DECADRON) injection 10 mg (10 mg Intramuscular Given 09/23/19 1155)  diphenhydrAMINE (BENADRYL) injection  25 mg (25 mg Intramuscular Given 09/23/19 1155)    Initial Impression / Assessment and Plan / UC Course  I have reviewed the triage vital signs and the nursing notes.  Pertinent labs & imaging results that were available during my care of the patient were reviewed  by me and considered in my medical decision making (see chart for details).    Patient administered a headache cocktail while here in clinic today consisting of Toradol 60 mg, Reglan 10 mg, Decadron 10 and Benadryl 25 mg a administered intramuscular. Patient remained in clinic for approximately 30 minutes and achieved no relief of headache pain. Patient is accompanied by her husband today.  Explained in depth the that patient needs further work-up and evaluation given the atypical presentation of her headache over the course of 1 week.  Patient has taken multiple doses and combinations of many different medication classes which could be contributing to subsequent rebound headaches however given limited diagnostic tools available in urgent care setting patient has been deferred to the nearest emergency department for evaluation of current symptoms.  A total of 50 minutes spent, greater than 50 % of this time was spent evaluation, reviewing patient's extensive history, obtaining HPI with assistance of spouse and patient, monitoring patient's response to medication, counseling and coordination of care.   Final Clinical Impressions(s) / UC Diagnoses   Final diagnoses:  Persistent headaches     Discharge Instructions     Go immediately to the emergency department if this headache has not resolved by this evening. Do not take any additional naproxen, ibuprofen for at least 8 hours as you were given a large dose of Toradol which is a NSAID/anti-inflammatory.  You should schedule follow-up with your primary care provider within the next 3 days given the diversity of symptoms you have had over the course of the last week for further work-up and evaluation.    ED Prescriptions    None     PDMP not reviewed this encounter.   Scot Jun, FNP 09/23/19 1316    Scot Jun, FNP 09/23/19 1319    Scot Jun, FNP 09/23/19 1320

## 2019-09-23 NOTE — Telephone Encounter (Signed)
Patient contacted the office. Patient was in very obvious distress and was crying, and stated she has had a very severe migraine for over 1 week. She states this has caused her to be very nauseous, vomiting, and mild diarrhea. Patient states her husband was taking her to the Urgent Care in [place]. Will route to [place] as Juluis Rainier

## 2019-09-23 NOTE — ED Triage Notes (Signed)
Patient presents to urgent care today with symptoms of migraine. Symptoms began on last monday. They have tried excedrin migraine and naproxen sodium with no relief of symptoms.

## 2019-09-23 NOTE — ED Notes (Signed)
Patient states she took 2mg  extended release xanax at 1800

## 2019-09-23 NOTE — ED Triage Notes (Signed)
Patient states at onset of symptoms she had diarrhea and dizziness and a low grade fever.

## 2019-09-23 NOTE — ED Notes (Signed)
Patient is being discharged from the Urgent Care and sent to the Emergency Department via POV . Per Molli Barrows, NP, patient is in need of higher level of care due to migraine, no relief from migraine cocktail. Patient is aware and verbalizes understanding of plan of care.  Vitals:   09/23/19 1112  BP: 125/73  Pulse: (!) 104  Resp: 18  Temp: 99 F (37.2 C)  SpO2: 100%

## 2019-09-23 NOTE — Discharge Instructions (Signed)
Go immediately to the emergency department if this headache has not resolved by this evening. Do not take any additional naproxen, ibuprofen for at least 8 hours as you were given a large dose of Toradol which is a NSAID/anti-inflammatory.  You should schedule follow-up with your primary care provider within the next 3 days given the diversity of symptoms you have had over the course of the last week for further work-up and evaluation.

## 2019-09-23 NOTE — Telephone Encounter (Signed)
[facility] Night - Client Nonclinical Telephone Record AccessNurse Client [facility] Night - Client Client Site [place] Physician Alma Friendly - NP Contact Type Call Who Is Calling Patient / Member / Family / Caregiver Caller Name [place] Phone Number [phone] Patient Name Tanya [name] Patient DOB 01-27-1971 Call Type Message Only Information Provided Reason for Call Request to Schedule Office Appointment Initial Comment Caller states she has had a migraine for a week. Additional Comment declined triage . provided office hours Disp. Time Disposition Final User 09/22/2019 5:25:30 PM General Information Provided Yes Idolina Primer Call Closed By: Idolina Primer Transaction Date/Time: 09/22/2019 5:23:00 PM (ET)

## 2019-09-24 LAB — CBC WITH DIFFERENTIAL/PLATELET
Abs Immature Granulocytes: 0.04 10*3/uL (ref 0.00–0.07)
Basophils Absolute: 0 10*3/uL (ref 0.0–0.1)
Basophils Relative: 0 %
Eosinophils Absolute: 0 10*3/uL (ref 0.0–0.5)
Eosinophils Relative: 0 %
HCT: 39.6 % (ref 36.0–46.0)
Hemoglobin: 14.2 g/dL (ref 12.0–15.0)
Immature Granulocytes: 0 %
Lymphocytes Relative: 5 %
Lymphs Abs: 0.5 10*3/uL — ABNORMAL LOW (ref 0.7–4.0)
MCH: 32.4 pg (ref 26.0–34.0)
MCHC: 35.9 g/dL (ref 30.0–36.0)
MCV: 90.4 fL (ref 80.0–100.0)
Monocytes Absolute: 0.2 10*3/uL (ref 0.1–1.0)
Monocytes Relative: 2 %
Neutro Abs: 10.5 10*3/uL — ABNORMAL HIGH (ref 1.7–7.7)
Neutrophils Relative %: 93 %
Platelets: 301 10*3/uL (ref 150–400)
RBC: 4.38 MIL/uL (ref 3.87–5.11)
RDW: 11.6 % (ref 11.5–15.5)
WBC: 11.4 10*3/uL — ABNORMAL HIGH (ref 4.0–10.5)
nRBC: 0 % (ref 0.0–0.2)

## 2019-09-24 LAB — BASIC METABOLIC PANEL
Anion gap: 11 (ref 5–15)
BUN: 17 mg/dL (ref 6–20)
CO2: 25 mmol/L (ref 22–32)
Calcium: 8.9 mg/dL (ref 8.9–10.3)
Chloride: 98 mmol/L (ref 98–111)
Creatinine, Ser: 0.95 mg/dL (ref 0.44–1.00)
GFR calc Af Amer: >60 mL/min (ref >60)
GFR calc non Af Amer: >60 mL/min (ref >60)
Glucose, Bld: 142 mg/dL — ABNORMAL HIGH (ref 70–99)
Potassium: 3.4 mmol/L — ABNORMAL LOW (ref 3.5–5.1)
Sodium: 134 mmol/L — ABNORMAL LOW (ref 135–145)

## 2019-09-24 LAB — MAGNESIUM: Magnesium: 1.9 mg/dL (ref 1.7–2.4)

## 2019-09-24 MED ORDER — DIHYDROERGOTAMINE MESYLATE 1 MG/ML IJ SOLN
1.0000 mg | Freq: Once | INTRAMUSCULAR | Status: AC
  Administered 2019-09-24: 1 mg via INTRAVENOUS
  Filled 2019-09-24 (×2): qty 1

## 2019-09-24 MED ORDER — MAGNESIUM SULFATE 2 GM/50ML IV SOLN
2.0000 g | Freq: Once | INTRAVENOUS | Status: AC
  Administered 2019-09-24: 2 g via INTRAVENOUS
  Filled 2019-09-24: qty 50

## 2019-09-24 NOTE — Telephone Encounter (Signed)
Per chart review pt was seen 09/23/19 at [facility] ED.

## 2019-09-24 NOTE — ED Notes (Signed)
Pt d/c home per MD order. Discharge summary reviewed with pt, pt verbalizes understanding,. Off unit via [place]- husband here for discharge ride home

## 2019-09-24 NOTE — Discharge Instructions (Signed)
Return if you are having any problems. 

## 2019-09-24 NOTE — Telephone Encounter (Signed)
[place] Night - Client TELEPHONE ADVICE RECORD AccessNurse Patient Name: Tanya [name] Gender: Female DOB: 09-Mar-1971 Age: 49 Y 3 M 22 D Return Phone Number: [phone] (Primary), [phone] (Secondary) Address: [place] [address] Client [name] Night - Client Client Site [place] Physician Alma Friendly - NP Contact Type Call Who Is Calling Patient / Member / Family / Caregiver Call Type Triage / Clinical Caller Name Tanya [name] Relationship To Patient Spouse Return Phone Number [phone] (Primary) Chief Complaint Headache Reason for Call Symptomatic / Request for Christian states his wife has had a migraine for a week. She was seen in urgent care and she received shots. She was sent to the hospital ER for a CT. They have been there since 1:45 PM. He would like to know if the CT can be schedules. She is in too much pain to be waiting. She still hasn't been seen. Translation No Nurse Assessment Nurse: Mancel Bale, RN, Butch Penny Date/Time Eilene Ghazi Time): 09/23/2019 5:23:23 PM Confirm and document reason for call. If symptomatic, describe symptoms. ---Caller states his wife has been having severe migraines since July 2. She was evaluated in the UC today and given medications and she was directed to the ED. They in the waiting room ED waiting for care. He is wondering if the wait and can be shortened. Has the patient had close contact with a person known or suspected to have the novel coronavirus illness OR traveled / lives in area with major community spread (including international travel) in the last 14 days from the onset of symptoms? * If Asymptomatic, screen for exposure and travel within the last 14 days. ---No Does the patient have any new or worsening symptoms? ---Yes Will a triage be completed? ---Yes Related visit to physician  within the last 2 weeks? ---Yes Does the PT have any chronic conditions? (i.e. diabetes, asthma, this includes High risk factors for pregnancy, etc.) ---Yes List chronic conditions. ---CRPS, PTSD Is the patient pregnant or possibly pregnant? (Ask all females between the ages of 76-55) ---No Is this a behavioral health or substance abuse call? ---No PLEASE NOTE: All timestamps contained within this report are represented as [place] Standard Time. CONFIDENTIALTY NOTICE: This fax transmission is intended only for the addressee. It contains information that is legally privileged, confidential or otherwise protected from use or disclosure. If you are not the intended recipient, you are strictly prohibited from reviewing, disclosing, copying using or disseminating any of this information or taking any action in reliance on or regarding this information. If you have received this fax in error, please notify us immediately by telephone so that we can arrange for its return to [place]. Phone: [phone], Toll-Free: [phone], Fax: [phone] Page: 2 of 2 Call Id: [id number] Guidelines Guideline Title Affirmed Question Affirmed Notes Nurse Date/Time Eilene Ghazi Time) Headache Severe pain in one eye Sheran Fava 09/23/2019 5:25:37 PM Disp. Time Eilene Ghazi Time) Disposition Final User 09/23/2019 5:29:51 PM Go to ED Now Yes Mancel Bale, RN, [place] Disagree/Comply Comply Caller Understands Yes PreDisposition Go to ED Care Advice Given Per Guideline GO TO ED NOW: * You need to be seen in the Emergency Department. * Go to the ED at ___________ [place] now. Drive carefully. Comments User: Marijo Conception, RN Date/Time Eilene Ghazi Time): 09/23/2019 5:31:47 PM Informed caller that is wife is in the best place to receive care with her current symptoms and that the ED  MDs will make an evaluation and treatment plan. States his wife will continue to stay at the ED then based on this  recommendation. Referrals [facility] - ED

## 2019-09-27 DIAGNOSIS — F411 Generalized anxiety disorder: Secondary | ICD-10-CM | POA: Diagnosis not present

## 2019-09-27 DIAGNOSIS — F4323 Adjustment disorder with mixed anxiety and depressed mood: Secondary | ICD-10-CM | POA: Diagnosis not present

## 2019-09-27 DIAGNOSIS — F4312 Post-traumatic stress disorder, chronic: Secondary | ICD-10-CM | POA: Diagnosis not present

## 2019-10-04 DIAGNOSIS — E039 Hypothyroidism, unspecified: Secondary | ICD-10-CM | POA: Diagnosis not present

## 2019-10-04 DIAGNOSIS — E669 Obesity, unspecified: Secondary | ICD-10-CM | POA: Diagnosis not present

## 2019-10-04 DIAGNOSIS — R601 Generalized edema: Secondary | ICD-10-CM | POA: Diagnosis not present

## 2019-10-04 DIAGNOSIS — E1165 Type 2 diabetes mellitus with hyperglycemia: Secondary | ICD-10-CM | POA: Diagnosis not present

## 2019-10-04 DIAGNOSIS — F909 Attention-deficit hyperactivity disorder, unspecified type: Secondary | ICD-10-CM | POA: Diagnosis not present

## 2019-10-11 ENCOUNTER — Other Ambulatory Visit: Payer: Self-pay | Admitting: Family Medicine

## 2019-10-11 DIAGNOSIS — E039 Hypothyroidism, unspecified: Secondary | ICD-10-CM

## 2019-10-14 DIAGNOSIS — F419 Anxiety disorder, unspecified: Secondary | ICD-10-CM | POA: Diagnosis not present

## 2019-10-14 DIAGNOSIS — E248 Other Cushing's syndrome: Secondary | ICD-10-CM | POA: Diagnosis not present

## 2019-10-14 DIAGNOSIS — G9050 Complex regional pain syndrome I, unspecified: Secondary | ICD-10-CM | POA: Diagnosis not present

## 2019-10-14 DIAGNOSIS — E039 Hypothyroidism, unspecified: Secondary | ICD-10-CM | POA: Diagnosis not present

## 2019-10-14 DIAGNOSIS — F909 Attention-deficit hyperactivity disorder, unspecified type: Secondary | ICD-10-CM | POA: Diagnosis not present

## 2019-10-14 DIAGNOSIS — E669 Obesity, unspecified: Secondary | ICD-10-CM | POA: Diagnosis not present

## 2019-10-14 DIAGNOSIS — E1165 Type 2 diabetes mellitus with hyperglycemia: Secondary | ICD-10-CM | POA: Diagnosis not present

## 2019-10-14 DIAGNOSIS — Z6830 Body mass index (BMI) 30.0-30.9, adult: Secondary | ICD-10-CM | POA: Diagnosis not present

## 2019-10-14 DIAGNOSIS — F341 Dysthymic disorder: Secondary | ICD-10-CM | POA: Diagnosis not present

## 2019-10-14 DIAGNOSIS — L680 Hirsutism: Secondary | ICD-10-CM | POA: Diagnosis not present

## 2019-10-14 DIAGNOSIS — J45909 Unspecified asthma, uncomplicated: Secondary | ICD-10-CM | POA: Diagnosis not present

## 2019-10-14 DIAGNOSIS — Z7189 Other specified counseling: Secondary | ICD-10-CM | POA: Diagnosis not present

## 2019-10-16 ENCOUNTER — Other Ambulatory Visit: Payer: Self-pay | Admitting: Primary Care

## 2019-10-16 DIAGNOSIS — G9050 Complex regional pain syndrome I, unspecified: Secondary | ICD-10-CM

## 2019-10-23 ENCOUNTER — Other Ambulatory Visit: Payer: Self-pay | Admitting: Dermatology

## 2019-10-24 ENCOUNTER — Telehealth: Payer: Self-pay

## 2019-10-24 NOTE — Telephone Encounter (Signed)
Pt called requesting a refill of Doxycycline, discussed it has been over 1 year since she was in the office, she will need a return visit appt before we can refill Doxycycline

## 2019-11-05 DIAGNOSIS — F411 Generalized anxiety disorder: Secondary | ICD-10-CM | POA: Diagnosis not present

## 2019-11-05 DIAGNOSIS — F4312 Post-traumatic stress disorder, chronic: Secondary | ICD-10-CM | POA: Diagnosis not present

## 2019-11-07 DIAGNOSIS — Z9689 Presence of other specified functional implants: Secondary | ICD-10-CM | POA: Diagnosis not present

## 2019-11-07 DIAGNOSIS — M25551 Pain in right hip: Secondary | ICD-10-CM | POA: Diagnosis not present

## 2019-11-07 DIAGNOSIS — G894 Chronic pain syndrome: Secondary | ICD-10-CM | POA: Diagnosis not present

## 2019-11-07 DIAGNOSIS — G90519 Complex regional pain syndrome I of unspecified upper limb: Secondary | ICD-10-CM | POA: Diagnosis not present

## 2019-11-11 ENCOUNTER — Telehealth: Payer: Self-pay

## 2019-11-11 ENCOUNTER — Encounter: Payer: PPO | Admitting: Primary Care

## 2019-11-11 DIAGNOSIS — Z0289 Encounter for other administrative examinations: Secondary | ICD-10-CM

## 2019-11-11 NOTE — Telephone Encounter (Signed)
[place] Day - Client TELEPHONE ADVICE RECORD AccessNurse Patient Name: Tanya [name] Gender: Female DOB: Aug 15, 1970 Age: 49 Y 18 M 10 D Return Phone Number: [phone] (Primary) Address: City/State/Zip: [place] [address] Client [name] - Client Client Site [place] - [place] Physician Alma Friendly - NP Contact Type Call Who Is Calling Patient / Member / Family / Caregiver Call Type Triage / Clinical Relationship To Patient Self Return Phone Number [phone] (Primary) Chief Complaint BREATHING - fast, heavy or wheezing Reason for Call Symptomatic / Request for Health Information Initial Comment Office is transferring [place] who has asthma and cough. Missed her appt today due to oversleeping today because she feels so bad. Office provided information where to get tested. Translation No Nurse Assessment Nurse: Jearld Pies, RN, Lovena Le Date/Time Eilene Ghazi Time): 11/11/2019 12:01:24 PM Confirm and document reason for call. If symptomatic, describe symptoms. ---Caller who has asthma and cough. symptoms started Saturday. On Sunday symptoms progressed and woke up with chills, headache, sore throat. Does not have thermometer. Taking pain and fever reducer around the clock with no relief. Current pain level 4/10. Eating, drinking, and urinating normally. Denies vd or any other symptoms at this time. Has the patient had close contact with a person known or suspected to have the novel coronavirus illness OR traveled / lives in area with major community spread (including international travel) in the last 14 days from the onset of symptoms? * If Asymptomatic, screen for exposure and travel within the last 14 days. ---No Does the patient have any new or worsening symptoms? ---Yes Will a triage be completed? ---Yes Related visit to physician within the last 2 weeks? ---Yes Does the PT have any chronic  conditions? (i.e. diabetes, asthma, this includes High risk factors for pregnancy, etc.) ---Unknown Is the patient pregnant or possibly pregnant? (Ask all females between the ages of 69-55) ---No Is this a behavioral health or substance abuse call? ---No Guidelines Guideline Title Affirmed Question Affirmed Notes Nurse Date/Time ([place] Time) COVID-19 - Diagnosed or Suspected [1] COVID-19 infection suspected by caller or Jearld Pies, RN, Lovena Le 11/11/2019 12:06:16 PM PLEASE NOTE: All timestamps contained within this report are represented as [place] Standard Time. CONFIDENTIALTY NOTICE: This fax transmission is intended only for the addressee. It contains information that is legally privileged, confidential or otherwise protected from use or disclosure. If you are not the intended recipient, you are strictly prohibited from reviewing, disclosing, copying using or disseminating any of this information or taking any action in reliance on or regarding this information. If you have received this fax in error, please notify us immediately by telephone so that we can arrange for its return to [place]. Phone: [phone], Toll-Free: [phone], Fax: [phone] Page: 2 of 2 Call Id: [id number] Guidelines Guideline Title Affirmed Question Affirmed Notes Nurse Date/Time Eilene Ghazi Time) triager AND [2] mild symptoms (cough, fever, or others) AND [3] no complications or SOB Disp. Time Eilene Ghazi Time) Disposition Final User 11/11/2019 11:59:42 AM Send to Urgent Vassie Loll 11/11/2019 12:10:22 PM Call PCP when Office is Open Yes Jearld Pies, RN, Apolonio Schneiders Disagree/Comply Comply Caller Understands Yes PreDisposition Call Doctor Care Advice Given Per Guideline CALL PCP WHEN OFFICE IS OPEN: * You become worse. * Chest pain or difficulty breathing occurs * Fever returns after being gone for 24 hours * Fever lasts over 3 days * Fever over 103 F (39.4 C) CALL BACK IF: Referrals REFERRED TO PCP  OFFICE

## 2019-11-11 NOTE — Telephone Encounter (Signed)
Please tell her that I'm sorry about her symptoms, please don't worry about missing her appointment! She can reschedule when she's ready.

## 2019-11-11 NOTE — Telephone Encounter (Signed)
Per appt notes pt had CPX scheduled today at 10:40 with Gentry Fitz NP. Apparently from this note pt over slept and did not have appt today. I spoke with pt; pt is planning on going to an [facility] in [place] today for covid testing; pt sounded very congested and she said she might see one of the providers at an UC if has to see someone to get testing done. Pt wanted Gentry Fitz NP to know why she did not keep her appt this morning.

## 2019-11-13 DIAGNOSIS — Z20822 Contact with and (suspected) exposure to covid-19: Secondary | ICD-10-CM | POA: Diagnosis not present

## 2019-11-13 DIAGNOSIS — Z03818 Encounter for observation for suspected exposure to other biological agents ruled out: Secondary | ICD-10-CM | POA: Diagnosis not present

## 2019-11-13 DIAGNOSIS — R519 Headache, unspecified: Secondary | ICD-10-CM | POA: Diagnosis not present

## 2019-11-20 DIAGNOSIS — F3341 Major depressive disorder, recurrent, in partial remission: Secondary | ICD-10-CM | POA: Diagnosis not present

## 2019-11-20 DIAGNOSIS — F411 Generalized anxiety disorder: Secondary | ICD-10-CM | POA: Diagnosis not present

## 2019-11-20 DIAGNOSIS — F4312 Post-traumatic stress disorder, chronic: Secondary | ICD-10-CM | POA: Diagnosis not present

## 2019-11-27 DIAGNOSIS — F411 Generalized anxiety disorder: Secondary | ICD-10-CM | POA: Diagnosis not present

## 2019-11-27 DIAGNOSIS — F4312 Post-traumatic stress disorder, chronic: Secondary | ICD-10-CM | POA: Diagnosis not present

## 2019-11-27 DIAGNOSIS — F3341 Major depressive disorder, recurrent, in partial remission: Secondary | ICD-10-CM | POA: Diagnosis not present

## 2019-11-29 DIAGNOSIS — M25551 Pain in right hip: Secondary | ICD-10-CM | POA: Diagnosis not present

## 2019-11-29 DIAGNOSIS — M25552 Pain in left hip: Secondary | ICD-10-CM | POA: Diagnosis not present

## 2019-12-03 ENCOUNTER — Other Ambulatory Visit: Payer: Self-pay | Admitting: *Deleted

## 2019-12-03 ENCOUNTER — Ambulatory Visit
Admission: RE | Admit: 2019-12-03 | Discharge: 2019-12-03 | Disposition: A | Discharge status: Home / Self Care | Payer: PPO | Source: Ambulatory Visit | Attending: *Deleted | Admitting: *Deleted

## 2019-12-03 DIAGNOSIS — G894 Chronic pain syndrome: Secondary | ICD-10-CM

## 2019-12-03 DIAGNOSIS — M545 Low back pain: Secondary | ICD-10-CM | POA: Diagnosis not present

## 2019-12-03 DIAGNOSIS — M25551 Pain in right hip: Secondary | ICD-10-CM | POA: Diagnosis not present

## 2019-12-03 DIAGNOSIS — M25552 Pain in left hip: Secondary | ICD-10-CM | POA: Diagnosis not present

## 2019-12-03 DIAGNOSIS — M47816 Spondylosis without myelopathy or radiculopathy, lumbar region: Secondary | ICD-10-CM | POA: Diagnosis not present

## 2019-12-12 NOTE — Telephone Encounter (Signed)
Please add to chart

## 2019-12-13 DIAGNOSIS — G894 Chronic pain syndrome: Secondary | ICD-10-CM | POA: Diagnosis not present

## 2019-12-13 DIAGNOSIS — M47896 Other spondylosis, lumbar region: Secondary | ICD-10-CM | POA: Diagnosis not present

## 2019-12-13 DIAGNOSIS — M47816 Spondylosis without myelopathy or radiculopathy, lumbar region: Secondary | ICD-10-CM | POA: Diagnosis not present

## 2019-12-19 ENCOUNTER — Other Ambulatory Visit: Payer: Self-pay

## 2019-12-19 ENCOUNTER — Encounter: Payer: Self-pay | Admitting: Dermatology

## 2019-12-19 ENCOUNTER — Ambulatory Visit: Payer: PPO | Admitting: Dermatology

## 2019-12-19 DIAGNOSIS — S80869A Insect bite (nonvenomous), unspecified lower leg, initial encounter: Secondary | ICD-10-CM

## 2019-12-19 DIAGNOSIS — L705 Acne excoriee des jeunes filles: Secondary | ICD-10-CM

## 2019-12-19 DIAGNOSIS — I872 Venous insufficiency (chronic) (peripheral): Secondary | ICD-10-CM

## 2019-12-19 DIAGNOSIS — S80861A Insect bite (nonvenomous), right lower leg, initial encounter: Secondary | ICD-10-CM

## 2019-12-19 DIAGNOSIS — W57XXXA Bitten or stung by nonvenomous insect and other nonvenomous arthropods, initial encounter: Secondary | ICD-10-CM

## 2019-12-19 MED ORDER — DOXYCYCLINE MONOHYDRATE 100 MG PO CAPS: 100.0000 mg | ORAL_CAPSULE | Freq: Two times a day (BID) | ORAL | 0 refills | Status: AC

## 2019-12-19 MED ORDER — CLOBETASOL PROPIONATE 0.05 % EX OINT: TOPICAL_OINTMENT | CUTANEOUS | 1 refills | Status: AC

## 2019-12-19 NOTE — Patient Instructions (Signed)
Recommend daily broad spectrum sunscreen SPF 30+ to sun-exposed areas, reapply every 2 hours as needed. Call for new or changing lesions.  

## 2019-12-19 NOTE — Progress Notes (Signed)
   Follow-Up Visit   Subjective  Tanya [name] is a 49 y.o. female who presents for the following: Area of concern.  Patient presents today for a few areas of concern on her face and b/l lower leg. Patient does not have h/o skin cancer.  The following portions of the chart were reviewed this encounter and updated as appropriate:  Tobacco  Allergies  Meds  Problems  Med Hx  Surg Hx  Fam Hx      Review of Systems:  No other skin or systemic complaints except as noted in HPI or Assessment and Plan.  Objective  Well appearing patient in no apparent distress; mood and affect are within normal limits.  A focused examination was performed including Face and bilateral lower legs.  Relevant physical exam findings are noted in the Assessment and Plan.  Objective  Right Lower Leg - Anterior: Erythematous pink papules  Objective  bilateral legs: Erythema pretibia bilaterally  Objective  chin: Many inflammatory papules, excoriated   Assessment & Plan  Insect bite of lower extremity, unspecified laterality, initial encounter Right Lower Leg - Anterior  Wash b/l lower legs with chlorhexidene wash to avoid infection at open areas.  Start clobetasol ointment to affected areas twice daily, avoid to open areas.  clobetasol ointment (TEMOVATE) 0.05 % - Right Lower Leg - Anterior  Venous stasis dermatitis of left lower extremity bilateral legs  Recommend wearing compression stockings Start clobetasol ointment twice a day as needed x up to 2 weeks Recheck on follow-up  Acne excoriee chin  Chronic, flared. Possibly superinfected today.  Start 100 mg doxycycline monohydrate bid for 10 days. Plan to switch to doxycycline to 20 mg BID vs start topicals at follow-up.   Doxycycline should be taken with food to prevent nausea. Do not lay down for 30 minutes after taking. Be cautious with sun exposure and use good sun protection while on this medication. Pregnant women should not  take this medication.    doxycycline (MONODOX) 100 MG capsule - chin  Return in about 3 weeks (around 01/09/2020) for Acne.  IDonzetta Kohut, CMA, am acting as scribe for Forest Gleason, MD .  Documentation: I have reviewed the above documentation for accuracy and completeness, and I agree with the above.  Forest Gleason, MD

## 2019-12-20 DIAGNOSIS — M47816 Spondylosis without myelopathy or radiculopathy, lumbar region: Secondary | ICD-10-CM | POA: Diagnosis not present

## 2020-01-07 ENCOUNTER — Encounter: Payer: Self-pay | Admitting: Dermatology

## 2020-01-09 ENCOUNTER — Encounter: Payer: PPO | Admitting: Primary Care

## 2020-01-14 ENCOUNTER — Other Ambulatory Visit: Payer: Self-pay

## 2020-01-14 ENCOUNTER — Ambulatory Visit: Payer: PPO | Admitting: Dermatology

## 2020-01-14 DIAGNOSIS — L700 Acne vulgaris: Secondary | ICD-10-CM | POA: Diagnosis not present

## 2020-01-14 DIAGNOSIS — L281 Prurigo nodularis: Secondary | ICD-10-CM

## 2020-01-14 DIAGNOSIS — L280 Lichen simplex chronicus: Secondary | ICD-10-CM | POA: Diagnosis not present

## 2020-01-14 MED ORDER — DOXYCYCLINE HYCLATE 20 MG PO TABS: 20.0000 mg | ORAL_TABLET | Freq: Two times a day (BID) | ORAL | 1 refills | Status: DC

## 2020-01-14 MED ORDER — MUPIROCIN 2 % EX OINT: TOPICAL_OINTMENT | CUTANEOUS | 0 refills | Status: DC

## 2020-01-14 NOTE — Progress Notes (Signed)
Follow-Up Visit   Subjective  Tanya [name] is a 49 y.o. female who presents for the following: acne excoriee (face - patient finished the Doxycycline 100mg  po BID x 10 days, but still has lesions on the face ) and bumps at legs (B/L leg - patient currently using Chlorhexidine wash and Clobetasol ointment QD, but has bites on her thighs now).  The following portions of the chart were reviewed this encounter and updated as appropriate:  Tobacco  Allergies  Meds  Problems  Med Hx  Surg Hx  Fam Hx     Review of Systems:  No other skin or systemic complaints except as noted in HPI or Assessment and Plan.  Objective  Well appearing patient in no apparent distress; mood and affect are within normal limits.  A focused examination was performed including the legs, neck and face . Relevant physical exam findings are noted in the Assessment and Plan.  Objective  Face: Excoriated Inflammatory papules at chin  Objective  B/L leg: Excoriated firm pink papules and plaques at legs  Objective  B/L thigh (9): Excoriated firm pink papules at upper thighs  Assessment & Plan  Acne vulgaris Face  Chronic, improved but not at goal  Start Doxycycline 20mg  po BID take with food. Doxycycline should be taken with food to prevent nausea. Do not lay down for 30 minutes after taking. Be cautious with sun exposure and use good sun protection while on this medication. Pregnant women should not take this medication.   Start Mupirocin 2% ointment to aa's QD-BID until healed. Avoid scratching or picking.  Start N-Acetylcysteine 600 mg three times per day (Jarrow formulas or other)   doxycycline (PERIOSTAT) 20 MG tablet - Face  mupirocin ointment (BACTROBAN) 2 % - Face  Lichen simplex chronicus B/L leg  Avoid picking/digging at areas  Continue Chlorhexidine wash daily and Clobetasol 0.05% ointment to aa's QD and cover with bandaid until thinned out. Then D/C and use PRN.    Recommend  electric razor, bump fighter razor rather than epilating to reduce ingrown hairs. Use Chlorhexidine wash before epilating and Clobetasol ointment to inflamed areas.   Start N-Acetylcysteine 600 mg three times per day (Jarrow formulas or other)  Prurigo nodularis (9) B/L thigh  Start N-Acetylcysteine 600 mg three times per day (Jarrow formulas or other)  Continue Clobetasol ointment to thicker areas then cover with a bandaid QD-BID PRN. Then use PRN thereafter.  Topical steroids (such as triamcinolone, fluocinolone, fluocinonide, mometasone, clobetasol, halobetasol, betamethasone, hydrocortisone) can cause thinning and lightening of the skin if they are used for too long in the same area. Your physician has selected the right strength medicine for your problem and area affected on the body. Please use your medication only as directed by your physician to prevent side effects.    Intralesional injection - B/L thigh Location: L thigh x 2, L knee x 1, L pretibial x 2, R thigh x 4  Informed Consent: Discussed risks (infection, pain, bleeding, bruising, thinning of the skin, loss of skin pigment, lack of resolution, and recurrence of lesion) and benefits of the procedure, as well as the alternatives. Informed consent was obtained. Preparation: The area was prepared a standard fashion.  Procedure Details: An intralesional injection was performed with Kenalog 2 mg/cc. 0.5 cc in total were injected.  Total number of injections: 9  Plan: The patient was instructed on post-op care. Recommend OTC analgesia as needed for pain.   Return in about 4 weeks (around  02/11/2020).  Luther Redo, CMA, am acting as scribe for Forest Gleason, MD .  Documentation: I have reviewed the above documentation for accuracy and completeness, and I agree with the above.  Forest Gleason, MD

## 2020-01-14 NOTE — Patient Instructions (Addendum)
Start N-Acetylcysteine 600 mg three times per day (Jarrow formulas or other) Start doxycycline 20 mg twice a day with food.  Doxycycline should be taken with food to prevent nausea. Do not lay down for 30 minutes after taking. Be cautious with sun exposure and use good sun protection while on this medication. Pregnant women should not take this medication.   Continue chlorhexidine (hibiclens) daily to legs and low face. Do not get in eyes, ears or vaginal area.   Continue clobetasol once to twice a day to itchy rough raised areas at legs. Avoid applying to face, groin, and axilla.   Topical steroids (such as triamcinolone, fluocinolone, fluocinonide, mometasone, clobetasol, halobetasol, betamethasone, hydrocortisone) can cause thinning and lightening of the skin if they are used for too long in the same area. Your physician has selected the right strength medicine for your problem and area affected on the body. Please use your medication only as directed by your physician to prevent side effects.

## 2020-01-15 ENCOUNTER — Other Ambulatory Visit: Payer: Self-pay | Admitting: Primary Care

## 2020-01-15 ENCOUNTER — Telehealth: Payer: Self-pay | Admitting: Primary Care

## 2020-01-15 DIAGNOSIS — I10 Essential (primary) hypertension: Secondary | ICD-10-CM

## 2020-01-15 DIAGNOSIS — E039 Hypothyroidism, unspecified: Secondary | ICD-10-CM

## 2020-01-15 DIAGNOSIS — E282 Polycystic ovarian syndrome: Secondary | ICD-10-CM

## 2020-01-15 NOTE — Telephone Encounter (Signed)
Pt stated she was charged no show for 8/30 she was told she would not be charged for this  She was sick  She had covid symptoms

## 2020-01-16 ENCOUNTER — Encounter: Payer: Self-pay | Admitting: Dermatology

## 2020-01-17 ENCOUNTER — Other Ambulatory Visit: Payer: PPO

## 2020-01-21 ENCOUNTER — Encounter: Payer: Self-pay | Admitting: Primary Care

## 2020-01-21 ENCOUNTER — Ambulatory Visit (INDEPENDENT_AMBULATORY_CARE_PROVIDER_SITE_OTHER): Payer: PPO | Admitting: Primary Care

## 2020-01-21 ENCOUNTER — Other Ambulatory Visit (HOSPITAL_COMMUNITY)
Admission: RE | Admit: 2020-01-21 | Discharge: 2020-01-21 | Disposition: A | Discharge status: Home / Self Care | Payer: PPO | Source: Ambulatory Visit | Attending: Primary Care | Admitting: Primary Care

## 2020-01-21 ENCOUNTER — Other Ambulatory Visit: Payer: Self-pay

## 2020-01-21 VITALS — BP 118/64 | HR 79 | Temp 98.4°F | Ht 60.0 in | Wt 156.0 lb

## 2020-01-21 DIAGNOSIS — J4520 Mild intermittent asthma, uncomplicated: Secondary | ICD-10-CM

## 2020-01-21 DIAGNOSIS — Z1151 Encounter for screening for human papillomavirus (HPV): Secondary | ICD-10-CM | POA: Diagnosis not present

## 2020-01-21 DIAGNOSIS — Z1159 Encounter for screening for other viral diseases: Secondary | ICD-10-CM | POA: Diagnosis not present

## 2020-01-21 DIAGNOSIS — R739 Hyperglycemia, unspecified: Secondary | ICD-10-CM | POA: Insufficient documentation

## 2020-01-21 DIAGNOSIS — F32A Depression, unspecified: Secondary | ICD-10-CM | POA: Diagnosis not present

## 2020-01-21 DIAGNOSIS — Z124 Encounter for screening for malignant neoplasm of cervix: Secondary | ICD-10-CM | POA: Insufficient documentation

## 2020-01-21 DIAGNOSIS — G9050 Complex regional pain syndrome I, unspecified: Secondary | ICD-10-CM

## 2020-01-21 DIAGNOSIS — I10 Essential (primary) hypertension: Secondary | ICD-10-CM | POA: Diagnosis not present

## 2020-01-21 DIAGNOSIS — Z1231 Encounter for screening mammogram for malignant neoplasm of breast: Secondary | ICD-10-CM

## 2020-01-21 DIAGNOSIS — R102 Pelvic and perineal pain: Secondary | ICD-10-CM | POA: Insufficient documentation

## 2020-01-21 DIAGNOSIS — F988 Other specified behavioral and emotional disorders with onset usually occurring in childhood and adolescence: Secondary | ICD-10-CM | POA: Diagnosis not present

## 2020-01-21 DIAGNOSIS — Z1211 Encounter for screening for malignant neoplasm of colon: Secondary | ICD-10-CM

## 2020-01-21 DIAGNOSIS — F4310 Post-traumatic stress disorder, unspecified: Secondary | ICD-10-CM

## 2020-01-21 DIAGNOSIS — G894 Chronic pain syndrome: Secondary | ICD-10-CM

## 2020-01-21 DIAGNOSIS — E282 Polycystic ovarian syndrome: Secondary | ICD-10-CM

## 2020-01-21 DIAGNOSIS — E039 Hypothyroidism, unspecified: Secondary | ICD-10-CM | POA: Diagnosis not present

## 2020-01-21 DIAGNOSIS — Z0000 Encounter for general adult medical examination without abnormal findings: Secondary | ICD-10-CM | POA: Diagnosis not present

## 2020-01-21 DIAGNOSIS — Z23 Encounter for immunization: Secondary | ICD-10-CM | POA: Diagnosis not present

## 2020-01-21 HISTORY — DX: Pelvic and perineal pain: R10.2

## 2020-01-21 LAB — POC URINALSYSI DIPSTICK (AUTOMATED)
Bilirubin, UA: NEGATIVE
Blood, UA: NEGATIVE
Glucose, UA: NEGATIVE
Ketones, UA: NEGATIVE
Leukocytes, UA: NEGATIVE
Nitrite, UA: NEGATIVE
Protein, UA: NEGATIVE
Spec Grav, UA: 1.025 (ref 1.010–1.025)
Urobilinogen, UA: 0.2 E.U./dL
pH, UA: 6 (ref 5.0–8.0)

## 2020-01-21 LAB — LIPID PANEL
Cholesterol: 158 mg/dL (ref 0–200)
HDL: 68.2 mg/dL (ref >39.00)
LDL Cholesterol: 78 mg/dL (ref 0–99)
NonHDL: 89.82
Total CHOL/HDL Ratio: 2
Triglycerides: 60 mg/dL (ref 0.0–149.0)
VLDL: 12 mg/dL (ref 0.0–40.0)

## 2020-01-21 LAB — COMPREHENSIVE METABOLIC PANEL
ALT: 17 U/L (ref 0–35)
AST: 14 U/L (ref 0–37)
Albumin: 4.4 g/dL (ref 3.5–5.2)
Alkaline Phosphatase: 51 U/L (ref 39–117)
BUN: 11 mg/dL (ref 6–23)
CO2: 34 mEq/L — ABNORMAL HIGH (ref 19–32)
Calcium: 9.5 mg/dL (ref 8.4–10.5)
Chloride: 97 mEq/L (ref 96–112)
Creatinine, Ser: 0.71 mg/dL (ref 0.40–1.20)
GFR: 100.39 mL/min (ref >60.00)
Glucose, Bld: 108 mg/dL — ABNORMAL HIGH (ref 70–99)
Potassium: 3.6 mEq/L (ref 3.5–5.1)
Sodium: 140 mEq/L (ref 135–145)
Total Bilirubin: 0.4 mg/dL (ref 0.2–1.2)
Total Protein: 6.5 g/dL (ref 6.0–8.3)

## 2020-01-21 LAB — CBC
HCT: 40.6 % (ref 36.0–46.0)
Hemoglobin: 14 g/dL (ref 12.0–15.0)
MCHC: 34.6 g/dL (ref 30.0–36.0)
MCV: 92.5 fl (ref 78.0–100.0)
Platelets: 314 10*3/uL (ref 150.0–400.0)
RBC: 4.38 Mil/uL (ref 3.87–5.11)
RDW: 12.4 % (ref 11.5–15.5)
WBC: 7.9 10*3/uL (ref 4.0–10.5)

## 2020-01-21 LAB — TSH: TSH: 0.99 u[IU]/mL (ref 0.35–4.50)

## 2020-01-21 LAB — POCT CBG (FASTING - GLUCOSE)-MANUAL ENTRY: Glucose Fasting, POC: 161 mg/dL — AB (ref 70–99)

## 2020-01-21 LAB — HEMOGLOBIN A1C: Hgb A1c MFr Bld: 5.4 % (ref 4.6–6.5)

## 2020-01-21 MED ORDER — SPIRONOLACTONE 25 MG PO TABS: 25.0000 mg | ORAL_TABLET | Freq: Every day | ORAL | 0 refills | Status: DC

## 2020-01-21 NOTE — Assessment & Plan Note (Signed)
Following with pain management and neurosurgery. Continue current regimen.   Agree with assessment and plan. Pleas Koch, NP

## 2020-01-21 NOTE — Assessment & Plan Note (Addendum)
BP Readings from Last 3 Encounters:  01/21/20 118/64  09/24/19 (!) 141/83  09/23/19 125/73   Blood pressure well controlled in office today and has been managed on hydrochlorothiazide 50mg  once daily. However due to potassium and sodium levels trending downward will discontinue hydrochlorothiazide.   Start spironolactone 25 mg once daily to aid in potassium retention.   CMP pending today and will repeat in 2 weeks to recheck potassium and electrolytes.   She will begin monitoring blood pressure at home.   Agree with assessment and plan. Pleas Koch, NP

## 2020-01-21 NOTE — Assessment & Plan Note (Addendum)
Currently following with psychiatry. Managed on Adderall 20mg  BID & adderall extended release 20mg  once daily, compliant to prescribed regimen.   Continue to follow up with psychiatry.    Agree with assessment and plan. Pleas Koch, NP

## 2020-01-21 NOTE — Progress Notes (Signed)
Subjective:    Patient ID: Tanya [name], female    DOB: [birth year], 50 y.o.   MRN: [id number]  HPI  This visit occurred during the SARS-CoV-2 public health emergency.  Safety protocols were in place, including screening questions prior to the visit, additional usage of staff PPE, and extensive cleaning of exam room while observing appropriate contact time as indicated for disinfecting solutions.    Tanya [name] is a 49 year old female who presents today for complete physical.  She would also like to discuss intermittent right pelvic pain that began about one month ago. Pain is sharp. She has a history of partial hysterectomy with cervix and right ovary remaining. She denies vaginal discharge or itching. She does urinary incontinence during orgasms.   Immunizations: -Tetanus: Completed in 2019 -Influenza: Due today -Pneumonia: Completed in 2020 -Covid-19: Completed series with Booster.  Diet: She endorses a healthy diet. Is intermittent fasting. Exercise: Home exercises 4-5 days weekly.  [name] exam: Completes annually Dental exam: No recent exam  Pap Smear: Completed in 2019, history of hysterectomy  Mammogram: Completed in 2019 Colonoscopy: Never completed Hep C Screen: Due  BP Readings from Last 3 Encounters:  01/21/20 118/64  09/24/19 (!) 141/83  09/23/19 125/73   Wt Readings from Last 3 Encounters:  01/21/20 156 lb (70.8 kg)  09/23/19 159 lb (72.1 kg)  06/21/19 175 lb (79.4 kg)     Review of Systems  Constitutional: Negative for unexpected weight change.  HENT: Negative for rhinorrhea.   Eyes: Negative for visual disturbance.  Respiratory: Negative for cough and shortness of breath.   Cardiovascular: Negative for chest pain.  Gastrointestinal: Negative for constipation and diarrhea.  Genitourinary: Negative for difficulty urinating.       Acute right pelvic pain  Musculoskeletal: Positive for arthralgias and back pain.       Chronic back pain, follows with pain  management and neurosurgery  Skin: Negative for rash.  Allergic/Immunologic: Negative for environmental allergies.  Neurological: Negative for dizziness, numbness and headaches.  Psychiatric/Behavioral:       Follows with psychiatry       Past Medical History:  Diagnosis Date  . ADD (attention deficit disorder)   . Anemia   . Anxiety   . Arthritis   . Asthma   . Blood in stool   . Chickenpox   . Complex regional pain syndrome of right upper extremity   . Depression   . Frequent headaches    migraines  . Heart murmur   . Hypertension   . Hypothyroidism   . IBS (irritable bowel syndrome)   . Insulin resistance   . Lumbar radiculopathy   . Mononucleosis   . PONV (postoperative nausea and vomiting)   . Seasonal allergies   . Shingles   . Spinal stenosis, lumbar   . UTI (urinary tract infection)   . Wears glasses      Social History   Socioeconomic History  . Marital status: Married    Spouse name: Not on file  . Number of children: Not on file  . Years of education: Not on file  . Highest education level: Not on file  Occupational History  . Not on file  Tobacco Use  . Smoking status: Former Smoker    Types: Cigarettes  . Smokeless tobacco: Never Used  . Tobacco comment: quit smoking cirarettes at age 16  Vaping Use  . Vaping Use: Never used  Substance and Sexual Activity  . Alcohol use: Yes  Comment: occasional  . Drug use: Never  . Sexual activity: Yes    Birth control/protection: None  Other Topics Concern  . Not on file  Social History Narrative   Married.   Disabled.   Once worked as an Therapist, sports.   Social Determinants of Health   Financial Resource Strain:   . Difficulty of Paying Living Expenses: Not on file  Food Insecurity:   . Worried About Charity fundraiser in the Last Year: Not on file  . Ran Out of Food in the Last Year: Not on file  Transportation Needs:   . Lack of Transportation (Medical): Not on file  . Lack of Transportation  (Non-Medical): Not on file  Physical Activity:   . Days of Exercise per Week: Not on file  . Minutes of Exercise per Session: Not on file  Stress:   . Feeling of Stress : Not on file  Social Connections:   . Frequency of Communication with Friends and Family: Not on file  . Frequency of Social Gatherings with Friends and Family: Not on file  . Attends Religious Services: Not on file  . Active Member of Clubs or Organizations: Not on file  . Attends Archivist Meetings: Not on file  . Marital Status: Not on file  Intimate Partner Violence:   . Fear of Current or Ex-Partner: Not on file  . Emotionally Abused: Not on file  . Physically Abused: Not on file  . Sexually Abused: Not on file    Past Surgical History:  Procedure Laterality Date  . ABDOMINAL HYSTERECTOMY  2009  . ANTERIOR AND POSTERIOR SPINAL FUSION    . AUGMENTATION MAMMAPLASTY Bilateral    lift done at time of augmentation  . BREAST ENHANCEMENT SURGERY  2008  . CARPAL TUNNEL RELEASE Right 2009  . [place], 2006  . cold cone colonization    . LEEP  2000  . LUMBAR LAMINECTOMY/DECOMPRESSION MICRODISCECTOMY Right 11/21/2018   Procedure: RIGHT LUMBAR THREE THROUGH FOUR, LUMBAR FOUR THROUGH FIVE DECOMPRESSION;  Surgeon: Melina Schools, MD;  Location: [facility];  Service: Orthopedics;  Laterality: Right;  150 mins  . SPINAL CORD STIMULATOR INSERTION  2017  . TUBAL LIGATION    . UTERINE FIBROID SURGERY  2005    Family History  Problem Relation Age of Onset  . Depression Mother   . Learning disabilities Mother   . Depression Father   . Early death Father   . Depression Sister   . Drug abuse Sister   . Early death Sister   . Mental illness Sister   . Miscarriages / Stillbirths Sister   . Multiple sclerosis Sister   . Alcohol abuse Daughter   . Depression Daughter   . Diabetes Daughter   . Learning disabilities Daughter   . Depression Son   . Learning disabilities Son   . Cancer Maternal  Grandmother   . Hearing loss Maternal Grandmother   . Breast cancer Maternal Grandmother   . Alcohol abuse Paternal Grandmother   . Heart disease Paternal Grandmother   . Hyperlipidemia Paternal Grandmother   . Hypertension Paternal Grandmother   . Kidney disease Paternal Grandmother   . ADD / ADHD Paternal Grandfather   . COPD Paternal Grandfather   . Kidney disease Paternal Grandfather   . Hypertension Paternal Grandfather   . Hyperlipidemia Paternal Grandfather   . Depression Son     Allergies  Allergen Reactions  . Hydrocodone-Acetaminophen Hives, Itching and Other (See Comments)       .  Oxycodone-Acetaminophen Hives, Itching and Other (See Comments)       . Pertussis Vaccines Other (See Comments)    unknown    Current Outpatient Medications on File Prior to Visit  Medication Sig Dispense Refill  . Acetylcysteine 600 MG CAPS Take by mouth.    Marland Kitchen albuterol (VENTOLIN HFA) 108 (90 Base) MCG/ACT inhaler Inhale 2 puffs into the lungs every 6 (six) hours as needed for wheezing or shortness of breath. 1 g 1  . ALPRAZolam (XANAX XR) 1 MG 24 hr tablet Take 1 mg by mouth in the morning, at noon, and at bedtime.    Marland Kitchen amphetamine-dextroamphetamine (ADDERALL XR) 20 MG 24 hr capsule Take 20 mg by mouth daily.     Marland Kitchen amphetamine-dextroamphetamine (ADDERALL) 20 MG tablet Take 20 mg by mouth See admin instructions. Take 1 tablet every mroning and take 1 tablet at 1300.    Marland Kitchen buPROPion (WELLBUTRIN XL) 150 MG 24 hr tablet Take 150 mg by mouth See admin instructions. Take 1 tablet in the morning and take 1 tablet at 1300    . buPROPion (WELLBUTRIN XL) 300 MG 24 hr tablet Take 300 mg by mouth daily. NAME BRAND ONLY    . cetirizine (ZYRTEC) 10 MG tablet Take 10 mg by mouth at bedtime.    . clindamycin-benzoyl peroxide (BENZACLIN) gel Apply 1 application topically daily.     . clobetasol ointment (TEMOVATE) 0.05 % Apply to affected areas twice daily 45 g 1  . doxepin (SINEQUAN) 50 MG capsule Take  150 mg by mouth at bedtime.     Marland Kitchen doxycycline (PERIOSTAT) 20 MG tablet Take 1 tablet (20 mg total) by mouth 2 (two) times daily. Take with food 60 tablet 1  . estazolam (PROSOM) 2 MG tablet Take 2 mg by mouth at bedtime.    Marland Kitchen levothyroxine (SYNTHROID) 75 MCG tablet TAKE 1 TABLET EVERY DAY ON EMPTY STOMACHWITH A GLASS OF WATER AT LEAST 30-60 MINBEFORE BREAKFAST 90 tablet 0  . metFORMIN (GLUCOPHAGE) 500 MG tablet Take 500 mg by mouth See admin instructions. Take 1 tablet every morning and take 1 tablet at 1300.    . methocarbamol (ROBAXIN) 500 MG tablet Take 500 mg by mouth 3 (three) times daily.     . mupirocin ointment (BACTROBAN) 2 % Apply to aa's on face QD-BID until healed 22 g 0  . NALTREXONE HCL PO Take 4 mg by mouth daily.    Marland Kitchen orlistat (ALLI) 60 MG capsule Take 120 mg by mouth 3 (three) times daily with meals. Do not exceed 3 capsules daily.    . tiaGABine (GABITRIL) 4 MG tablet Take 16 mg by mouth at bedtime.    Marland Kitchen venlafaxine XR (EFFEXOR-XR) 150 MG 24 hr capsule Take 300 mg by mouth daily with breakfast.     . VICTOZA 18 MG/3ML SOPN Inject 1.8 mg into the skin every evening.      No current facility-administered medications on file prior to visit.    BP 118/64   Pulse 79   Temp 98.4 F (36.9 C) (Temporal)   Ht 5' (1.524 m)   Wt 156 lb (70.8 kg)   LMP  (LMP Unknown)   SpO2 98%   BMI 30.47 kg/m    Objective:   Physical Exam Exam conducted with a chaperone present.  HENT:     Right Ear: Tympanic membrane and ear canal normal.     Left Ear: Tympanic membrane and ear canal normal.  Eyes:     Pupils: Pupils  are equal, round, and reactive to light.  Cardiovascular:     Rate and Rhythm: Normal rate and regular rhythm.  Pulmonary:     Effort: Pulmonary effort is normal.     Breath sounds: Normal breath sounds.  Abdominal:     General: Bowel sounds are normal.     Palpations: Abdomen is soft.     Tenderness: There is no abdominal tenderness.  Genitourinary:    Labia:         Right: No tenderness or lesion.        Left: No tenderness or lesion.      Cervix: No cervical motion tenderness, discharge or cervical bleeding.     Comments: Tenderness to right pelvis upon manual pelvic exam. Musculoskeletal:        General: Normal range of motion.     Cervical back: Neck supple.  Skin:    General: Skin is warm and dry.  Neurological:     Mental Status: She is alert and oriented to person, place, and time.     Cranial Nerves: No cranial nerve deficit.     Deep Tendon Reflexes:     Reflex Scores:      Patellar reflexes are 2+ on the right side and 2+ on the left side. Psychiatric:        Mood and Affect: Mood normal.            Assessment & Plan:

## 2020-01-21 NOTE — Progress Notes (Signed)
Subjective:    Patient ID: Tanya Tanya [name], female    DOB: 02-11-1971, 49 y.o.   MRN: [id number]  HPI  This visit occurred during the SARS-CoV-2 public health emergency.  Safety protocols were in place, including screening questions prior to the visit, additional usage of staff PPE, and extensive cleaning of exam room while observing appropriate contact time as indicated for disinfecting solutions.   Tanya Tanya [name] is a 49 year old female with history of hypertension, asthma, hypothyroidism, complex pain syndrome, depression & ADD who presents today for complete physical.  Immunizations: -Tetanus: UTD, due 2029 -Influenza: Today  -Pneumonia: completed in 2020 -Covid-19: Completed series   Diet: Low carb intermittent fasting    Exercise: Has been doing exercise 4-5 times a week that she learned by physical therapy   Tanya [name] exam: Due, last seen dec 2020 Dental exam: Due, last visit 2019   Pap Smear: UTD, completed 2019: reports history of abnormal request to complete today  Mammogram: Due last completed 2019 Colonoscopy: Never completed Hep C Screen:   1) Hypertension Managed on hydrochlorothiazide 50mg  once daily. Is complaint to this regimen.    BP Readings from Last 3 Encounters:  01/21/20 118/64  09/24/19 (!) 141/83  09/23/19 125/73   Wt Readings from Last 3 Encounters:  01/21/20 156 lb (70.8 kg)  09/23/19 159 lb (72.1 kg)  06/21/19 175 lb (79.4 kg)   2) Asthma Managed on albuterol. Takes daily zyrtec. Uses albuterol inhaler once a week mostly just when doing outside activity.   3)ADD Managed on Adderall 20mg  BID & adderall extended release 20mg  once daily. Follows with psychiatry.    4) Chronic Pain  Sees pain management for this. Currently managed on Naltrexone 4mg  daily.   5) Depression Managed on Wellbutrin & Effexor 300mg  once daily managed by psychiatry. Feels well controlled on this medication.   6) Prediabetes  Managed on metformin 500mg  BID. Last A1c of  5.4  Recently removed from [place] by her endocrinologist however she is still taking this to help with weight loss.    Review of Systems  Constitutional: Negative.   Respiratory: Negative.  Negative for chest tightness and shortness of breath.   Cardiovascular: Negative.  Negative for chest pain, palpitations and leg swelling.  Gastrointestinal: Positive for abdominal pain. Negative for constipation, diarrhea, nausea and vomiting.  Musculoskeletal: Negative.   Skin: Negative.   Neurological: Positive for headaches. Negative for dizziness, weakness and numbness.  Psychiatric/Behavioral: Negative.  Negative for suicidal ideas.       Objective:   Physical Exam Exam conducted with a chaperone present.  Constitutional:      Appearance: Normal appearance.  HENT:     Right Ear: Tympanic membrane normal.     Left Ear: Tympanic membrane normal.     Nose: Nose normal.  Eyes:     Pupils: Pupils are equal, round, and reactive to light.  Cardiovascular:     Rate and Rhythm: Normal rate and regular rhythm.     Pulses: Normal pulses.     Heart sounds: Normal heart sounds.  Pulmonary:     Effort: Pulmonary effort is normal.     Breath sounds: Normal breath sounds.  Abdominal:     General: Bowel sounds are normal.     Palpations: Abdomen is soft.     Tenderness: There is abdominal tenderness.     Comments: Tenderness upon palpation of right ovary.   Genitourinary:    General: Normal vulva.     Pubic Area:  No rash.      Vagina: Normal. No vaginal discharge, tenderness or bleeding.     Cervix: No discharge, lesion or erythema.     Uterus: Absent.      Rectum: Normal.  Musculoskeletal:        General: Normal range of motion.     Cervical back: Normal range of motion and neck supple.  Skin:    General: Skin is warm and dry.     Capillary Refill: Capillary refill takes less than 2 seconds.  Neurological:     General: No focal deficit present.     Mental Status: She is alert and  oriented to person, place, and time.  Psychiatric:        Mood and Affect: Mood normal.        Behavior: Behavior normal.           Assessment & Plan:

## 2020-01-21 NOTE — Assessment & Plan Note (Addendum)
Immunizations UTD Influenza updated today.  Pap smear completed today Mammogram: due referral placed  Colonoscopy: never completed referral placed Exam today: overall unremarkable, acute right pelvic pain -[place] order , UA completed.  Labs pending Will follow up for CPE in one year.      Agree with assessment and plan. Pleas Koch, NP

## 2020-01-21 NOTE — Assessment & Plan Note (Addendum)
Chronic pain, followed by pain management as well as neurosurgery. No acute issues, pain has been well controlled on current medication regimen.     Agree with assessment and plan. Pleas Koch, NP

## 2020-01-21 NOTE — Assessment & Plan Note (Addendum)
Followed by psychiatry managed on Wellbutrin 150mg  BID & Effexor 300mg  once daily, compliant to prescribed regimen. Feels well controlled.    Continue to follow up with psychiatry.    Agree with assessment and plan. Pleas Koch, NP

## 2020-01-21 NOTE — Assessment & Plan Note (Addendum)
Last TSH 0.69. Continue levothyroxine 58mcg. TSH pending.  Following with endocrinology.   Agree with assessment and plan. Pleas Koch, NP

## 2020-01-21 NOTE — Assessment & Plan Note (Addendum)
Right sided pelvic pain, Acute and intermittent begin 1 month ago. History of endometriosis with partial hysterectomy, right ovary & cervix.    Exam today with moderate tenderness during pelvic exam. No discharge or other vaginal symptoms.   Pap smear completed today per patient request. Last pap smear was in 2019 and negative.  UA negative in clinic. Culture sent. Pelvic US pending   Agree with assessment and plan. Pleas Koch, NP

## 2020-01-21 NOTE — Assessment & Plan Note (Signed)
Right ovary remains. Following with endocrinology who is prescribing Metformin.  Repeat A1c pending. Pelvic/transvaginal ultrasound pending due to right pelvic pain.   Agree with assessment and plan. Pleas Koch, NP

## 2020-01-21 NOTE — Assessment & Plan Note (Signed)
Follows with endocrinology who manages her on Metformin.  She will be finishing up her Victoza and has been removed from this prescription.  Repeat A1c pending. Fasting glucose today of 161.

## 2020-01-21 NOTE — Assessment & Plan Note (Addendum)
Well controlled. Currently uses albuterol only once a week.  Continue daily Zyrtec, avoid triggers.   Agree with assessment and plan. Pleas Koch, NP

## 2020-01-21 NOTE — Patient Instructions (Addendum)
Stop by the lab prior to leaving today. I will notify you of your results once received.   You will be contacted regarding your referral to complete colonoscopy. Please let us know if you have not been contacted within two weeks. The order is in for your mammogram, Please contact [facility] to schedule this.   Please stop taking hydrochlorothiazide and begin taking spironolactone (ALDACTONE) 25 mg once a day.    Begin to monitor your blood pressure and keep log of this, schedule a lab in 2 weeks for a lab only appointment.     Influenza (Flu) Vaccine (Inactivated or Recombinant): What You Need to Know 1. Why get vaccinated? Influenza vaccine can prevent influenza (flu). Flu is a contagious disease that spreads around the [place] every year, usually between October and May. Anyone can get the flu, but it is more dangerous for some people. Infants and young children, people 52 years of age and older, pregnant women, and people with certain health conditions or a weakened immune system are at greatest risk of flu complications. Pneumonia, bronchitis, sinus infections and ear infections are examples of flu-related complications. If you have a medical condition, such as heart disease, cancer or diabetes, flu can make it worse. Flu can cause fever and chills, sore throat, muscle aches, fatigue, cough, headache, and runny or stuffy nose. Some people may have vomiting and diarrhea, though this is more common in children than adults. Each year thousands of people in the [place] die from flu, and many more are hospitalized. Flu vaccine prevents millions of illnesses and flu-related visits to the doctor each year. 2. Influenza vaccine CDC recommends everyone 16 months of age and older get vaccinated every flu season. Children 6 months through 83 years of age may need 2 doses during a single flu season. Everyone else needs only 1 dose each flu season. It takes about 2 weeks for protection to develop  after vaccination. There are many flu viruses, and they are always changing. Each year a new flu vaccine is made to protect against three or four viruses that are likely to cause disease in the upcoming flu season. Even when the vaccine doesn't exactly match these viruses, it may still provide some protection. Influenza vaccine does not cause flu. Influenza vaccine may be given at the same time as other vaccines. 3. Talk with your health care provider Tell your vaccine provider if the person getting the vaccine:  Has had an allergic reaction after a previous dose of influenza vaccine, or has any severe, life-threatening allergies.  Has ever had Guillain-Barr Syndrome (also called GBS). In some cases, your health care provider may decide to postpone influenza vaccination to a future visit. People with minor illnesses, such as a cold, may be vaccinated. People who are moderately or severely ill should usually wait until they recover before getting influenza vaccine. Your health care provider can give you more information. 4. Risks of a vaccine reaction  Soreness, redness, and swelling where shot is given, fever, muscle aches, and headache can happen after influenza vaccine.  There may be a very small increased risk of Guillain-Barr Syndrome (GBS) after inactivated influenza vaccine (the flu shot). Young children who get the flu shot along with pneumococcal vaccine (PCV13), and/or DTaP vaccine at the same time might be slightly more likely to have a seizure caused by fever. Tell your health care provider if a child who is getting flu vaccine has ever had a seizure. People sometimes faint after medical  procedures, including vaccination. Tell your provider if you feel dizzy or have vision changes or ringing in the ears. As with any medicine, there is a very remote chance of a vaccine causing a severe allergic reaction, other serious injury, or death. 5. What if there is a serious problem? An  allergic reaction could occur after the vaccinated person leaves the clinic. If you see signs of a severe allergic reaction (hives, swelling of the face and throat, difficulty breathing, a fast heartbeat, dizziness, or weakness), call 9-1-1 and get the person to the nearest hospital. For other signs that concern you, call your health care provider. Adverse reactions should be reported to the Vaccine Adverse Event Reporting System (VAERS). Your health care provider will usually file this report, or you can do it yourself. Visit the VAERS website at www.vaers.SamedayNews.es or call 956 622 1630.VAERS is only for reporting reactions, and VAERS staff do not give medical advice. 6. The National Vaccine Injury Compensation Program The [employer] Vaccine Injury Compensation Program (VICP) is a federal program that was created to compensate people who may have been injured by certain vaccines. Visit the VICP website at GoldCloset.com.ee or call (307)046-3292 to learn about the program and about filing a claim. There is a time limit to file a claim for compensation. 7. How can I learn more?  Ask your healthcare provider.  Call your local or state health department.  Contact the Centers for Disease Control and Prevention (CDC): ? Call 279-325-9294 (1-800-CDC-INFO) or ? Visit CDC's https://gibson.com/ Vaccine Information Statement (Interim) Inactivated Influenza Vaccine (10/26/2017) This information is not intended to replace advice given to you by your health care provider. Make sure you discuss any questions you have with your health care provider. Document Revised: 06/19/2018 Document Reviewed: 10/30/2017 Elsevier Patient Education  [place].

## 2020-01-21 NOTE — Assessment & Plan Note (Signed)
Following with psychiatry, feels well managed.

## 2020-01-22 LAB — URINE CULTURE
MICRO NUMBER:: 11179744
SPECIMEN QUALITY:: ADEQUATE

## 2020-01-22 LAB — HEPATITIS C ANTIBODY
Hepatitis C Ab: NONREACTIVE
SIGNAL TO CUT-OFF: 0 (ref <1.00)

## 2020-01-23 ENCOUNTER — Telehealth: Payer: Self-pay

## 2020-01-23 LAB — CYTOLOGY - PAP
Comment: NEGATIVE
Diagnosis: NEGATIVE
High risk HPV: NEGATIVE

## 2020-01-23 NOTE — Telephone Encounter (Signed)
If she's in that much pain then we need to move up the ultrasound date. Does she think that she can endure the ultrasound on 11/12 or 11/15? I see that she is scheduled for 11/16.

## 2020-01-23 NOTE — Telephone Encounter (Signed)
Spoke to patient states pain has been constant from time of exam. Has U/S scheduled next week. Reviewed red words that she will need to go to ED. She has repeated back to me.

## 2020-01-23 NOTE — Telephone Encounter (Signed)
Pt called and had exam on 01/21/20; since pelvic exam the dull rt sided pain no longer comes and goes but is a constant sharp pain on rt side. No bleeding or vaginal discharge today. Pt is walking slow and using heating pad. Pt said pain level now is at least a 7. Pt wants Gentry Fitz NP to know and see what Gentry Fitz NP suggest.

## 2020-01-24 ENCOUNTER — Other Ambulatory Visit: Payer: Self-pay

## 2020-01-24 ENCOUNTER — Telehealth: Payer: Self-pay

## 2020-01-24 ENCOUNTER — Ambulatory Visit
Admission: RE | Admit: 2020-01-24 | Discharge: 2020-01-24 | Disposition: A | Discharge status: Home / Self Care | Payer: PPO | Source: Ambulatory Visit | Attending: Primary Care | Admitting: Primary Care

## 2020-01-24 ENCOUNTER — Other Ambulatory Visit: Payer: Self-pay | Admitting: Primary Care

## 2020-01-24 DIAGNOSIS — Z1231 Encounter for screening mammogram for malignant neoplasm of breast: Secondary | ICD-10-CM

## 2020-01-24 DIAGNOSIS — R1031 Right lower quadrant pain: Secondary | ICD-10-CM | POA: Diagnosis not present

## 2020-01-24 DIAGNOSIS — R102 Pelvic and perineal pain: Secondary | ICD-10-CM | POA: Diagnosis not present

## 2020-01-24 DIAGNOSIS — E282 Polycystic ovarian syndrome: Secondary | ICD-10-CM

## 2020-01-24 NOTE — Telephone Encounter (Signed)
Noted, reviewed.  See result note.

## 2020-01-24 NOTE — Telephone Encounter (Signed)
Anderson Malta with [facility] called report US pelvis complete; report is in epic and taking copy of report to Gentry Fitz NP.

## 2020-01-24 NOTE — Telephone Encounter (Signed)
Got u/s moved up have tried to call patient to let know l/m to call office.

## 2020-01-24 NOTE — Telephone Encounter (Signed)
Called patient gave appointment information and address will arrive with full bladder at 1:30.

## 2020-01-28 ENCOUNTER — Ambulatory Visit: Payer: PPO

## 2020-01-29 ENCOUNTER — Telehealth: Payer: PPO

## 2020-01-30 ENCOUNTER — Telehealth: Payer: PPO

## 2020-01-30 DIAGNOSIS — Z5181 Encounter for therapeutic drug level monitoring: Secondary | ICD-10-CM | POA: Diagnosis not present

## 2020-01-30 DIAGNOSIS — M25551 Pain in right hip: Secondary | ICD-10-CM | POA: Diagnosis not present

## 2020-01-30 DIAGNOSIS — G894 Chronic pain syndrome: Secondary | ICD-10-CM | POA: Diagnosis not present

## 2020-01-30 DIAGNOSIS — Z79899 Other long term (current) drug therapy: Secondary | ICD-10-CM | POA: Diagnosis not present

## 2020-01-30 DIAGNOSIS — G90519 Complex regional pain syndrome I of unspecified upper limb: Secondary | ICD-10-CM | POA: Diagnosis not present

## 2020-01-30 DIAGNOSIS — Z9689 Presence of other specified functional implants: Secondary | ICD-10-CM | POA: Diagnosis not present

## 2020-01-30 DIAGNOSIS — M25552 Pain in left hip: Secondary | ICD-10-CM | POA: Diagnosis not present

## 2020-01-30 DIAGNOSIS — M5417 Radiculopathy, lumbosacral region: Secondary | ICD-10-CM | POA: Diagnosis not present

## 2020-01-30 DIAGNOSIS — G8929 Other chronic pain: Secondary | ICD-10-CM | POA: Diagnosis not present

## 2020-02-03 ENCOUNTER — Encounter: Payer: Self-pay | Admitting: Obstetrics & Gynecology

## 2020-02-03 ENCOUNTER — Other Ambulatory Visit: Payer: Self-pay

## 2020-02-03 ENCOUNTER — Ambulatory Visit (INDEPENDENT_AMBULATORY_CARE_PROVIDER_SITE_OTHER): Payer: PPO | Admitting: Obstetrics & Gynecology

## 2020-02-03 ENCOUNTER — Telehealth: Payer: Self-pay | Admitting: Obstetrics & Gynecology

## 2020-02-03 VITALS — BP 140/90 | Ht 61.5 in | Wt 160.0 lb

## 2020-02-03 DIAGNOSIS — N83202 Unspecified ovarian cyst, left side: Secondary | ICD-10-CM | POA: Diagnosis not present

## 2020-02-03 DIAGNOSIS — N83201 Unspecified ovarian cyst, right side: Secondary | ICD-10-CM | POA: Insufficient documentation

## 2020-02-03 DIAGNOSIS — R1032 Left lower quadrant pain: Secondary | ICD-10-CM | POA: Insufficient documentation

## 2020-02-03 DIAGNOSIS — R1031 Right lower quadrant pain: Secondary | ICD-10-CM | POA: Insufficient documentation

## 2020-02-03 HISTORY — DX: Unspecified ovarian cyst, right side: N83.201

## 2020-02-03 HISTORY — DX: Unspecified ovarian cyst, left side: N83.202

## 2020-02-03 NOTE — H&P (View-Only) (Signed)
HPI: Patient is a 49 y.o. G1P1 who LMP was No LMP recorded (lmp unknown). Patient has had a hysterectomy., presents today for a problem visit.  She complains of recent findings of Right ovarion cyst by Ultrasound - Pelvic Vaginal.  Pt has had symptoms of pain.  She first noted an increased awareness of RLQ and RIGHT SIDED pain 2-3 weeks ago (has chronic pain, frequent pains in all parts at times), and this was worsened with exam by PCP and by TVUS.  It radiates to right flank.  Associated w bloating and small weight gain (2 lbs over the last month).  No GI or GU changes.    Prior Hyst 2009 for endometriosis, also had left ovary removed.  Has had menopausal sx's come and go since that time, at ease with those sx's now.  Denies bleeding, discharge, fever.  [place]- 2 cysts characterized as simple on recent US on right ovary, 5 cm and 4 cm in sizes. "Simple cysts of RIGHT ovary measuring 5.1 cm and 4.4 cm in greatest sizes; Consider GYN consult and followup US in 3-6 months, or pelvis MRI w/o and w/ contrast for improved characterization. Note: This recommendation does not apply to premenarchal patients or to those with increased risk (genetic, family history, elevated tumor markers or other high-risk factors) of ovarian cancer. "  PMHx: She  has a past medical history of ADD (attention deficit disorder), Anemia, Anxiety, Arthritis, Asthma, Blood in stool, Chickenpox, Complex regional pain syndrome of right upper extremity, Depression, Frequent headaches, Heart murmur, Hypertension, Hypothyroidism, IBS (irritable bowel syndrome), Insulin resistance, Lumbar radiculopathy, Mononucleosis, PONV (postoperative nausea and vomiting), Seasonal allergies, Shingles, Spinal stenosis, lumbar, UTI (urinary tract infection), and Wears glasses. Also,  has a past surgical history that includes Carpal tunnel release (Right, 2009); LEEP (2000); Breast enhancement surgery (2008); Uterine fibroid surgery (2005); Cesarean  section (1993, 1997, 2006); Abdominal hysterectomy (2009); Anterior and posterior spinal fusion; Spinal cord stimulator insertion (2017); Augmentation mammaplasty (Bilateral); cold cone colonization; Tubal ligation; and Lumbar laminectomy/decompression microdiscectomy (Right, 11/21/2018)., family history includes ADD / ADHD in her paternal grandfather; Alcohol abuse in her daughter and paternal grandmother; Breast cancer in her maternal grandmother; COPD in her paternal grandfather; Cancer in her maternal grandmother; Depression in her daughter, father, mother, sister, son, and son; Diabetes in her daughter; Drug abuse in her sister; Early death in her father and sister; Hearing loss in her maternal grandmother; Heart disease in her paternal grandmother; Hyperlipidemia in her paternal grandfather and paternal grandmother; Hypertension in her paternal grandfather and paternal grandmother; Kidney disease in her paternal grandfather and paternal grandmother; Learning disabilities in her daughter, mother, and son; Mental illness in her sister; Miscarriages / Stillbirths in her sister; Multiple sclerosis in her sister.,  reports that she has quit smoking. Her smoking use included cigarettes. She has never used smokeless tobacco. She reports current alcohol use. She reports that she does not use drugs.  She has a current medication list which includes the following prescription(s): acetylcysteine, albuterol, alprazolam, amphetamine-dextroamphetamine, amphetamine-dextroamphetamine, bupropion, bupropion, cetirizine, clindamycin-benzoyl peroxide, clobetasol ointment, doxepin, doxycycline, estazolam, levothyroxine, metformin, methocarbamol, mupirocin ointment, naltrexone hcl, orlistat, spironolactone, tiagabine, venlafaxine xr, and victoza. Also, is allergic to hydrocodone-acetaminophen, oxycodone-acetaminophen, and pertussis vaccines.  Review of Systems  Constitutional: Positive for malaise/fatigue. Negative for chills  and fever.  HENT: Negative for congestion, sinus pain and sore throat.   Eyes: Negative for blurred vision and pain.  Respiratory: Negative for cough and wheezing.   Cardiovascular: Negative for chest pain and  leg swelling.  Gastrointestinal: Positive for abdominal pain. Negative for constipation, diarrhea, heartburn, nausea and vomiting.  Genitourinary: Positive for frequency. Negative for dysuria, hematuria and urgency.  Musculoskeletal: Positive for joint pain. Negative for back pain, myalgias and neck pain.  Skin: Negative for itching and rash.  Neurological: Negative for dizziness, tremors and weakness.  Endo/Heme/Allergies: Does not bruise/bleed easily.  Psychiatric/Behavioral: Positive for depression. The patient is nervous/anxious. The patient does not have insomnia.     Objective: BP 140/90   Ht 5' 1.5" (1.562 m)   Wt 160 lb (72.6 kg)   LMP  (LMP Unknown)   BMI 29.74 kg/m  Physical Exam Constitutional:      General: She is not in acute distress.    Appearance: She is well-developed.  Abdominal:     Palpations: Abdomen is soft.     Tenderness: There is abdominal tenderness in the right lower quadrant. There is no guarding or rebound.     Comments: RLQ T, mild- mod, worse w deep palpation  Musculoskeletal:        General: Normal range of motion.  Neurological:     Mental Status: She is alert and oriented to person, place, and time.  Skin:    General: Skin is warm and dry.  Vitals reviewed.     ASSESSMENT/PLAN:    Problem List Items Addressed This Visit    Ovarian cyst, right   Right lower quadrant pain    CA125.  Risks of cancer low based on characteristics by [place]. Laparoscopy oophorectomy discussed based on degree of pain and size of cysts.  Pros and cons.  Menopause discussed (already has gone thru and finished sx's of menopause).  Risks of cysts, adhesions, cancer al counseled today.  I have had a careful discussion with this patient about all the options  available and the risk/benefits of each. I have fully informed this patient that surgery may subject her to a variety of discomforts and risks: She understands that most patients have surgery with little difficulty, but problems can happen ranging from minor to fatal. These include nausea, vomiting, pain, bleeding, infection, poor healing, hernia, or formation of adhesions. Unexpected reactions may occur from any drug or anesthetic given. Unintended injury may occur to other pelvic or abdominal structures such as Fallopian tubes, ovaries, bladder, ureter (tube from kidney to bladder), or bowel. Nerves going from the pelvis to the legs may be injured. Any such injury may require immediate or later additional surgery to correct the problem. Excessive blood loss requiring transfusion is very unlikely but possible. Dangerous blood clots may form in the legs or lungs. Physical and sexual activity will be restricted in varying degrees for an indeterminate period of time but most often 2-6 weeks.  Finally, she understands that it is impossible to list every possible undesirable effect and that the condition for which surgery is done is not always cured or significantly improved, and in rare cases may be even worse.Ample time was given to answer all questions.   Barnett Applebaum, MD, [facility], [facility] 02/03/2020  2:27 PM

## 2020-02-03 NOTE — Patient Instructions (Signed)
Unilateral Salpingo-Oophorectomy, Care After This sheet gives you information about how to care for yourself after your procedure. Your health care provider may also give you more specific instructions. If you have problems or questions, contact your health care provider. What can I expect after the procedure? After the procedure, it is common to have:  Abdominal pain.  Some occasional vaginal bleeding (spotting).  Tiredness. Follow these instructions at home: Incision care   Keep your incision area and your bandage (dressing) clean and dry.  Follow instructions from your health care provider about how to take care of your incision. Make sure you: ? Wash your hands with soap and water before you change your dressing. If soap and water are not available, use hand sanitizer. ? Change your dressing as told by your health care provider. ? Leave stitches (sutures), staples, skin glue, or adhesive strips in place. These skin closures may need to stay in place for 2 weeks or longer. If adhesive strip edges start to loosen and curl up, you may trim the loose edges. Do not remove adhesive strips completely unless your health care provider tells you to do that.  Check your incision area every day for signs of infection. Check for: ? Redness, swelling, or pain. ? Fluid or blood. ? Warmth. ? Pus or a bad smell. Activity  Do not drive or use heavy machinery while taking prescription pain medicine.  Do not drive for 24 hours if you received a medicine to help you relax (sedative).  Take frequent, short walks throughout the day. Rest when you get tired. Ask your health care provider what activities are safe for you.  Avoid activities that require great effort. Also, avoid heavy lifting. Do not lift anything that is heavier than 5 lb (2.3 kg), or the limit that your health care provider tells you, until he or she says that it is safe to do so.  Do not douche, use tampons, or have sex until your  health care provider approves. General instructions  To prevent or treat constipation while you are taking prescription pain medicine, your health care provider may recommend that you: ? Drink enough fluid to keep your urine pale yellow. ? Take over-the-counter or prescription medicines. ? Eat foods that are high in fiber, such as fresh fruits and vegetables, whole grains, and beans. ? Limit foods that are high in fat and processed sugars, such as fried and sweet foods.  Take over-the-counter and prescription medicines only as told by your health care provider.  Do not take baths, swim, or use a hot tub until your health care provider approves. Ask your health care provider if you may take showers. You may only be allowed to take sponge baths.  Wear compression stockings as told by your health care provider. These stockings help to prevent blood clots and reduce swelling in your legs.  Keep all follow-up visits as told by your health care provider. This is important. Contact a health care provider if:  You have pain when you urinate.  You have pus or a bad smelling discharge coming from your vagina.  You have redness, swelling, or pain around your incision.  You have fluid or blood coming from your incision.  Your incision feels warm to the touch.  You have pus or a bad smell coming from your incision.  You have a fever.  Your incision starts to break open.  You have abdominal pain that gets worse or does not get better with medicine.  You develop a rash.  You develop nausea and vomiting.  You feel lightheaded. Get help right away if:  You develop pain in your chest or leg.  You develop shortness of breath.  You faint.  You have increased bleeding from your vagina. Summary  After the procedure, it is common to have pain, tiredness, and occasional bleeding from the vagina.  Follow instructions from your health care provider about how to take care of your  incision.  Check your incision every day for signs of infection and report any symptoms to your health care provider.  Follow instructions from your health care provider about activities and restrictions. This information is not intended to replace advice given to you by your health care provider. Make sure you discuss any questions you have with your health care provider. Document Revised: 02/10/2017 Document Reviewed: 06/09/2016 Elsevier Patient Education  [employer].

## 2020-02-03 NOTE — Progress Notes (Signed)
HPI: Patient is a 49 y.o. G1P1 who LMP was No LMP recorded (lmp unknown). Patient has had a hysterectomy., presents today for a problem visit.  She complains of recent findings of Right ovarion cyst by Ultrasound - Pelvic Vaginal.  Pt has had symptoms of pain.  She first noted an increased awareness of RLQ and RIGHT SIDED pain 2-3 weeks ago (has chronic pain, frequent pains in all parts at times), and this was worsened with exam by PCP and by TVUS.  It radiates to right flank.  Associated w bloating and small weight gain (2 lbs over the last month).  No GI or GU changes.    Prior Hyst 2009 for endometriosis, also had left ovary removed.  Has had menopausal sx's come and go since that time, at ease with those sx's now.  Denies bleeding, discharge, fever.  [place]- 2 cysts characterized as simple on recent US on right ovary, 5 cm and 4 cm in sizes. "Simple cysts of RIGHT ovary measuring 5.1 cm and 4.4 cm in greatest sizes; Consider GYN consult and followup US in 3-6 months, or pelvis MRI w/o and w/ contrast for improved characterization. Note: This recommendation does not apply to premenarchal patients or to those with increased risk (genetic, family history, elevated tumor markers or other high-risk factors) of ovarian cancer. "  PMHx: She  has a past medical history of ADD (attention deficit disorder), Anemia, Anxiety, Arthritis, Asthma, Blood in stool, Chickenpox, Complex regional pain syndrome of right upper extremity, Depression, Frequent headaches, Heart murmur, Hypertension, Hypothyroidism, IBS (irritable bowel syndrome), Insulin resistance, Lumbar radiculopathy, Mononucleosis, PONV (postoperative nausea and vomiting), Seasonal allergies, Shingles, Spinal stenosis, lumbar, UTI (urinary tract infection), and Wears glasses. Also,  has a past surgical history that includes Carpal tunnel release (Right, 2009); LEEP (2000); Breast enhancement surgery (2008); Uterine fibroid surgery (2005); Cesarean  section (1993, 1997, 2006); Abdominal hysterectomy (2009); Anterior and posterior spinal fusion; Spinal cord stimulator insertion (2017); Augmentation mammaplasty (Bilateral); cold cone colonization; Tubal ligation; and Lumbar laminectomy/decompression microdiscectomy (Right, 11/21/2018)., family history includes ADD / ADHD in her paternal grandfather; Alcohol abuse in her daughter and paternal grandmother; Breast cancer in her maternal grandmother; COPD in her paternal grandfather; Cancer in her maternal grandmother; Depression in her daughter, father, mother, sister, son, and son; Diabetes in her daughter; Drug abuse in her sister; Early death in her father and sister; Hearing loss in her maternal grandmother; Heart disease in her paternal grandmother; Hyperlipidemia in her paternal grandfather and paternal grandmother; Hypertension in her paternal grandfather and paternal grandmother; Kidney disease in her paternal grandfather and paternal grandmother; Learning disabilities in her daughter, mother, and son; Mental illness in her sister; Miscarriages / Stillbirths in her sister; Multiple sclerosis in her sister.,  reports that she has quit smoking. Her smoking use included cigarettes. She has never used smokeless tobacco. She reports current alcohol use. She reports that she does not use drugs.  She has a current medication list which includes the following prescription(s): acetylcysteine, albuterol, alprazolam, amphetamine-dextroamphetamine, amphetamine-dextroamphetamine, bupropion, bupropion, cetirizine, clindamycin-benzoyl peroxide, clobetasol ointment, doxepin, doxycycline, estazolam, levothyroxine, metformin, methocarbamol, mupirocin ointment, naltrexone hcl, orlistat, spironolactone, tiagabine, venlafaxine xr, and victoza. Also, is allergic to hydrocodone-acetaminophen, oxycodone-acetaminophen, and pertussis vaccines.  Review of Systems  Constitutional: Positive for malaise/fatigue. Negative for chills  and fever.  HENT: Negative for congestion, sinus pain and sore throat.   Eyes: Negative for blurred vision and pain.  Respiratory: Negative for cough and wheezing.   Cardiovascular: Negative for chest pain and  leg swelling.  Gastrointestinal: Positive for abdominal pain. Negative for constipation, diarrhea, heartburn, nausea and vomiting.  Genitourinary: Positive for frequency. Negative for dysuria, hematuria and urgency.  Musculoskeletal: Positive for joint pain. Negative for back pain, myalgias and neck pain.  Skin: Negative for itching and rash.  Neurological: Negative for dizziness, tremors and weakness.  Endo/Heme/Allergies: Does not bruise/bleed easily.  Psychiatric/Behavioral: Positive for depression. The patient is nervous/anxious. The patient does not have insomnia.     Objective: BP 140/90   Ht 5' 1.5" (1.562 m)   Wt 160 lb (72.6 kg)   LMP  (LMP Unknown)   BMI 29.74 kg/m  Physical Exam Constitutional:      General: She is not in acute distress.    Appearance: She is well-developed.  Abdominal:     Palpations: Abdomen is soft.     Tenderness: There is abdominal tenderness in the right lower quadrant. There is no guarding or rebound.     Comments: RLQ T, mild- mod, worse w deep palpation  Musculoskeletal:        General: Normal range of motion.  Neurological:     Mental Status: She is alert and oriented to person, place, and time.  Skin:    General: Skin is warm and dry.  Vitals reviewed.     ASSESSMENT/PLAN:    Problem List Items Addressed This Visit    Ovarian cyst, right   Right lower quadrant pain    CA125.  Risks of cancer low based on characteristics by [place]. Laparoscopy oophorectomy discussed based on degree of pain and size of cysts.  Pros and cons.  Menopause discussed (already has gone thru and finished sx's of menopause).  Risks of cysts, adhesions, cancer al counseled today.  I have had a careful discussion with this patient about all the options  available and the risk/benefits of each. I have fully informed this patient that surgery may subject her to a variety of discomforts and risks: She understands that most patients have surgery with little difficulty, but problems can happen ranging from minor to fatal. These include nausea, vomiting, pain, bleeding, infection, poor healing, hernia, or formation of adhesions. Unexpected reactions may occur from any drug or anesthetic given. Unintended injury may occur to other pelvic or abdominal structures such as Fallopian tubes, ovaries, bladder, ureter (tube from kidney to bladder), or bowel. Nerves going from the pelvis to the legs may be injured. Any such injury may require immediate or later additional surgery to correct the problem. Excessive blood loss requiring transfusion is very unlikely but possible. Dangerous blood clots may form in the legs or lungs. Physical and sexual activity will be restricted in varying degrees for an indeterminate period of time but most often 2-6 weeks.  Finally, she understands that it is impossible to list every possible undesirable effect and that the condition for which surgery is done is not always cured or significantly improved, and in rare cases may be even worse.Ample time was given to answer all questions.   Barnett Applebaum, MD, [facility], [facility] 02/03/2020  2:27 PM

## 2020-02-03 NOTE — Telephone Encounter (Signed)
Left a message for the patient to return the call.  

## 2020-02-03 NOTE — Telephone Encounter (Signed)
This has been taken care of. I called and let patient know.

## 2020-02-04 ENCOUNTER — Other Ambulatory Visit: Payer: PPO

## 2020-02-04 LAB — PREMENOPAUSAL INTERP: LOW

## 2020-02-04 LAB — OVARIAN MALIGNANCY RISK-ROMA
Cancer Antigen (CA) 125: 8.8 U/mL (ref 0.0–38.1)
HE4: 51.1 pmol/L (ref 0.0–63.6)
Postmenopausal ROMA: 0.83
Premenopausal ROMA: 0.76

## 2020-02-04 LAB — POSTMENOPAUSAL INTERP: LOW

## 2020-02-10 ENCOUNTER — Telehealth: Payer: Self-pay | Admitting: Obstetrics & Gynecology

## 2020-02-10 NOTE — Telephone Encounter (Signed)
patient rtn'd call to go over upcoming surgery Laparoscopy right oophoreectomy w Kaylyn Layer 02/13/20  H&P N/A    Covid testing 12/1 @ 8-10:30, [address], drive up and wear mask. Advised pt to quarantine until DOS.  Pre-admit phone call 11/30 @ 1-5pm  Advised that pt may also receive calls from the hospital pharmacy and pre-service center.  Confirmed pt has [employer] as primary insurance. No secondary insurance.

## 2020-02-11 ENCOUNTER — Encounter
Admission: RE | Admit: 2020-02-11 | Discharge: 2020-02-11 | Disposition: A | Discharge status: Home / Self Care | Payer: PPO | Source: Ambulatory Visit | Attending: Obstetrics & Gynecology | Admitting: Obstetrics & Gynecology

## 2020-02-11 ENCOUNTER — Other Ambulatory Visit: Payer: Self-pay

## 2020-02-11 HISTORY — DX: Complex regional pain syndrome I, unspecified: G90.50

## 2020-02-11 HISTORY — DX: Pneumonia, unspecified organism: J18.9

## 2020-02-11 NOTE — Patient Instructions (Addendum)
Your procedure is scheduled on: Thursday, December 2 Report to the Registration Desk on the 1st floor of the [facility]. To find out your arrival time, please call [phone] between 1PM - 3PM on: Wednesday, December 1  REMEMBER: Instructions that are not followed completely may result in serious medical risk, up to and including death; or upon the discretion of your surgeon and anesthesiologist your surgery may need to be rescheduled.  Do not eat food after midnight the night before surgery.  No gum chewing, lozengers or hard candies.  You may however, drink water up to 2 hours before you are scheduled to arrive for your surgery. Do not drink anything within 2 hours of your scheduled arrival time.  TAKE THESE MEDICATIONS THE MORNING OF SURGERY WITH A SIP OF WATER: 1.  Albuterol inhaler 2.  Alprazolam 3.  Wellbutrin 4.  Levothyroxine 5.  Venlafaxine  Use inhalers on the day of surgery and bring to the hospital.  Stop Metformin 2 days prior to surgery. Last day to take is November 29; resume after surgery.  Do not take any insulin on the day of surgery.  One week prior to surgery: Stop Anti-inflammatories (NSAIDS) such as Advil, Aleve, Ibuprofen, Motrin, Naproxen, Naprosyn and Aspirin based products such as Excedrin, Goodys Powder, BC Powder. Stop ANY OVER THE COUNTER supplements until after surgery. (However, you may continue taking Vitamin B, and multivitamin up until the day before surgery.)  No Alcohol for 24 hours before or after surgery.  No Smoking including e-cigarettes for 24 hours prior to surgery.  No chewable tobacco products for at least 6 hours prior to surgery.  No nicotine patches on the day of surgery.  Do not use any "recreational" drugs for at least a week prior to your surgery.  Please be advised that the combination of cocaine and anesthesia may have negative outcomes, up to and including death. If you test positive for cocaine, your surgery will be  cancelled.  On the morning of surgery brush your teeth with toothpaste and water, you may rinse your mouth with mouthwash if you wish. Do not swallow any toothpaste or mouthwash.  Do not wear jewelry, make-up, hairpins, clips or nail polish.  Do not wear lotions, powders, or perfumes.   Do not shave body from the neck down 48 hours prior to surgery just in case you cut yourself which could leave a site for infection.  Also, freshly shaved skin may become irritated if using the CHG soap.  Do not bring valuables to the hospital. [facility] is not responsible for any missing/lost belongings or valuables.   Use CHG Soap as directed on instruction sheet.  Notify your doctor if there is any change in your medical condition (cold, fever, infection).  Wear comfortable clothing (specific to your surgery type) to the hospital.  Plan for stool softeners for home use; pain medications have a tendency to cause constipation. You can also help prevent constipation by eating foods high in fiber such as fruits and vegetables and drinking plenty of fluids as your diet allows.  After surgery, you can help prevent lung complications by doing breathing exercises.  Take deep breaths and cough every 1-2 hours. Your doctor may order a device called an Incentive Spirometer to help you take deep breaths. When coughing or sneezing, hold a pillow firmly against your incision with both hands. This is called "splinting." Doing this helps protect your incision. It also decreases belly discomfort.  If you are being discharged  the day of surgery, you will not be allowed to drive home. You will need a responsible adult (18 years or older) to drive you home and stay with you that night.   If you are taking public transportation, you will need to have a responsible adult (18 years or older) with you. Please confirm with your physician that it is acceptable to use public transportation.   Please call the [facility] at [phone] if you have any questions about these instructions.  Visitation Policy:  Patients undergoing a surgery or procedure may have one family member or support person with them as long as that person is not COVID-19 positive or experiencing its symptoms.  That person may remain in the waiting area during the procedure.

## 2020-02-12 ENCOUNTER — Other Ambulatory Visit
Admission: RE | Admit: 2020-02-12 | Discharge: 2020-02-12 | Disposition: A | Discharge status: Home / Self Care | Payer: PPO | Source: Ambulatory Visit | Attending: Obstetrics & Gynecology | Admitting: Obstetrics & Gynecology

## 2020-02-12 DIAGNOSIS — Z01812 Encounter for preprocedural laboratory examination: Secondary | ICD-10-CM | POA: Insufficient documentation

## 2020-02-12 DIAGNOSIS — Z20822 Contact with and (suspected) exposure to covid-19: Secondary | ICD-10-CM | POA: Diagnosis not present

## 2020-02-12 LAB — SARS CORONAVIRUS 2 (TAT 6-24 HRS): SARS Coronavirus 2: NEGATIVE

## 2020-02-12 LAB — CBC
HCT: 39.9 % (ref 36.0–46.0)
Hemoglobin: 14.2 g/dL (ref 12.0–15.0)
MCH: 33.1 pg (ref 26.0–34.0)
MCHC: 35.6 g/dL (ref 30.0–36.0)
MCV: 93 fL (ref 80.0–100.0)
Platelets: 306 10*3/uL (ref 150–400)
RBC: 4.29 MIL/uL (ref 3.87–5.11)
RDW: 11.5 % (ref 11.5–15.5)
WBC: 7.9 10*3/uL (ref 4.0–10.5)
nRBC: 0 % (ref 0.0–0.2)

## 2020-02-12 LAB — TYPE AND SCREEN
ABO/RH(D): A POS
Antibody Screen: NEGATIVE

## 2020-02-12 MED ORDER — ORAL CARE MOUTH RINSE: 15.0000 mL | Freq: Once | OROMUCOSAL | Status: AC

## 2020-02-12 MED ORDER — POVIDONE-IODINE 10 % EX SWAB
2.0000 "application " | Freq: Once | CUTANEOUS | Status: AC
  Administered 2020-02-13: 2 via TOPICAL

## 2020-02-12 MED ORDER — SODIUM CHLORIDE 0.9 % IV SOLN: INTRAVENOUS | Status: DC

## 2020-02-12 MED ORDER — SCOPOLAMINE 1 MG/3DAYS TD PT72: 1.0000 | MEDICATED_PATCH | TRANSDERMAL | Status: DC

## 2020-02-12 MED ORDER — FAMOTIDINE 20 MG PO TABS: 20.0000 mg | ORAL_TABLET | Freq: Once | ORAL | Status: AC

## 2020-02-12 MED ORDER — CHLORHEXIDINE GLUCONATE 0.12 % MT SOLN: 15.0000 mL | Freq: Once | OROMUCOSAL | Status: AC

## 2020-02-13 ENCOUNTER — Ambulatory Visit: Payer: PPO | Admitting: Urgent Care

## 2020-02-13 ENCOUNTER — Other Ambulatory Visit: Payer: Self-pay

## 2020-02-13 ENCOUNTER — Encounter
Admission: RE | Disposition: A | Discharge status: Home / Self Care | Payer: Self-pay | Source: Home / Self Care | Attending: Obstetrics & Gynecology

## 2020-02-13 ENCOUNTER — Ambulatory Visit
Admission: RE | Admit: 2020-02-13 | Discharge: 2020-02-13 | Disposition: A | Discharge status: Home / Self Care | Payer: PPO | Attending: Obstetrics & Gynecology | Admitting: Obstetrics & Gynecology

## 2020-02-13 ENCOUNTER — Encounter: Payer: Self-pay | Admitting: Obstetrics & Gynecology

## 2020-02-13 ENCOUNTER — Other Ambulatory Visit: Payer: Self-pay | Admitting: Dermatology

## 2020-02-13 DIAGNOSIS — Z79899 Other long term (current) drug therapy: Secondary | ICD-10-CM | POA: Insufficient documentation

## 2020-02-13 DIAGNOSIS — R1031 Right lower quadrant pain: Secondary | ICD-10-CM | POA: Insufficient documentation

## 2020-02-13 DIAGNOSIS — N838 Other noninflammatory disorders of ovary, fallopian tube and broad ligament: Secondary | ICD-10-CM | POA: Insufficient documentation

## 2020-02-13 DIAGNOSIS — Z87891 Personal history of nicotine dependence: Secondary | ICD-10-CM | POA: Diagnosis not present

## 2020-02-13 DIAGNOSIS — Z90710 Acquired absence of both cervix and uterus: Secondary | ICD-10-CM | POA: Insufficient documentation

## 2020-02-13 DIAGNOSIS — N83201 Unspecified ovarian cyst, right side: Secondary | ICD-10-CM | POA: Diagnosis present

## 2020-02-13 DIAGNOSIS — Z885 Allergy status to narcotic agent status: Secondary | ICD-10-CM | POA: Insufficient documentation

## 2020-02-13 DIAGNOSIS — G8929 Other chronic pain: Secondary | ICD-10-CM | POA: Diagnosis not present

## 2020-02-13 DIAGNOSIS — Z79890 Hormone replacement therapy (postmenopausal): Secondary | ICD-10-CM | POA: Insufficient documentation

## 2020-02-13 DIAGNOSIS — R635 Abnormal weight gain: Secondary | ICD-10-CM | POA: Insufficient documentation

## 2020-02-13 DIAGNOSIS — Z7984 Long term (current) use of oral hypoglycemic drugs: Secondary | ICD-10-CM | POA: Diagnosis not present

## 2020-02-13 DIAGNOSIS — L700 Acne vulgaris: Secondary | ICD-10-CM

## 2020-02-13 DIAGNOSIS — Z90721 Acquired absence of ovaries, unilateral: Secondary | ICD-10-CM | POA: Diagnosis not present

## 2020-02-13 DIAGNOSIS — R140 Abdominal distension (gaseous): Secondary | ICD-10-CM | POA: Insufficient documentation

## 2020-02-13 DIAGNOSIS — N8301 Follicular cyst of right ovary: Secondary | ICD-10-CM | POA: Diagnosis not present

## 2020-02-13 DIAGNOSIS — E039 Hypothyroidism, unspecified: Secondary | ICD-10-CM | POA: Diagnosis not present

## 2020-02-13 DIAGNOSIS — N736 Female pelvic peritoneal adhesions (postinfective): Secondary | ICD-10-CM | POA: Diagnosis not present

## 2020-02-13 DIAGNOSIS — Z6828 Body mass index (BMI) 28.0-28.9, adult: Secondary | ICD-10-CM | POA: Insufficient documentation

## 2020-02-13 DIAGNOSIS — Z887 Allergy status to serum and vaccine status: Secondary | ICD-10-CM | POA: Insufficient documentation

## 2020-02-13 DIAGNOSIS — R102 Pelvic and perineal pain: Secondary | ICD-10-CM | POA: Diagnosis present

## 2020-02-13 DIAGNOSIS — I10 Essential (primary) hypertension: Secondary | ICD-10-CM | POA: Diagnosis not present

## 2020-02-13 HISTORY — PX: LAPAROSCOPIC UNILATERAL SALPINGO OOPHERECTOMY: SHX5935

## 2020-02-13 LAB — GLUCOSE, CAPILLARY: Glucose-Capillary: 131 mg/dL — ABNORMAL HIGH (ref 70–99)

## 2020-02-13 LAB — ABO/RH: ABO/RH(D): A POS

## 2020-02-13 SURGERY — SALPINGO-OOPHORECTOMY, UNILATERAL, LAPAROSCOPIC
Anesthesia: General | Laterality: Right

## 2020-02-13 MED ORDER — HYDROMORPHONE HCL 2 MG PO TABS: 1.0000 mg | ORAL_TABLET | ORAL | 0 refills | Status: DC | PRN

## 2020-02-13 MED ORDER — MIDAZOLAM HCL 2 MG/2ML IJ SOLN
INTRAMUSCULAR | Status: AC
  Filled 2020-02-13: qty 2

## 2020-02-13 MED ORDER — EPHEDRINE SULFATE 50 MG/ML IJ SOLN
INTRAMUSCULAR | Status: DC | PRN
  Administered 2020-02-13 (×2): 10 mg via INTRAVENOUS
  Administered 2020-02-13: 5 mg via INTRAVENOUS

## 2020-02-13 MED ORDER — DEXMEDETOMIDINE (PRECEDEX) IN NS 20 MCG/5ML (4 MCG/ML) IV SYRINGE
PREFILLED_SYRINGE | INTRAVENOUS | Status: DC | PRN
  Administered 2020-02-13: 20 ug via INTRAVENOUS

## 2020-02-13 MED ORDER — ROCURONIUM BROMIDE 10 MG/ML (PF) SYRINGE
PREFILLED_SYRINGE | INTRAVENOUS | Status: AC
  Filled 2020-02-13: qty 10

## 2020-02-13 MED ORDER — CHLORHEXIDINE GLUCONATE 0.12 % MT SOLN
OROMUCOSAL | Status: AC
  Administered 2020-02-13: 15 mL via OROMUCOSAL
  Filled 2020-02-13: qty 15

## 2020-02-13 MED ORDER — ONDANSETRON HCL 4 MG/2ML IJ SOLN
INTRAMUSCULAR | Status: DC | PRN
  Administered 2020-02-13 (×2): 4 mg via INTRAVENOUS

## 2020-02-13 MED ORDER — ROCURONIUM BROMIDE 100 MG/10ML IV SOLN
INTRAVENOUS | Status: DC | PRN
  Administered 2020-02-13: 10 mg via INTRAVENOUS
  Administered 2020-02-13: 30 mg via INTRAVENOUS

## 2020-02-13 MED ORDER — LIDOCAINE HCL (CARDIAC) PF 100 MG/5ML IV SOSY
PREFILLED_SYRINGE | INTRAVENOUS | Status: DC | PRN
  Administered 2020-02-13: 100 mg via INTRAVENOUS

## 2020-02-13 MED ORDER — LACTATED RINGERS IV SOLN: INTRAVENOUS | Status: DC

## 2020-02-13 MED ORDER — FENTANYL CITRATE (PF) 100 MCG/2ML IJ SOLN
INTRAMUSCULAR | Status: AC
  Filled 2020-02-13: qty 2

## 2020-02-13 MED ORDER — SEVOFLURANE IN SOLN
RESPIRATORY_TRACT | Status: AC
  Filled 2020-02-13: qty 250

## 2020-02-13 MED ORDER — HYDROMORPHONE HCL 1 MG/ML IJ SOLN
INTRAMUSCULAR | Status: AC
  Administered 2020-02-13: 0.25 mg via INTRAVENOUS
  Filled 2020-02-13: qty 1

## 2020-02-13 MED ORDER — HYDROMORPHONE HCL 1 MG/ML IJ SOLN
INTRAMUSCULAR | Status: DC | PRN
Start: 2020-02-13 — End: 2020-02-13
  Administered 2020-02-13: .25 mg via INTRAVENOUS

## 2020-02-13 MED ORDER — FAMOTIDINE 20 MG PO TABS
ORAL_TABLET | ORAL | Status: AC
  Administered 2020-02-13: 20 mg via ORAL
  Filled 2020-02-13: qty 1

## 2020-02-13 MED ORDER — HYDROMORPHONE HCL 2 MG PO TABS: 1.0000 mg | ORAL_TABLET | ORAL | Status: DC | PRN

## 2020-02-13 MED ORDER — KETOROLAC TROMETHAMINE 30 MG/ML IJ SOLN
INTRAMUSCULAR | Status: DC | PRN
  Administered 2020-02-13: 30 mg via INTRAVENOUS

## 2020-02-13 MED ORDER — LACTATED RINGERS IV SOLN: INTRAVENOUS | Status: DC | PRN

## 2020-02-13 MED ORDER — ACETAMINOPHEN 325 MG PO TABS: 650.0000 mg | ORAL_TABLET | ORAL | Status: DC | PRN

## 2020-02-13 MED ORDER — BUPIVACAINE HCL (PF) 0.5 % IJ SOLN
INTRAMUSCULAR | Status: DC | PRN
  Administered 2020-02-13: 6 mL

## 2020-02-13 MED ORDER — HYDROMORPHONE HCL 1 MG/ML IJ SOLN
INTRAMUSCULAR | Status: AC
  Filled 2020-02-13: qty 1

## 2020-02-13 MED ORDER — PHENYLEPHRINE HCL (PRESSORS) 10 MG/ML IV SOLN
INTRAVENOUS | Status: DC | PRN
  Administered 2020-02-13 (×2): 100 ug via INTRAVENOUS
  Administered 2020-02-13: 200 ug via INTRAVENOUS
  Administered 2020-02-13: 100 ug via INTRAVENOUS

## 2020-02-13 MED ORDER — SUCCINYLCHOLINE CHLORIDE 20 MG/ML IJ SOLN
INTRAMUSCULAR | Status: DC | PRN
  Administered 2020-02-13: 120 mg via INTRAVENOUS

## 2020-02-13 MED ORDER — PROPOFOL 500 MG/50ML IV EMUL
INTRAVENOUS | Status: DC | PRN
  Administered 2020-02-13: 25 ug/kg/min via INTRAVENOUS

## 2020-02-13 MED ORDER — FENTANYL CITRATE (PF) 100 MCG/2ML IJ SOLN: 25.0000 ug | INTRAMUSCULAR | Status: DC | PRN

## 2020-02-13 MED ORDER — HYDROMORPHONE HCL 1 MG/ML IJ SOLN
0.2500 mg | INTRAMUSCULAR | Status: DC | PRN
  Administered 2020-02-13 (×3): 0.25 mg via INTRAVENOUS

## 2020-02-13 MED ORDER — ACETAMINOPHEN 650 MG RE SUPP
650.0000 mg | RECTAL | Status: DC | PRN
  Filled 2020-02-13: qty 1

## 2020-02-13 MED ORDER — PROPOFOL 10 MG/ML IV BOLUS
INTRAVENOUS | Status: DC | PRN
  Administered 2020-02-13: 300 mg via INTRAVENOUS

## 2020-02-13 MED ORDER — FENTANYL CITRATE (PF) 100 MCG/2ML IJ SOLN
INTRAMUSCULAR | Status: DC | PRN
  Administered 2020-02-13: 100 ug via INTRAVENOUS

## 2020-02-13 MED ORDER — MIDAZOLAM HCL 2 MG/2ML IJ SOLN
INTRAMUSCULAR | Status: DC | PRN
  Administered 2020-02-13 (×2): 2 mg via INTRAVENOUS

## 2020-02-13 MED ORDER — DEXMEDETOMIDINE HCL IN NACL 80 MCG/20ML IV SOLN
INTRAVENOUS | Status: AC
  Filled 2020-02-13: qty 20

## 2020-02-13 MED ORDER — GLYCOPYRROLATE 0.2 MG/ML IJ SOLN
INTRAMUSCULAR | Status: DC | PRN
  Administered 2020-02-13: .2 mg via INTRAVENOUS

## 2020-02-13 MED ORDER — SCOPOLAMINE 1 MG/3DAYS TD PT72
MEDICATED_PATCH | TRANSDERMAL | Status: AC
  Administered 2020-02-13: 1.5 mg via TRANSDERMAL
  Filled 2020-02-13: qty 1

## 2020-02-13 MED ORDER — ONDANSETRON HCL 4 MG/2ML IJ SOLN: 4.0000 mg | Freq: Once | INTRAMUSCULAR | Status: DC | PRN

## 2020-02-13 MED ORDER — SUGAMMADEX SODIUM 500 MG/5ML IV SOLN
INTRAVENOUS | Status: DC | PRN
  Administered 2020-02-13: 380 mg via INTRAVENOUS

## 2020-02-13 MED ORDER — DEXAMETHASONE SODIUM PHOSPHATE 10 MG/ML IJ SOLN
INTRAMUSCULAR | Status: DC | PRN
  Administered 2020-02-13: 10 mg via INTRAVENOUS

## 2020-02-13 MED ORDER — MORPHINE SULFATE (PF) 2 MG/ML IV SOLN: 1.0000 mg | INTRAVENOUS | Status: DC | PRN

## 2020-02-13 SURGICAL SUPPLY — 47 items
ADH SKN CLS APL DERMABOND .7 (GAUZE/BANDAGES/DRESSINGS) ×1
ANCHOR TIS RET SYS 235ML (MISCELLANEOUS) ×2 IMPLANT
APL PRP STRL LF DISP 70% ISPRP (MISCELLANEOUS) ×1
BAG TISS RTRVL C235 10X14 (MISCELLANEOUS) ×1
BLADE SURG SZ11 CARB STEEL (BLADE) ×2 IMPLANT
CATH ROBINSON RED A/P 16FR (CATHETERS) IMPLANT
CHLORAPREP W/TINT 26 (MISCELLANEOUS) ×2 IMPLANT
COVER WAND RF STERILE (DRAPES) IMPLANT
DERMABOND ADVANCED (GAUZE/BANDAGES/DRESSINGS) ×1
DERMABOND ADVANCED .7 DNX12 (GAUZE/BANDAGES/DRESSINGS) ×1 IMPLANT
DRAPE LAPAROTOMY 100X77 ABD (DRAPES) ×2 IMPLANT
DRSG TEGADERM 2-3/8X2-3/4 SM (GAUZE/BANDAGES/DRESSINGS) ×6 IMPLANT
DRSG TELFA 4X3 1S NADH ST (GAUZE/BANDAGES/DRESSINGS) IMPLANT
GLOVE BIO SURGEON STRL SZ8 (GLOVE) ×6 IMPLANT
GLOVE INDICATOR 8.0 STRL GRN (GLOVE) ×6 IMPLANT
GOWN STRL REUS W/ TWL LRG LVL3 (GOWN DISPOSABLE) ×2 IMPLANT
GOWN STRL REUS W/ TWL XL LVL3 (GOWN DISPOSABLE) ×1 IMPLANT
GOWN STRL REUS W/TWL LRG LVL3 (GOWN DISPOSABLE) ×4
GOWN STRL REUS W/TWL XL LVL3 (GOWN DISPOSABLE) ×2
GRASPER SUT TROCAR 14GX15 (MISCELLANEOUS) ×4 IMPLANT
IRRIGATION STRYKERFLOW (MISCELLANEOUS) IMPLANT
IRRIGATOR STRYKERFLOW (MISCELLANEOUS)
IV LACTATED RINGERS 1000ML (IV SOLUTION) IMPLANT
KIT PINK PAD W/HEAD ARE REST (MISCELLANEOUS) ×2
KIT PINK PAD W/HEAD ARM REST (MISCELLANEOUS) ×1 IMPLANT
LABEL OR SOLS (LABEL) ×2 IMPLANT
MANIFOLD NEPTUNE II (INSTRUMENTS) IMPLANT
NEEDLE VERESS 14GA 120MM (NEEDLE) ×2 IMPLANT
NS IRRIG 500ML POUR BTL (IV SOLUTION) ×2 IMPLANT
PACK GYN LAPAROSCOPIC (MISCELLANEOUS) ×2 IMPLANT
PAD PREP 24X41 OB/GYN DISP (PERSONAL CARE ITEMS) ×2 IMPLANT
SCISSORS METZENBAUM CVD 33 (INSTRUMENTS) ×2 IMPLANT
SET TUBE SMOKE EVAC HIGH FLOW (TUBING) ×2 IMPLANT
SHEARS HARMONIC ACE PLUS 36CM (ENDOMECHANICALS) ×2 IMPLANT
SLEEVE ENDOPATH XCEL 5M (ENDOMECHANICALS) ×2 IMPLANT
SOL PREP PROV IODINE SCRUB 4OZ (MISCELLANEOUS) IMPLANT
SPONGE GAUZE 2X2 8PLY STRL LF (GAUZE/BANDAGES/DRESSINGS) ×6 IMPLANT
STRAP SAFETY 5IN WIDE (MISCELLANEOUS) ×2 IMPLANT
SUT VIC AB 0 CT1 36 (SUTURE) ×4 IMPLANT
SUT VIC AB 2-0 UR6 27 (SUTURE) IMPLANT
SUT VIC AB 4-0 PS2 18 (SUTURE) IMPLANT
SYR 10ML LL (SYRINGE) ×2 IMPLANT
SYSTEM WECK SHIELD CLOSURE (TROCAR) IMPLANT
TRAY FOLEY SLVR 16FR LF STAT (SET/KITS/TRAYS/PACK) ×2 IMPLANT
TROCAR ENDO BLADELESS 11MM (ENDOMECHANICALS) IMPLANT
TROCAR XCEL NON-BLD 11X100MML (ENDOMECHANICALS) ×2 IMPLANT
TROCAR XCEL NON-BLD 5MMX100MML (ENDOMECHANICALS) ×2 IMPLANT

## 2020-02-13 NOTE — Discharge Instructions (Signed)
AMBULATORY SURGERY  DISCHARGE INSTRUCTIONS   1) The drugs that you were given will stay in your system until tomorrow so for the next 24 hours you should not:  A) Drive an automobile B) Make any legal decisions C) Drink any alcoholic beverage   2) You may resume regular meals tomorrow.  Today it is better to start with liquids and gradually work up to solid foods.  You may eat anything you prefer, but it is better to start with liquids, then soup and crackers, and gradually work up to solid foods.   3) Please notify your doctor immediately if you have any unusual bleeding, trouble breathing, redness and pain at the surgery site, drainage, fever, or pain not relieved by medication. 4)   5) Your post-operative visit with Dr.                                     is: Date:                        Time:    Please call to schedule your post-operative visit.  6) Additional Instructions:    Laparoscopy, Care After This sheet gives you information about how to care for yourself after your procedure. Your health care provider may also give you more specific instructions. If you have problems or questions, contact your health care provider. What can I expect after the procedure? After the procedure, it is common to have:  Mild discomfort in the abdomen.  Sore throat. Women who have laparoscopy with pelvic examination may have mild cramping and fluid coming from the vagina for a few days after the procedure. Follow these instructions at home: Medicines  Take over-the-counter and prescription medicines only as told by your health care provider.  If you were prescribed an antibiotic medicine, take it as told by your health care provider. Do not stop taking the antibiotic even if you start to feel better. Driving  Do not drive for 24 hours if you were given a medicine to help you relax (sedative) during your procedure.  Do not drive or use heavy machinery while taking prescription pain  medicine. Bathing  Do not take baths, swim, or use a hot tub until your health care provider approves. You may take showers. Incision care   Follow instructions from your health care provider about how to take care of your incisions. Make sure you: ? Wash your hands with soap and water before you change your bandage (dressing). If soap and water are not available, use hand sanitizer. ? Change your dressing as told by your health care provider. ? Leave stitches (sutures), skin glue, or adhesive strips in place. These skin closures may need to stay in place for 2 weeks or longer. If adhesive strip edges start to loosen and curl up, you may trim the loose edges. Do not remove adhesive strips completely unless your health care provider tells you to do that.  Check your incision areas every day for signs of infection. Check for: ? Redness, swelling, or pain. ? Fluid or blood. ? Warmth. ? Pus or a bad smell. Activity  Return to your normal activities as told by your health care provider. Ask your health care provider what activities are safe for you.  Do not lift anything that is heavier than 10 lb (4.5 kg), or the limit that you are told, until  your health care provider says that it is safe. General instructions  To prevent or treat constipation while you are taking prescription pain medicine, your health care provider may recommend that you: ? Drink enough fluid to keep your urine pale yellow. ? Take over-the-counter or prescription medicines. ? Eat foods that are high in fiber, such as fresh fruits and vegetables, whole grains, and beans. ? Limit foods that are high in fat and processed sugars, such as fried and sweet foods.  Do not use any products that contain nicotine or tobacco, such as cigarettes and e-cigarettes. If you need help quitting, ask your health care provider.  Keep all follow-up visits as told by your health care provider. This is important. Contact a health care  provider if:  You develop shoulder pain.  You feel lightheaded or faint.  You are unable to pass gas or have a bowel movement.  You feel nauseous or you vomit.  You develop a rash.  You have redness, swelling, or pain around any incision.  You have fluid or blood coming from any incision.  Any incision feels warm to the touch.  You have pus or a bad smell coming from any incision.  You have a fever or chills. Get help right away if:  You have severe pain.  You have vomiting that does not go away.  You have heavy bleeding from the vagina.  Any incision opens.  You have trouble breathing.  You have chest pain. Summary  After the procedure, it is common to have mild discomfort in the abdomen and a sore throat.  Check your incision areas every day for signs of infection.  Return to your normal activities as told by your health care provider. Ask your health care provider what activities are safe for you. This information is not intended to replace advice given to you by your health care provider. Make sure you discuss any questions you have with your health care provider. Document Revised: 02/10/2017 Document Reviewed: 08/24/2016 Elsevier Patient Education  [place].

## 2020-02-13 NOTE — Anesthesia Procedure Notes (Addendum)
Procedure Name: Intubation Performed by: Kelton Pillar, CRNA Pre-anesthesia Checklist: Patient identified, Emergency Drugs available, Suction available and Patient being monitored Patient Re-evaluated:Patient Re-evaluated prior to induction Oxygen Delivery Method: Simple face mask Preoxygenation: Pre-oxygenation with 100% oxygen Induction Type: IV induction Ventilation: Mask ventilation without difficulty Laryngoscope Size: McGraph and 3 Grade View: Grade I Tube type: Oral Number of attempts: 1 Airway Equipment and Method: Stylet Placement Confirmation: ETT inserted through vocal cords under direct vision,  positive ETCO2,  CO2 detector and breath sounds checked- equal and bilateral Secured at: 21 cm Tube secured with: Tape Dental Injury: Teeth and Oropharynx as per pre-operative assessment

## 2020-02-13 NOTE — Op Note (Signed)
Operative Note:  PRE-OP DIAGNOSIS: RLQ pain Ovarian cyst, right   POST-OP DIAGNOSIS: RLQ pain Ovarian cyst, right   PROCEDURE: Procedure(s): LAPAROSCOPIC RIGHT OOPHORECTOMY  SURGEON: Barnett Applebaum, MD, FACOG  ASST: Dr Marisue Brooklyn, MD, No other capable assistant available, in surgery requiring high level assistant.  ANESTHESIA: General endotracheal anesthesia  ESTIMATED BLOOD LOSS: Minimal  SPECIMENS: Right Ovary.  COMPLICATIONS: None  DISPOSITION: stable to PACU  FINDINGS: Intraabdominal adhesions were noted with omentum and small bowel to anterior abdominal wall especially in pelvis.  Did not interfere or involve the right ovary. Right ovary w cyst.  PROCEDURE IN DETAIL: The patient was taken to the OR where anesthesia was administed. The patient was positioned supine. Foley placed.  Attention was turned to the patient's abdomen where a 11 mm skin incision was made in the umbilical fold, after injection of local anesthesia. The Veress step needle was carefully introduced into the peritoneal cavity with placement confirmed using the hanging drop technique. Pneumoperitoneum was obtained. The 11 mm port was then placed under direct visualization with the operative laparoscope The above noted findings. Trendelenburg.  5 mm trocars were then placed in the LLQ and RLQ lateral to the inferior epigastric blood vessels under direct visualization with the laparoscope. Right ovary is identified and the ovarian blood vessels/infundibulopelvic ligament pedicles are coagulated and ligated using the Harmonic scapel. Dissection is carried out for amputation of the adnexa w attached cyst.  It is removed through a specimen bag. No injuries or bleeding was noted.  Rectus fascia at the 11 mm site closed with a vicryl suture.  All instruments and ports were then removed from the abdomen after gas was expelled and patient was leveled. The skin was closed with skin adhesive. The foley catheter was  removed. The patient tolerated the procedure well. All counts were correct x 2. The patient was transferred to the recovery room awake, alert and breathing independently.  The assistance of my assisting-physician was vital to resect and retract interchangably with self on each side.   Barnett Applebaum, MD, [facility], [facility] 02/13/2020  3:37 PM

## 2020-02-13 NOTE — Anesthesia Preprocedure Evaluation (Signed)
Anesthesia Evaluation  Patient identified by MRN, date of birth, ID band Patient awake    Reviewed: Allergy & Precautions, NPO status , Patient's Chart, lab work & pertinent test results  History of Anesthesia Complications (+) PONV and history of anesthetic complications  Airway Mallampati: II  TM Distance: >3 FB Neck ROM: Full    Dental  (+) Dental Advisory Given, Teeth Intact   Pulmonary asthma , former smoker,    Pulmonary exam normal        Cardiovascular hypertension, Pt. on medications Normal cardiovascular exam     Neuro/Psych  Headaches, PSYCHIATRIC DISORDERS Anxiety Depression  Lumbar spinal cord stimulator   Neuromuscular disease    GI/Hepatic Neg liver ROS,  IBS    Endo/Other  diabetes, Type 2, Oral Hypoglycemic AgentsHypothyroidism  Obesity   Renal/GU negative Renal ROS  Female GU complaint     Musculoskeletal  (+) Arthritis , Osteoarthritis,    Abdominal   Peds  (+) ATTENTION DEFICIT DISORDER WITHOUT HYPERACTIVITY Hematology negative hematology ROS (+) anemia ,   Anesthesia Other Findings CRPS of RUE  Reproductive/Obstetrics  S/p hysterectomy                              Anesthesia Physical  Anesthesia Plan  ASA: III  Anesthesia Plan: General   Post-op Pain Management:    Induction: Intravenous  PONV Risk Score and Plan: 4 or greater and Treatment may vary due to age or medical condition, Ondansetron, Scopolamine patch - Pre-op, Midazolam and Dexamethasone  Airway Management Planned: Oral ETT  Additional Equipment: None  Intra-op Plan:   Post-operative Plan: Extubation in OR  Informed Consent: I have reviewed the patients History and Physical, chart, labs and discussed the procedure including the risks, benefits and alternatives for the proposed anesthesia with the patient or authorized representative who has indicated his/her understanding and  acceptance.     Dental advisory given  Plan Discussed with: CRNA and Anesthesiologist  Anesthesia Plan Comments:         Anesthesia Quick Evaluation

## 2020-02-13 NOTE — Progress Notes (Signed)
cbg 115. Glucometer issues

## 2020-02-13 NOTE — Interval H&P Note (Signed)
History and Physical Interval Note:  02/13/2020 1:40 PM  Tanya [name]  has presented today for surgery, with the diagnosis of RLQ pain Ovarian cyst, right.  The various methods of treatment have been discussed with the patient and family. After consideration of risks, benefits and other options for treatment, the patient has consented to  Procedure(s): LAPAROSCOPIC RIGHT OOPHORECTOMY (Right) as a surgical intervention.  The patient's history has been reviewed, patient examined, no change in status, stable for surgery.  I have reviewed the patient's chart and labs.  Questions were answered to the patient's satisfaction.     Hoyt Koch

## 2020-02-13 NOTE — Transfer of Care (Signed)
Immediate Anesthesia Transfer of Care Note  Patient: Tanya [name]  Procedure(s) Performed: LAPAROSCOPIC RIGHT OOPHORECTOMY (Right )  Patient Location: PACU  Anesthesia Type:General  Level of Consciousness: drowsy, patient cooperative and responds to stimulation  Airway & Oxygen Therapy: Patient Spontanous Breathing and Patient connected to face mask oxygen  Post-op Assessment: Report given to RN and Post -op Vital signs reviewed and stable  Post vital signs: Reviewed and stable  Last Vitals:  Vitals Value Taken Time  BP 113/55 02/13/20 1555  Temp 36.2 C 02/13/20 1555  Pulse 82 02/13/20 1604  Resp 18 02/13/20 1603  SpO2 100 % 02/13/20 1604  Vitals shown include unvalidated device data.  Last Pain:  Vitals:   02/13/20 1555  TempSrc:   PainSc: Asleep         Complications: No complications documented.

## 2020-02-14 ENCOUNTER — Encounter: Payer: Self-pay | Admitting: Obstetrics & Gynecology

## 2020-02-14 NOTE — Anesthesia Postprocedure Evaluation (Signed)
Anesthesia Post Note  Patient: Tanya [name]  Procedure(s) Performed: LAPAROSCOPIC RIGHT OOPHORECTOMY (Right )  Patient location during evaluation: PACU Anesthesia Type: General Level of consciousness: awake Pain management: pain level controlled Vital Signs Assessment: post-procedure vital signs reviewed and stable Respiratory status: spontaneous breathing Cardiovascular status: blood pressure returned to baseline and stable Postop Assessment: no apparent nausea or vomiting Anesthetic complications: no   No complications documented.   Last Vitals:  Vitals:   02/13/20 1637 02/13/20 1701  BP: (!) 106/56 104/61  Pulse: 77 67  Resp: 14 18  Temp:  36.6 C  SpO2: 99% 97%    Last Pain:  Vitals:   02/13/20 1701  TempSrc: Oral  PainSc: 9                  Neva Seat

## 2020-02-17 LAB — GLUCOSE, CAPILLARY: Glucose-Capillary: 115 mg/dL — ABNORMAL HIGH (ref 70–99)

## 2020-02-18 ENCOUNTER — Ambulatory Visit: Payer: PPO | Admitting: Dermatology

## 2020-02-18 LAB — SURGICAL PATHOLOGY

## 2020-02-26 ENCOUNTER — Ambulatory Visit: Payer: PPO | Admitting: Obstetrics & Gynecology

## 2020-04-10 DIAGNOSIS — M533 Sacrococcygeal disorders, not elsewhere classified: Secondary | ICD-10-CM | POA: Diagnosis not present

## 2020-04-10 DIAGNOSIS — G894 Chronic pain syndrome: Secondary | ICD-10-CM | POA: Diagnosis not present

## 2020-04-16 DIAGNOSIS — E1165 Type 2 diabetes mellitus with hyperglycemia: Secondary | ICD-10-CM | POA: Diagnosis not present

## 2020-04-16 DIAGNOSIS — R7302 Impaired glucose tolerance (oral): Secondary | ICD-10-CM | POA: Diagnosis not present

## 2020-04-16 DIAGNOSIS — E039 Hypothyroidism, unspecified: Secondary | ICD-10-CM | POA: Diagnosis not present

## 2020-04-22 ENCOUNTER — Other Ambulatory Visit: Payer: Self-pay | Admitting: Primary Care

## 2020-04-22 DIAGNOSIS — E039 Hypothyroidism, unspecified: Secondary | ICD-10-CM

## 2020-04-23 DIAGNOSIS — G9050 Complex regional pain syndrome I, unspecified: Secondary | ICD-10-CM | POA: Diagnosis not present

## 2020-04-23 DIAGNOSIS — E039 Hypothyroidism, unspecified: Secondary | ICD-10-CM | POA: Diagnosis not present

## 2020-04-23 DIAGNOSIS — F341 Dysthymic disorder: Secondary | ICD-10-CM | POA: Diagnosis not present

## 2020-04-23 DIAGNOSIS — J45909 Unspecified asthma, uncomplicated: Secondary | ICD-10-CM | POA: Diagnosis not present

## 2020-04-23 DIAGNOSIS — E1165 Type 2 diabetes mellitus with hyperglycemia: Secondary | ICD-10-CM | POA: Diagnosis not present

## 2020-04-23 DIAGNOSIS — Z6830 Body mass index (BMI) 30.0-30.9, adult: Secondary | ICD-10-CM | POA: Diagnosis not present

## 2020-04-23 DIAGNOSIS — F909 Attention-deficit hyperactivity disorder, unspecified type: Secondary | ICD-10-CM | POA: Diagnosis not present

## 2020-04-23 DIAGNOSIS — E248 Other Cushing's syndrome: Secondary | ICD-10-CM | POA: Diagnosis not present

## 2020-04-23 DIAGNOSIS — E669 Obesity, unspecified: Secondary | ICD-10-CM | POA: Diagnosis not present

## 2020-04-28 ENCOUNTER — Telehealth: Payer: Self-pay | Admitting: *Deleted

## 2020-04-28 DIAGNOSIS — M5417 Radiculopathy, lumbosacral region: Secondary | ICD-10-CM | POA: Diagnosis not present

## 2020-04-28 DIAGNOSIS — G894 Chronic pain syndrome: Secondary | ICD-10-CM | POA: Diagnosis not present

## 2020-04-28 DIAGNOSIS — Z5181 Encounter for therapeutic drug level monitoring: Secondary | ICD-10-CM | POA: Diagnosis not present

## 2020-04-28 DIAGNOSIS — Z79899 Other long term (current) drug therapy: Secondary | ICD-10-CM | POA: Diagnosis not present

## 2020-04-28 DIAGNOSIS — G90519 Complex regional pain syndrome I of unspecified upper limb: Secondary | ICD-10-CM | POA: Diagnosis not present

## 2020-04-28 NOTE — Telephone Encounter (Signed)
Where did she have her sodium and potassium levels checked?  The last ones I see are from November 2021.  If she has not had her electrolytes checked, I recommend we add them on with the magnesium that she is requesting.

## 2020-04-28 NOTE — Telephone Encounter (Signed)
Mail box is full not able to leave v/m

## 2020-04-28 NOTE — Telephone Encounter (Signed)
Patient left a voicemail stating that her potassium and sodium levels are fine. Patient stated that she would like for Tanya Bossier NP to order lab work to check her magnesium. Patient stated that she is suffering from painful muscle spasms. Patient requested a call back that she can come in have this lab work done.

## 2020-04-29 DIAGNOSIS — M533 Sacrococcygeal disorders, not elsewhere classified: Secondary | ICD-10-CM | POA: Diagnosis not present

## 2020-04-30 NOTE — Telephone Encounter (Signed)
Mail box is full will send my chart with questions.

## 2020-05-01 NOTE — Telephone Encounter (Signed)
Have called patient x 3 not able to reach or leave v/m please advise.

## 2020-05-01 NOTE — Telephone Encounter (Signed)
Called patient mailbox is full

## 2020-05-01 NOTE — Telephone Encounter (Signed)
Noted, will await patient to return our calls.

## 2020-05-07 DIAGNOSIS — M25559 Pain in unspecified hip: Secondary | ICD-10-CM | POA: Diagnosis not present

## 2020-05-18 DIAGNOSIS — F900 Attention-deficit hyperactivity disorder, predominantly inattentive type: Secondary | ICD-10-CM | POA: Diagnosis not present

## 2020-05-18 DIAGNOSIS — F4312 Post-traumatic stress disorder, chronic: Secondary | ICD-10-CM | POA: Diagnosis not present

## 2020-05-18 DIAGNOSIS — F3342 Major depressive disorder, recurrent, in full remission: Secondary | ICD-10-CM | POA: Diagnosis not present

## 2020-06-29 ENCOUNTER — Other Ambulatory Visit: Payer: Self-pay | Admitting: Dermatology

## 2020-06-29 DIAGNOSIS — L700 Acne vulgaris: Secondary | ICD-10-CM

## 2020-07-09 DIAGNOSIS — M961 Postlaminectomy syndrome, not elsewhere classified: Secondary | ICD-10-CM | POA: Diagnosis not present

## 2020-07-09 DIAGNOSIS — M533 Sacrococcygeal disorders, not elsewhere classified: Secondary | ICD-10-CM | POA: Diagnosis not present

## 2020-07-09 DIAGNOSIS — M791 Myalgia, unspecified site: Secondary | ICD-10-CM | POA: Diagnosis not present

## 2020-07-20 DIAGNOSIS — Z5181 Encounter for therapeutic drug level monitoring: Secondary | ICD-10-CM | POA: Diagnosis not present

## 2020-07-20 DIAGNOSIS — M545 Low back pain, unspecified: Secondary | ICD-10-CM | POA: Diagnosis not present

## 2020-07-20 DIAGNOSIS — M25559 Pain in unspecified hip: Secondary | ICD-10-CM | POA: Diagnosis not present

## 2020-07-20 DIAGNOSIS — Z79899 Other long term (current) drug therapy: Secondary | ICD-10-CM | POA: Diagnosis not present

## 2020-07-20 DIAGNOSIS — G894 Chronic pain syndrome: Secondary | ICD-10-CM | POA: Diagnosis not present

## 2020-07-20 DIAGNOSIS — G90519 Complex regional pain syndrome I of unspecified upper limb: Secondary | ICD-10-CM | POA: Diagnosis not present

## 2020-07-20 DIAGNOSIS — G8929 Other chronic pain: Secondary | ICD-10-CM | POA: Diagnosis not present

## 2020-08-25 DIAGNOSIS — M25552 Pain in left hip: Secondary | ICD-10-CM | POA: Diagnosis not present

## 2020-08-25 DIAGNOSIS — G8929 Other chronic pain: Secondary | ICD-10-CM | POA: Diagnosis not present

## 2020-08-25 DIAGNOSIS — M25551 Pain in right hip: Secondary | ICD-10-CM | POA: Diagnosis not present

## 2020-09-08 DIAGNOSIS — G894 Chronic pain syndrome: Secondary | ICD-10-CM | POA: Diagnosis not present

## 2020-10-17 ENCOUNTER — Other Ambulatory Visit: Payer: Self-pay | Admitting: Primary Care

## 2020-10-17 DIAGNOSIS — E039 Hypothyroidism, unspecified: Secondary | ICD-10-CM

## 2020-10-21 DIAGNOSIS — R7302 Impaired glucose tolerance (oral): Secondary | ICD-10-CM | POA: Diagnosis not present

## 2020-10-21 DIAGNOSIS — E669 Obesity, unspecified: Secondary | ICD-10-CM | POA: Diagnosis not present

## 2020-10-21 DIAGNOSIS — L680 Hirsutism: Secondary | ICD-10-CM | POA: Diagnosis not present

## 2020-10-21 DIAGNOSIS — E1165 Type 2 diabetes mellitus with hyperglycemia: Secondary | ICD-10-CM | POA: Diagnosis not present

## 2020-10-21 DIAGNOSIS — R601 Generalized edema: Secondary | ICD-10-CM | POA: Diagnosis not present

## 2020-10-21 DIAGNOSIS — E039 Hypothyroidism, unspecified: Secondary | ICD-10-CM | POA: Diagnosis not present

## 2020-10-28 DIAGNOSIS — F909 Attention-deficit hyperactivity disorder, unspecified type: Secondary | ICD-10-CM | POA: Diagnosis not present

## 2020-10-28 DIAGNOSIS — G9050 Complex regional pain syndrome I, unspecified: Secondary | ICD-10-CM | POA: Diagnosis not present

## 2020-10-28 DIAGNOSIS — E248 Other Cushing's syndrome: Secondary | ICD-10-CM | POA: Diagnosis not present

## 2020-10-28 DIAGNOSIS — Z6833 Body mass index (BMI) 33.0-33.9, adult: Secondary | ICD-10-CM | POA: Diagnosis not present

## 2020-10-28 DIAGNOSIS — E1165 Type 2 diabetes mellitus with hyperglycemia: Secondary | ICD-10-CM | POA: Diagnosis not present

## 2020-10-28 DIAGNOSIS — E039 Hypothyroidism, unspecified: Secondary | ICD-10-CM | POA: Diagnosis not present

## 2020-10-28 DIAGNOSIS — J45909 Unspecified asthma, uncomplicated: Secondary | ICD-10-CM | POA: Diagnosis not present

## 2020-10-28 DIAGNOSIS — E669 Obesity, unspecified: Secondary | ICD-10-CM | POA: Diagnosis not present

## 2020-10-28 DIAGNOSIS — F341 Dysthymic disorder: Secondary | ICD-10-CM | POA: Diagnosis not present

## 2020-11-06 DIAGNOSIS — H04123 Dry eye syndrome of bilateral lacrimal glands: Secondary | ICD-10-CM | POA: Diagnosis not present

## 2020-11-06 DIAGNOSIS — H2513 Age-related nuclear cataract, bilateral: Secondary | ICD-10-CM | POA: Diagnosis not present

## 2020-11-06 DIAGNOSIS — G43119 Migraine with aura, intractable, without status migrainosus: Secondary | ICD-10-CM | POA: Diagnosis not present

## 2020-11-09 DIAGNOSIS — G894 Chronic pain syndrome: Secondary | ICD-10-CM | POA: Diagnosis not present

## 2020-11-09 DIAGNOSIS — M62830 Muscle spasm of back: Secondary | ICD-10-CM | POA: Diagnosis not present

## 2020-11-10 DIAGNOSIS — G8929 Other chronic pain: Secondary | ICD-10-CM | POA: Diagnosis not present

## 2020-11-10 DIAGNOSIS — M25551 Pain in right hip: Secondary | ICD-10-CM | POA: Diagnosis not present

## 2020-11-10 DIAGNOSIS — G894 Chronic pain syndrome: Secondary | ICD-10-CM | POA: Diagnosis not present

## 2020-11-10 DIAGNOSIS — M25552 Pain in left hip: Secondary | ICD-10-CM | POA: Diagnosis not present

## 2020-11-10 DIAGNOSIS — M545 Low back pain, unspecified: Secondary | ICD-10-CM | POA: Diagnosis not present

## 2020-11-10 DIAGNOSIS — G90519 Complex regional pain syndrome I of unspecified upper limb: Secondary | ICD-10-CM | POA: Diagnosis not present

## 2020-11-10 DIAGNOSIS — Z5181 Encounter for therapeutic drug level monitoring: Secondary | ICD-10-CM | POA: Diagnosis not present

## 2020-11-10 DIAGNOSIS — Z79899 Other long term (current) drug therapy: Secondary | ICD-10-CM | POA: Diagnosis not present

## 2020-11-12 DIAGNOSIS — F411 Generalized anxiety disorder: Secondary | ICD-10-CM | POA: Diagnosis not present

## 2020-11-12 DIAGNOSIS — F900 Attention-deficit hyperactivity disorder, predominantly inattentive type: Secondary | ICD-10-CM | POA: Diagnosis not present

## 2020-11-12 DIAGNOSIS — F4312 Post-traumatic stress disorder, chronic: Secondary | ICD-10-CM | POA: Diagnosis not present

## 2020-11-12 DIAGNOSIS — F3341 Major depressive disorder, recurrent, in partial remission: Secondary | ICD-10-CM | POA: Diagnosis not present

## 2020-12-03 ENCOUNTER — Ambulatory Visit: Payer: PPO | Admitting: Dermatology

## 2020-12-03 ENCOUNTER — Other Ambulatory Visit: Payer: Self-pay

## 2020-12-03 DIAGNOSIS — L739 Follicular disorder, unspecified: Secondary | ICD-10-CM

## 2020-12-03 DIAGNOSIS — L700 Acne vulgaris: Secondary | ICD-10-CM | POA: Diagnosis not present

## 2020-12-03 DIAGNOSIS — R21 Rash and other nonspecific skin eruption: Secondary | ICD-10-CM

## 2020-12-03 MED ORDER — CLINDAMYCIN PHOSPHATE 1 % EX SOLN: Freq: Two times a day (BID) | CUTANEOUS | 0 refills | Status: AC

## 2020-12-03 MED ORDER — MUPIROCIN 2 % EX OINT: 1.0000 "application " | TOPICAL_OINTMENT | Freq: Every day | CUTANEOUS | 0 refills | Status: DC

## 2020-12-03 MED ORDER — VALACYCLOVIR HCL 1 G PO TABS: 1000.0000 mg | ORAL_TABLET | Freq: Three times a day (TID) | ORAL | 0 refills | Status: AC

## 2020-12-03 NOTE — Progress Notes (Signed)
   Follow-Up Visit   Subjective  Tanya [name] is a 50 y.o. female who presents for the following: Rash (Patient here today for rash that has been present at the left inner upper thigh for 2-3 weeks. The rash was itchy and oozing but has improved. Patient is using hibaclens in the shower. ).  Patient has had shingles in the past.   The following portions of the chart were reviewed this encounter and updated as appropriate:   Tobacco  Allergies  Meds  Problems  Med Hx  Surg Hx  Fam Hx      Review of Systems:  No other skin or systemic complaints except as noted in HPI or Assessment and Plan.  Objective  Well appearing patient in no apparent distress; mood and affect are within normal limits.  A focused examination was performed including face, legs, inguinal folds. Relevant physical exam findings are noted in the Assessment and Plan.  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 Take with large glass of water  valACYclovir (VALTREX) 1000 MG tablet - Left Medial Thigh Take 1 tablet (1,000 mg total) by mouth every 8 (eight) hours for 7 days.  Related Procedures HSV and VZV PCR Panel  Acne vulgaris face  Chronic condition with duration over one year. Condition is bothersome to patient. Not currently at goal.  Start clindamycin solution twice daily. Avoid scratching.  Recommend [facility] Hypochlorous Spray (found in the wound care section) OR Cln brand Acne or Sports wash. The [facility] Hypochlorous Spray can be sprayed on daily and left on. The Cln wash should be applied to  the affected area daily for at least 30 seconds and then rinsed off. If you are using clindamycin solution or lotion to treat acne, using a hypochlorous product may help lower the risk of antibiotic resistant bacteria.    clindamycin (CLEOCIN T) 1 % external solution - face Apply topically 2 (two) times daily.  Folliculitis Right Thigh - Anterior  Recommend hibaclens or [facility] hypochlorous to wash areas (avoid to vaginal area) Start mupirocin daily to open areas and cover with band aid.   mupirocin ointment (BACTROBAN) 2 % - Right Thigh - Anterior Apply 1 application topically daily. To open areas and cover with band aid.  Return if symptoms worsen or fail to improve.  Graciella Belton, RMA, am acting as scribe for Forest Gleason, MD .  Documentation: I have reviewed the above documentation for accuracy and completeness, and I agree with the above.  Forest Gleason, MD

## 2020-12-03 NOTE — Patient Instructions (Signed)
Recommend [facility] Hypochlorous Spray (found in the wound care section) OR Cln brand Acne or Sports wash. The [facility] Hypochlorous Spray can be sprayed on daily and left on. The Cln wash should be applied to the affected area daily for at least 30 seconds and then rinsed off. If you are using clindamycin solution or lotion to treat acne, using a hypochlorous product may help lower the risk of antibiotic resistant bacteria.   If you have any questions or concerns for your doctor, please call our main line at [phone] and press option 4 to reach your doctor's medical assistant. If no one answers, please leave a voicemail as directed and we will return your call as soon as possible. Messages left after 4 pm will be answered the following business day.   You may also send [place] a message via [place]. We typically respond to MyChart messages within 1-2 business days.  For prescription refills, please ask your pharmacy to contact our office. Our fax number is [phone].  If you have an urgent issue when the clinic is closed that cannot wait until the next business day, you can page your doctor at the number below.    Please note that while we do our best to be available for urgent issues outside of office hours, we are not available 24/7.   If you have an urgent issue and are unable to reach [place], you may choose to seek medical care at your doctor's office, retail clinic, urgent care center, or emergency room.  If you have a medical emergency, please immediately call 911 or go to the emergency department.  Pager Numbers  - Dr. Nehemiah Massed: [phone]  - Dr. Laurence Ferrari: [phone]  - Dr. Nicole Kindred: [phone]  In the event of inclement weather, please call our main line at [phone] for an update on the status of any delays or closures.  Dermatology Medication Tips: Please keep the boxes that topical medications come in in order to help keep track of the instructions about where and how to use  these. Pharmacies typically print the medication instructions only on the boxes and not directly on the medication tubes.   If your medication is too expensive, please contact our office at [phone] option 4 or send [place] a message through [place].   We are unable to tell what your co-pay for medications will be in advance as this is different depending on your insurance coverage. However, we may be able to find a substitute medication at lower cost or fill out paperwork to get insurance to cover a needed medication.   If a prior authorization is required to get your medication covered by your insurance company, please allow [place] 1-2 business days to complete this process.  Drug prices often vary depending on where the prescription is filled and some pharmacies may offer cheaper prices.  The website www.goodrx.com contains coupons for medications through different pharmacies. The prices here do not account for what the cost may be with help from insurance (it may be cheaper with your insurance), but the website can give you the price if you did not use any insurance.  - You can print the associated coupon and take it with your prescription to the pharmacy.  - You may also stop by our office during regular business hours and pick up a GoodRx coupon card.  - If you need your prescription sent electronically to a different pharmacy, notify our office through [facility] or by phone at [phone] option 4.

## 2020-12-04 DIAGNOSIS — R21 Rash and other nonspecific skin eruption: Secondary | ICD-10-CM | POA: Diagnosis not present

## 2020-12-08 ENCOUNTER — Encounter: Payer: Self-pay | Admitting: Dermatology

## 2020-12-08 ENCOUNTER — Telehealth: Payer: Self-pay

## 2020-12-08 NOTE — Telephone Encounter (Signed)
Patient called looking for test results but advised that they are still pending at this time.   She states the area has spread to the back of her left tricep at this time but the area examined last week is starting to clear.

## 2020-12-09 LAB — HSV AND VZV PCR PANEL
HSV 2 DNA: NEGATIVE
HSV-1 DNA: NEGATIVE
Varicella-Zoster, PCR: NEGATIVE

## 2020-12-09 NOTE — Telephone Encounter (Signed)
Thank you. Messaged patient with results and to request photo of new area.

## 2020-12-10 DIAGNOSIS — M47816 Spondylosis without myelopathy or radiculopathy, lumbar region: Secondary | ICD-10-CM | POA: Diagnosis not present

## 2020-12-14 ENCOUNTER — Telehealth: Payer: Self-pay

## 2020-12-14 NOTE — Telephone Encounter (Signed)
Left pt message to call back for bx results.

## 2021-04-12 DIAGNOSIS — M25552 Pain in left hip: Secondary | ICD-10-CM | POA: Diagnosis not present

## 2021-04-12 DIAGNOSIS — M25551 Pain in right hip: Secondary | ICD-10-CM | POA: Diagnosis not present

## 2021-04-12 DIAGNOSIS — M47816 Spondylosis without myelopathy or radiculopathy, lumbar region: Secondary | ICD-10-CM | POA: Diagnosis not present

## 2021-04-12 DIAGNOSIS — G90519 Complex regional pain syndrome I of unspecified upper limb: Secondary | ICD-10-CM | POA: Diagnosis not present

## 2021-04-14 DIAGNOSIS — Z131 Encounter for screening for diabetes mellitus: Secondary | ICD-10-CM | POA: Diagnosis not present

## 2021-04-29 DIAGNOSIS — E669 Obesity, unspecified: Secondary | ICD-10-CM | POA: Diagnosis not present

## 2021-04-29 DIAGNOSIS — E1165 Type 2 diabetes mellitus with hyperglycemia: Secondary | ICD-10-CM | POA: Diagnosis not present

## 2021-04-29 DIAGNOSIS — R0602 Shortness of breath: Secondary | ICD-10-CM | POA: Diagnosis not present

## 2021-04-29 DIAGNOSIS — R7302 Impaired glucose tolerance (oral): Secondary | ICD-10-CM | POA: Diagnosis not present

## 2021-04-29 DIAGNOSIS — L680 Hirsutism: Secondary | ICD-10-CM | POA: Diagnosis not present

## 2021-04-29 DIAGNOSIS — E039 Hypothyroidism, unspecified: Secondary | ICD-10-CM | POA: Diagnosis not present

## 2021-05-10 DIAGNOSIS — E039 Hypothyroidism, unspecified: Secondary | ICD-10-CM | POA: Diagnosis not present

## 2021-05-10 DIAGNOSIS — F909 Attention-deficit hyperactivity disorder, unspecified type: Secondary | ICD-10-CM | POA: Diagnosis not present

## 2021-05-10 DIAGNOSIS — F341 Dysthymic disorder: Secondary | ICD-10-CM | POA: Diagnosis not present

## 2021-05-10 DIAGNOSIS — Z6833 Body mass index (BMI) 33.0-33.9, adult: Secondary | ICD-10-CM | POA: Diagnosis not present

## 2021-05-10 DIAGNOSIS — J45909 Unspecified asthma, uncomplicated: Secondary | ICD-10-CM | POA: Diagnosis not present

## 2021-05-10 DIAGNOSIS — G9050 Complex regional pain syndrome I, unspecified: Secondary | ICD-10-CM | POA: Diagnosis not present

## 2021-05-10 DIAGNOSIS — E1165 Type 2 diabetes mellitus with hyperglycemia: Secondary | ICD-10-CM | POA: Diagnosis not present

## 2021-05-10 DIAGNOSIS — Z1331 Encounter for screening for depression: Secondary | ICD-10-CM | POA: Diagnosis not present

## 2021-05-10 DIAGNOSIS — E248 Other Cushing's syndrome: Secondary | ICD-10-CM | POA: Diagnosis not present

## 2021-05-10 DIAGNOSIS — E669 Obesity, unspecified: Secondary | ICD-10-CM | POA: Diagnosis not present

## 2021-05-11 DIAGNOSIS — Z87891 Personal history of nicotine dependence: Secondary | ICD-10-CM | POA: Diagnosis not present

## 2021-05-11 DIAGNOSIS — J45909 Unspecified asthma, uncomplicated: Secondary | ICD-10-CM | POA: Diagnosis not present

## 2021-05-11 DIAGNOSIS — F988 Other specified behavioral and emotional disorders with onset usually occurring in childhood and adolescence: Secondary | ICD-10-CM | POA: Diagnosis not present

## 2021-05-11 DIAGNOSIS — M47817 Spondylosis without myelopathy or radiculopathy, lumbosacral region: Secondary | ICD-10-CM | POA: Diagnosis not present

## 2021-05-11 DIAGNOSIS — Z885 Allergy status to narcotic agent status: Secondary | ICD-10-CM | POA: Diagnosis not present

## 2021-05-11 DIAGNOSIS — G894 Chronic pain syndrome: Secondary | ICD-10-CM | POA: Diagnosis not present

## 2021-05-11 DIAGNOSIS — E079 Disorder of thyroid, unspecified: Secondary | ICD-10-CM | POA: Diagnosis not present

## 2021-05-21 DIAGNOSIS — M25551 Pain in right hip: Secondary | ICD-10-CM | POA: Diagnosis not present

## 2021-05-21 DIAGNOSIS — M160 Bilateral primary osteoarthritis of hip: Secondary | ICD-10-CM | POA: Diagnosis not present

## 2021-05-21 DIAGNOSIS — G8929 Other chronic pain: Secondary | ICD-10-CM | POA: Diagnosis not present

## 2021-05-21 DIAGNOSIS — M25552 Pain in left hip: Secondary | ICD-10-CM | POA: Diagnosis not present

## 2021-05-30 DIAGNOSIS — M25552 Pain in left hip: Secondary | ICD-10-CM | POA: Diagnosis not present

## 2021-05-30 DIAGNOSIS — M25551 Pain in right hip: Secondary | ICD-10-CM | POA: Diagnosis not present

## 2021-07-05 DIAGNOSIS — Z5181 Encounter for therapeutic drug level monitoring: Secondary | ICD-10-CM | POA: Diagnosis not present

## 2021-07-05 DIAGNOSIS — Z79899 Other long term (current) drug therapy: Secondary | ICD-10-CM | POA: Diagnosis not present

## 2021-07-05 DIAGNOSIS — M47816 Spondylosis without myelopathy or radiculopathy, lumbar region: Secondary | ICD-10-CM | POA: Diagnosis not present

## 2021-07-05 DIAGNOSIS — M25551 Pain in right hip: Secondary | ICD-10-CM | POA: Diagnosis not present

## 2021-07-05 DIAGNOSIS — M545 Low back pain, unspecified: Secondary | ICD-10-CM | POA: Diagnosis not present

## 2021-07-05 DIAGNOSIS — M25552 Pain in left hip: Secondary | ICD-10-CM | POA: Diagnosis not present

## 2021-07-05 DIAGNOSIS — G8929 Other chronic pain: Secondary | ICD-10-CM | POA: Diagnosis not present

## 2021-07-29 DIAGNOSIS — F331 Major depressive disorder, recurrent, moderate: Secondary | ICD-10-CM | POA: Diagnosis not present

## 2021-07-29 DIAGNOSIS — F900 Attention-deficit hyperactivity disorder, predominantly inattentive type: Secondary | ICD-10-CM | POA: Diagnosis not present

## 2021-07-29 DIAGNOSIS — F4312 Post-traumatic stress disorder, chronic: Secondary | ICD-10-CM | POA: Diagnosis not present

## 2021-07-29 DIAGNOSIS — F411 Generalized anxiety disorder: Secondary | ICD-10-CM | POA: Diagnosis not present

## 2021-08-18 IMAGING — CR DG CHEST 2V
2 series · 2 of 2 positions shown · non-contrast
Comparison: 05/02/2010

CLINICAL DATA: Back pain, preop evaluation for lumbar surgery

EXAM:
CHEST - 2 VIEW

[w chest pa]
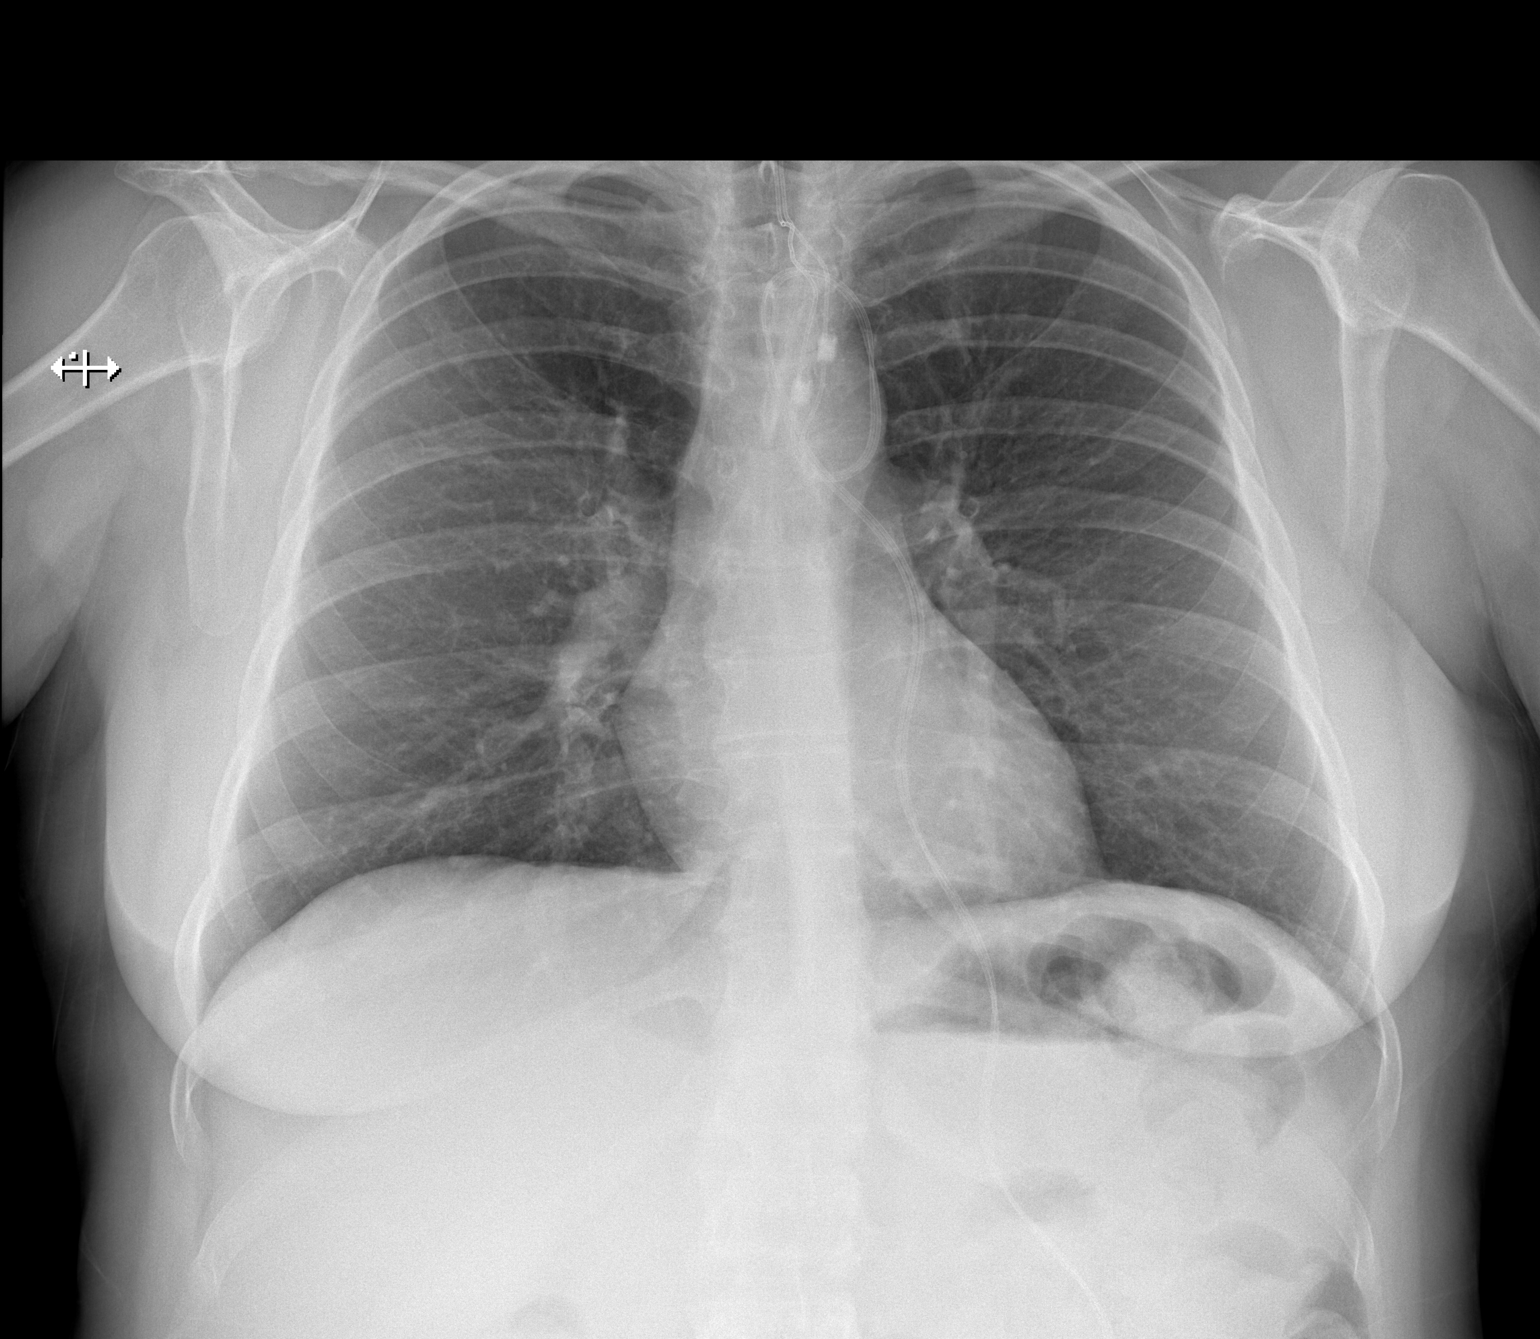

[w chest lat]
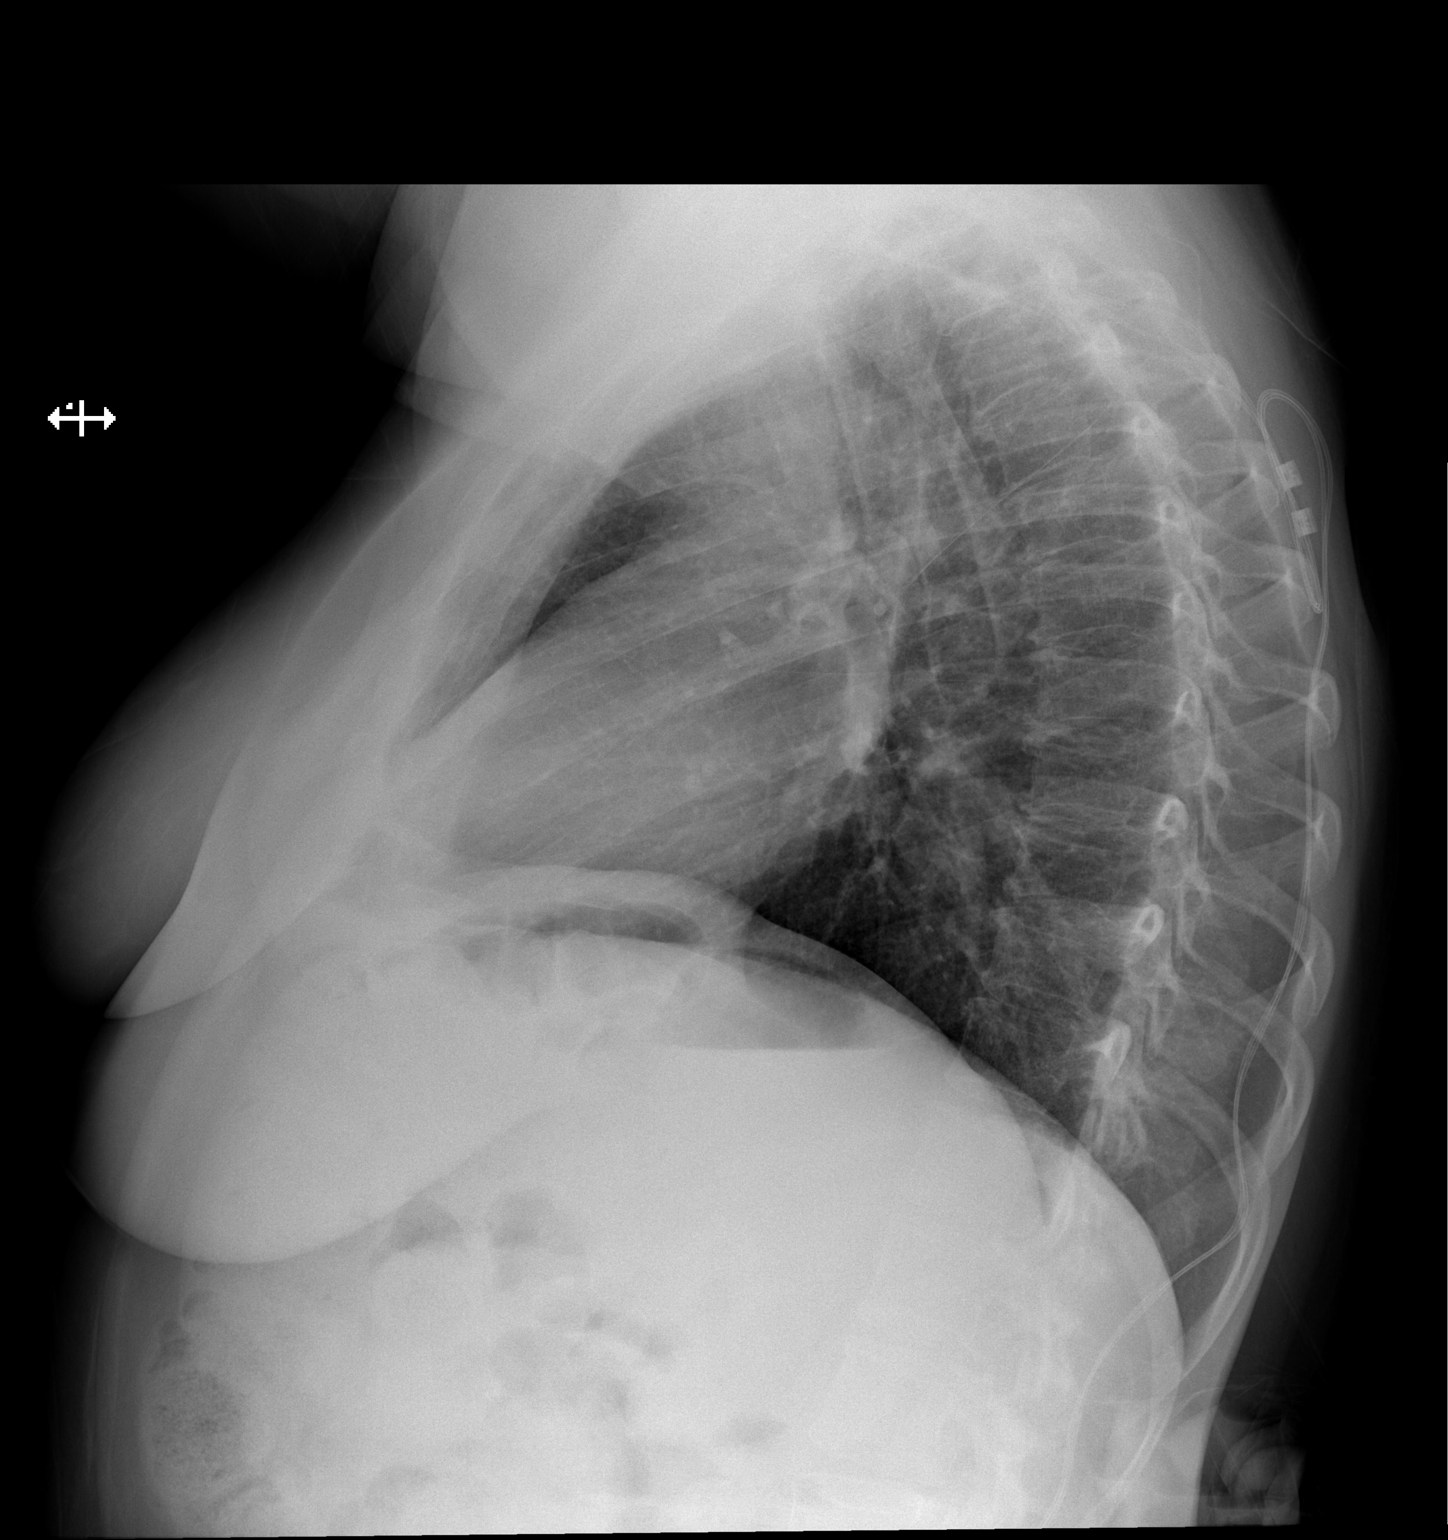

[2 of 2 positions shown; findings below may reference images not displayed]

FINDINGS: Cardiac shadows within normal limits. Spinal stimulator is noted as
well as postsurgical changes in the cervical spine. The lungs are
clear. No acute abnormality is noted.
IMPRESSION: No active cardiopulmonary disease.

## 2021-08-23 DIAGNOSIS — M25551 Pain in right hip: Secondary | ICD-10-CM | POA: Diagnosis not present

## 2021-08-23 DIAGNOSIS — M160 Bilateral primary osteoarthritis of hip: Secondary | ICD-10-CM | POA: Diagnosis not present

## 2021-08-23 DIAGNOSIS — M25552 Pain in left hip: Secondary | ICD-10-CM | POA: Diagnosis not present

## 2021-08-25 IMAGING — CR DG LUMBAR SPINE 1V
1 series · 1 of 1 positions shown · non-contrast
Comparison: NONE

CLINICAL DATA: LUMBAR DISC DISEASE.

EXAM:
LUMBAR SPINE - 1 VIEW

[lateral]
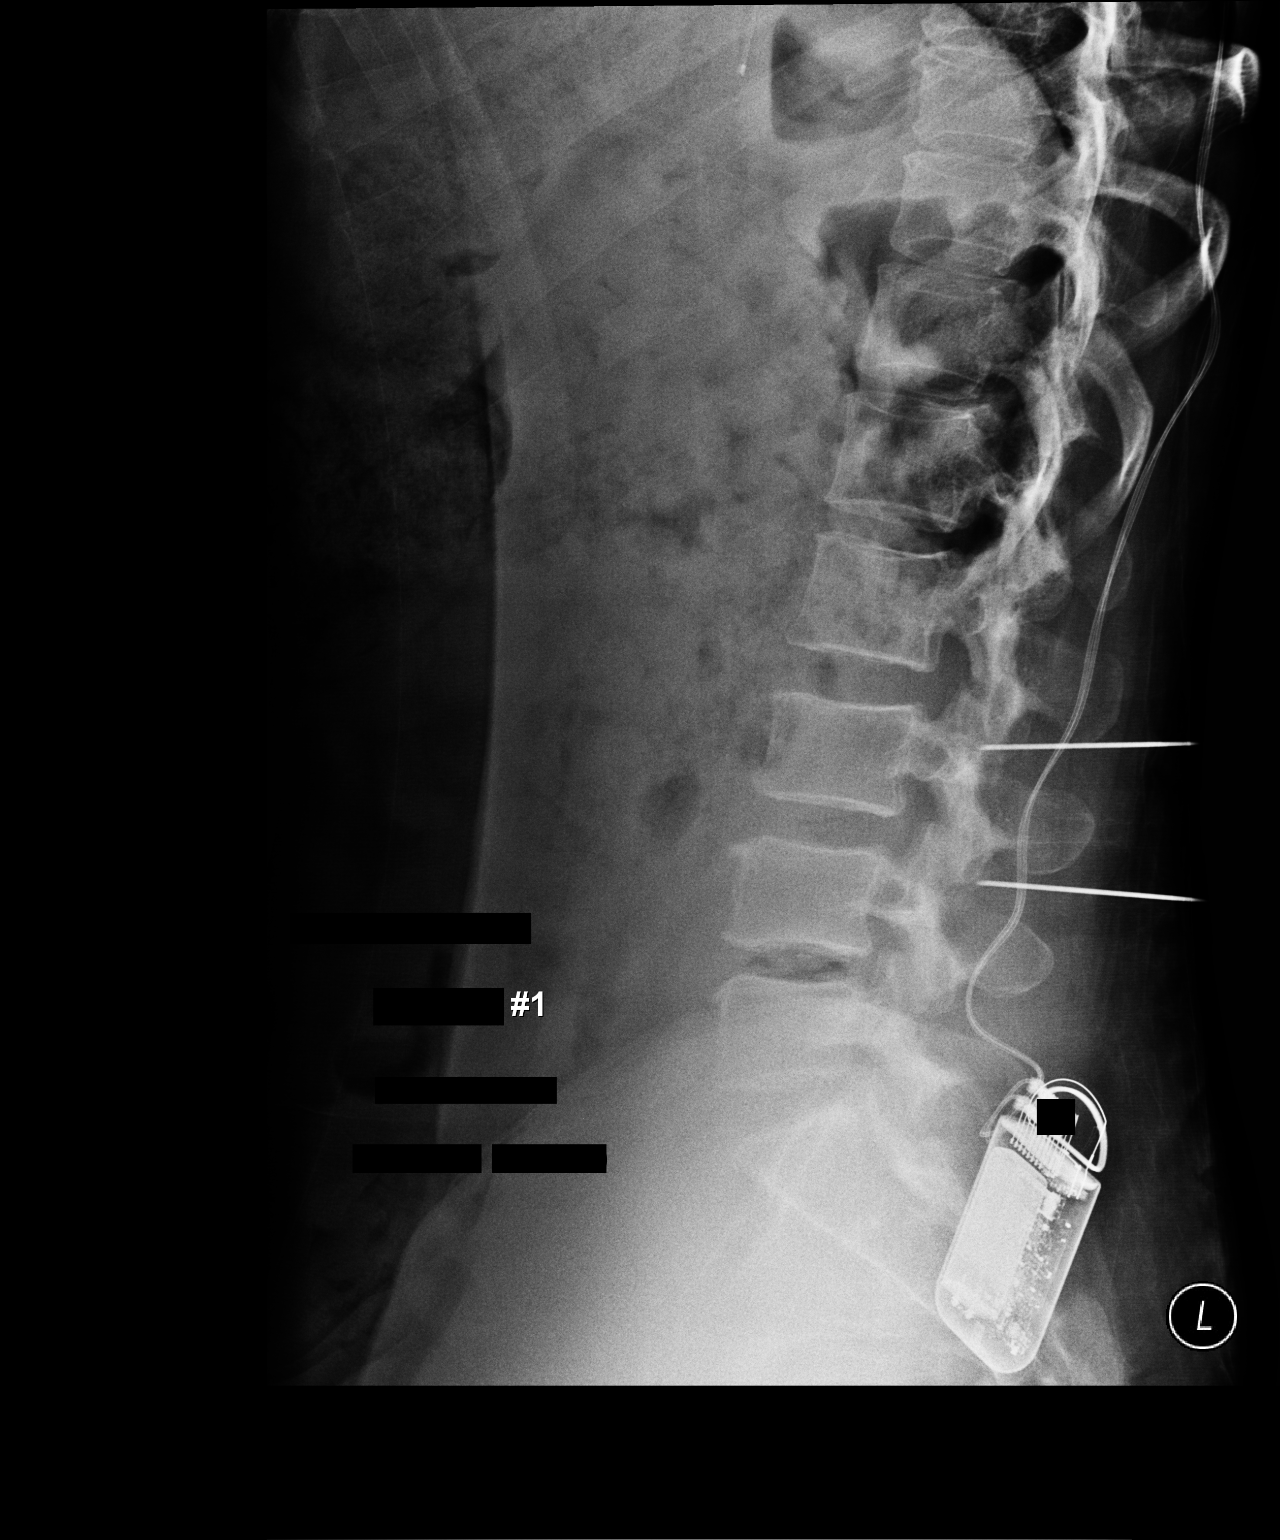

[1 of 1 positions shown; findings below may reference images not displayed]

FINDINGS: Radiograph 1 at [DATE] a.m. demonstrates a needle at the level of the
pedicles of L3 and a second needle at the level of the pedicles of
L4.
IMPRESSION: Needles at L3 and L4 as described.

## 2021-08-25 IMAGING — CR DG SPINE 1V PORT
1 series · 1 of 1 positions shown · non-contrast
Comparison: None.

CLINICAL DATA: Lumbar decompression

EXAM:
PORTABLE SPINE - 1 VIEW

[lateral]
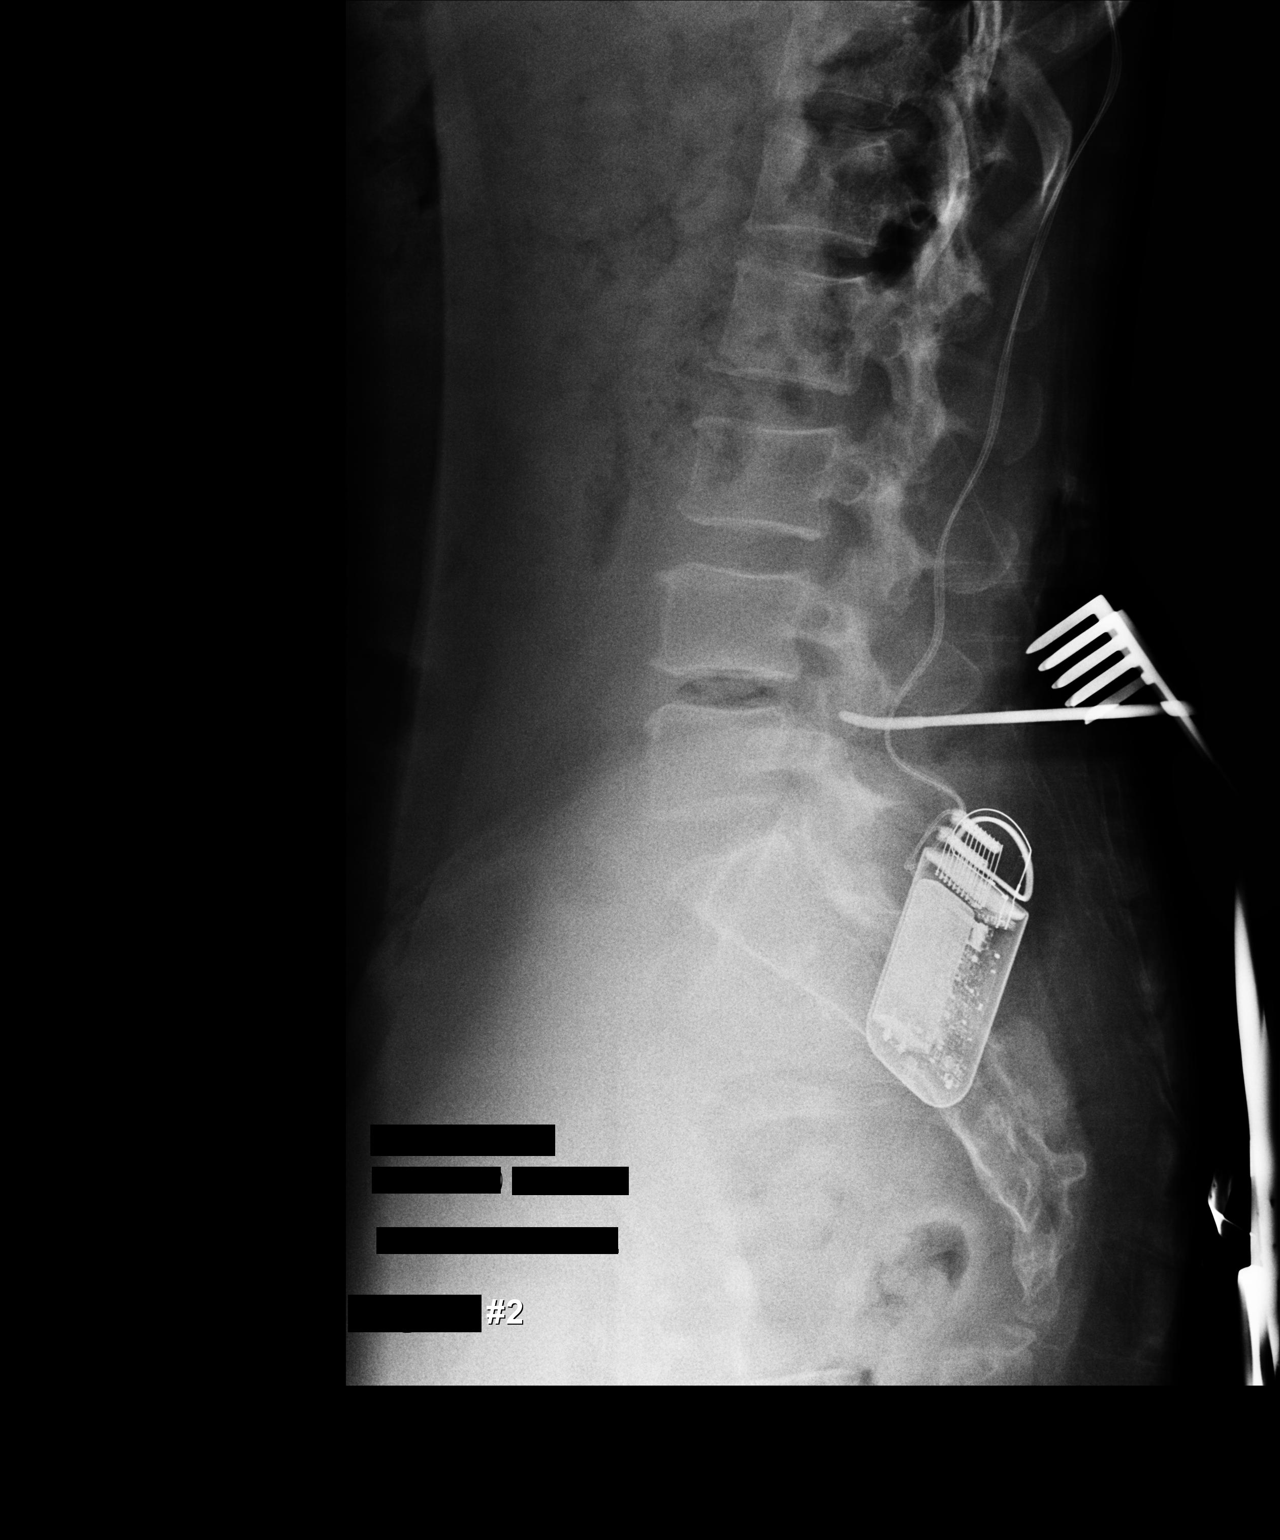

[1 of 1 positions shown; findings below may reference images not displayed]

FINDINGS: Lateral view of the lumbar spine was obtained with no prior
comparison film. A surgical instrument and retractors are noted at
the L4-5 interspace with vacuum disc phenomenon at that level.
Numbering nomenclature is standard numbering nomenclature is no
prior exams available for comparison.
IMPRESSION: Lumbar localization at L4-5.

## 2021-09-06 DIAGNOSIS — G90519 Complex regional pain syndrome I of unspecified upper limb: Secondary | ICD-10-CM | POA: Diagnosis not present

## 2021-09-06 DIAGNOSIS — M5416 Radiculopathy, lumbar region: Secondary | ICD-10-CM | POA: Diagnosis not present

## 2021-09-06 DIAGNOSIS — M25551 Pain in right hip: Secondary | ICD-10-CM | POA: Diagnosis not present

## 2021-09-06 DIAGNOSIS — G894 Chronic pain syndrome: Secondary | ICD-10-CM | POA: Diagnosis not present

## 2021-09-06 DIAGNOSIS — M5451 Vertebrogenic low back pain: Secondary | ICD-10-CM | POA: Diagnosis not present

## 2021-09-06 DIAGNOSIS — M25552 Pain in left hip: Secondary | ICD-10-CM | POA: Diagnosis not present

## 2021-10-13 DIAGNOSIS — M47816 Spondylosis without myelopathy or radiculopathy, lumbar region: Secondary | ICD-10-CM | POA: Diagnosis not present

## 2021-10-13 DIAGNOSIS — M25552 Pain in left hip: Secondary | ICD-10-CM | POA: Diagnosis not present

## 2021-10-13 DIAGNOSIS — G8929 Other chronic pain: Secondary | ICD-10-CM | POA: Diagnosis not present

## 2021-10-13 DIAGNOSIS — M25551 Pain in right hip: Secondary | ICD-10-CM | POA: Diagnosis not present

## 2021-10-13 DIAGNOSIS — Z79899 Other long term (current) drug therapy: Secondary | ICD-10-CM | POA: Diagnosis not present

## 2021-10-13 DIAGNOSIS — Z5181 Encounter for therapeutic drug level monitoring: Secondary | ICD-10-CM | POA: Diagnosis not present

## 2021-11-01 DIAGNOSIS — R601 Generalized edema: Secondary | ICD-10-CM | POA: Diagnosis not present

## 2021-11-01 DIAGNOSIS — E039 Hypothyroidism, unspecified: Secondary | ICD-10-CM | POA: Diagnosis not present

## 2021-11-01 DIAGNOSIS — J45909 Unspecified asthma, uncomplicated: Secondary | ICD-10-CM | POA: Diagnosis not present

## 2021-11-01 DIAGNOSIS — E1165 Type 2 diabetes mellitus with hyperglycemia: Secondary | ICD-10-CM | POA: Diagnosis not present

## 2021-11-08 DIAGNOSIS — E669 Obesity, unspecified: Secondary | ICD-10-CM | POA: Diagnosis not present

## 2021-11-08 DIAGNOSIS — E039 Hypothyroidism, unspecified: Secondary | ICD-10-CM | POA: Diagnosis not present

## 2021-11-08 DIAGNOSIS — F341 Dysthymic disorder: Secondary | ICD-10-CM | POA: Diagnosis not present

## 2021-11-08 DIAGNOSIS — F909 Attention-deficit hyperactivity disorder, unspecified type: Secondary | ICD-10-CM | POA: Diagnosis not present

## 2021-11-08 DIAGNOSIS — G9050 Complex regional pain syndrome I, unspecified: Secondary | ICD-10-CM | POA: Diagnosis not present

## 2021-11-08 DIAGNOSIS — J45909 Unspecified asthma, uncomplicated: Secondary | ICD-10-CM | POA: Diagnosis not present

## 2021-11-08 DIAGNOSIS — E248 Other Cushing's syndrome: Secondary | ICD-10-CM | POA: Diagnosis not present

## 2021-11-08 DIAGNOSIS — E1165 Type 2 diabetes mellitus with hyperglycemia: Secondary | ICD-10-CM | POA: Diagnosis not present

## 2021-12-04 ENCOUNTER — Emergency Department (HOSPITAL_COMMUNITY)
Admission: EM | Admit: 2021-12-04 | Discharge: 2021-12-04 | Disposition: A | Discharge status: Home / Self Care | Payer: PPO | Attending: Emergency Medicine | Admitting: Emergency Medicine

## 2021-12-04 ENCOUNTER — Other Ambulatory Visit: Payer: Self-pay

## 2021-12-04 ENCOUNTER — Emergency Department (HOSPITAL_COMMUNITY): Payer: PPO

## 2021-12-04 ENCOUNTER — Encounter (HOSPITAL_COMMUNITY): Payer: Self-pay

## 2021-12-04 DIAGNOSIS — W01198A Fall on same level from slipping, tripping and stumbling with subsequent striking against other object, initial encounter: Secondary | ICD-10-CM | POA: Insufficient documentation

## 2021-12-04 DIAGNOSIS — H538 Other visual disturbances: Secondary | ICD-10-CM | POA: Insufficient documentation

## 2021-12-04 DIAGNOSIS — S0990XA Unspecified injury of head, initial encounter: Secondary | ICD-10-CM | POA: Diagnosis not present

## 2021-12-04 DIAGNOSIS — M25551 Pain in right hip: Secondary | ICD-10-CM | POA: Diagnosis not present

## 2021-12-04 DIAGNOSIS — M79601 Pain in right arm: Secondary | ICD-10-CM | POA: Insufficient documentation

## 2021-12-04 DIAGNOSIS — R519 Headache, unspecified: Secondary | ICD-10-CM | POA: Insufficient documentation

## 2021-12-04 DIAGNOSIS — S199XXA Unspecified injury of neck, initial encounter: Secondary | ICD-10-CM | POA: Diagnosis not present

## 2021-12-04 LAB — BASIC METABOLIC PANEL WITH GFR
Anion gap: 8 (ref 5–15)
BUN: 15 mg/dL (ref 6–20)
CO2: 27 mmol/L (ref 22–32)
Calcium: 9.1 mg/dL (ref 8.9–10.3)
Chloride: 106 mmol/L (ref 98–111)
Creatinine, Ser: 0.84 mg/dL (ref 0.44–1.00)
GFR, Estimated: >60 mL/min
Glucose, Bld: 93 mg/dL (ref 70–99)
Potassium: 3.9 mmol/L (ref 3.5–5.1)
Sodium: 141 mmol/L (ref 135–145)

## 2021-12-04 LAB — CBC WITH DIFFERENTIAL/PLATELET
Abs Immature Granulocytes: 0.07 K/uL (ref 0.00–0.07)
Basophils Absolute: 0 K/uL (ref 0.0–0.1)
Basophils Relative: 0 %
Eosinophils Absolute: 0.3 K/uL (ref 0.0–0.5)
Eosinophils Relative: 3 %
HCT: 39.3 % (ref 36.0–46.0)
Hemoglobin: 13.9 g/dL (ref 12.0–15.0)
Immature Granulocytes: 1 %
Lymphocytes Relative: 15 %
Lymphs Abs: 1.4 K/uL (ref 0.7–4.0)
MCH: 32.3 pg (ref 26.0–34.0)
MCHC: 35.4 g/dL (ref 30.0–36.0)
MCV: 91.4 fL (ref 80.0–100.0)
Monocytes Absolute: 0.5 K/uL (ref 0.1–1.0)
Monocytes Relative: 6 %
Neutro Abs: 7.1 K/uL (ref 1.7–7.7)
Neutrophils Relative %: 75 %
Platelets: 271 K/uL (ref 150–400)
RBC: 4.3 MIL/uL (ref 3.87–5.11)
RDW: 11.3 % — ABNORMAL LOW (ref 11.5–15.5)
WBC: 9.5 K/uL (ref 4.0–10.5)
nRBC: 0 % (ref 0.0–0.2)

## 2021-12-04 LAB — MAGNESIUM: Magnesium: 2.1 mg/dL (ref 1.7–2.4)

## 2021-12-04 MED ORDER — ACETAMINOPHEN 500 MG PO TABS
1000.0000 mg | ORAL_TABLET | Freq: Four times a day (QID) | ORAL | Status: DC | PRN
  Administered 2021-12-04: 1000 mg via ORAL
  Filled 2021-12-04: qty 2

## 2021-12-04 MED ORDER — DIPHENHYDRAMINE HCL 50 MG/ML IJ SOLN
50.0000 mg | Freq: Once | INTRAMUSCULAR | Status: AC
  Administered 2021-12-04: 50 mg via INTRAVENOUS
  Filled 2021-12-04: qty 1

## 2021-12-04 MED ORDER — METOCLOPRAMIDE HCL 5 MG/ML IJ SOLN
10.0000 mg | Freq: Once | INTRAMUSCULAR | Status: AC
  Administered 2021-12-04: 10 mg via INTRAVENOUS
  Filled 2021-12-04: qty 2

## 2021-12-04 MED ORDER — SODIUM CHLORIDE 0.9 % IV BOLUS
1000.0000 mL | Freq: Once | INTRAVENOUS | Status: AC
  Administered 2021-12-04: 1000 mL via INTRAVENOUS

## 2021-12-04 MED ORDER — MAGNESIUM SULFATE 2 GM/50ML IV SOLN
2.0000 g | Freq: Once | INTRAVENOUS | Status: AC
  Administered 2021-12-04: 2 g via INTRAVENOUS
  Filled 2021-12-04: qty 50

## 2021-12-04 NOTE — Discharge Instructions (Addendum)
You have been seen today for your complaint of headache after a fall. Your lab work was reassuring and showed no abnormalities. Your imaging was reassuring and showed no intracranial or cervical abnormalities. Home care instructions are as follows:  You should continue home remedies for your headaches as you have been including alternating Tylenol and ibuprofen every 4 hours throughout the day, hydrating well, resting in a cool, dark and quiet room, applying heat or ice to your neck Follow up with: Your primary care provider as soon as possible.  You may see any primary care provider you would like. Please seek immediate medical care if you develop any of the following symptoms: Your migraine becomes severe and medicine does not help. You have a stiff neck and fever. You have a loss of vision. You have muscle weakness or loss of muscle control. You start losing your balance, or you have trouble walking. You feel like you may faint, or you faint. You start having sudden and unexpected, severe headaches. You have a seizure. At this time there does not appear to be the presence of an emergent medical condition, however there is always the potential for conditions to change. Please read and follow the below instructions.  Do not take your medicine if  develop an itchy rash, swelling in your mouth or lips, or difficulty breathing; call 911 and seek immediate emergency medical attention if this occurs.  You may review your lab tests and imaging results in their entirety on your MyChart account.  Please discuss all results of fully with your primary care provider and other specialist at your follow-up visit.  Note: Portions of this text may have been transcribed using voice recognition software. Every effort was made to ensure accuracy; however, inadvertent computerized transcription errors may still be present.

## 2021-12-04 NOTE — ED Provider Notes (Signed)
[facility] EMERGENCY DEPARTMENT Provider Note   CSN: [id number] Arrival date & time: 12/04/21  1253     History  Chief Complaint  Patient presents with   Migraine   Dizziness    Tanya [name] is a 51 y.o. female.  With history of complex regional pain syndrome, lumbar radiculopathy, anxiety, migraines who presents ED for evaluation of a fall.  She states she was walking towards the kitchen 6 days ago when her pants fell down and she tripped and fell into the cupboard.  She states she struck the left side of of her head, her shoulder, and then fell to her right hip.  Did not lose consciousness.  Not on blood thinners.  States she immediately felt a headache and had blurred vision.  Took ibuprofen 800 mg shortly afterwards with minimal relief of her symptoms.  States symptoms began to improve Tuesday night, but then she woke up with headache and blurry vision again on Wednesday morning.  Does report some retrograde amnesia and cannot fully remember what happened the day after the fall.  Describes a headache as behind bilateral eyes and encompassing the right side of her head.  Reports her vision looks like she was looking through a see-through curtain.  Denies dizziness, but does state that she occasionally feels like she will fall when she is walking.  Describes the pain as a throb.  States it is not the worst headache of her life.  Reports some nausea without vomiting.  Denies numbness, weakness or tingling.  Patient last took 800 mg of ibuprofen approximately 4 hours prior to arrival.  Patient states she takes oral magnesium for prevention of migraines.   Migraine Associated symptoms include headaches.  Dizziness Associated symptoms: headaches and nausea        Home Medications Prior to Admission medications   Medication Sig Start Date End Date Taking? Authorizing Provider  albuterol (VENTOLIN HFA) 108 (90 Base) MCG/ACT inhaler Inhale 2 puffs into the lungs every 6  (six) hours as needed for wheezing or shortness of breath. 04/23/19   Fransico Meadow, PA-C  ALPRAZolam (XANAX XR) 1 MG 24 hr tablet Take 1 mg by mouth in the morning, at noon, and at bedtime.    [provider]  amoxicillin (AMOXIL) 500 MG capsule amoxicillin 500 mg capsule  TAKE 1 CAPSULE BY MOUTH EVERY 8 HOURS FOR 7 DAYS    [provider]  amphetamine-dextroamphetamine (ADDERALL XR) 20 MG 24 hr capsule Take 20 mg by mouth daily.  03/29/16   [provider]  amphetamine-dextroamphetamine (ADDERALL) 20 MG tablet Take 20 mg by mouth See admin instructions. Take 1 tablet every mroning and take 1 tablet at 1300.    [provider]  b complex vitamins capsule Take 1 capsule by mouth daily.    [provider]  buPROPion (WELLBUTRIN XL) 150 MG 24 hr tablet Take 150 mg by mouth See admin instructions. Take 1 tablet in the morning and take 1 tablet at 1300 NAME BRAND ONLY    [provider]  buPROPion (WELLBUTRIN XL) 300 MG 24 hr tablet Take 300 mg by mouth daily. NAME BRAND ONLY    [provider]  cetirizine (ZYRTEC) 10 MG tablet Take 10 mg by mouth at bedtime.    [provider]  clobetasol ointment (TEMOVATE) 0.05 % Apply to affected areas twice daily Patient taking differently: Apply 1 application topically 2 (two) times daily. Apply to affected areas twice daily 12/19/19  Moye, [place], MD  doxepin (SINEQUAN) 50 MG capsule Take 150 mg by mouth at bedtime.  11/11/17   [provider]  estazolam (PROSOM) 2 MG tablet Take 2 mg by mouth at bedtime.    [provider]  HYDROmorphone (DILAUDID) 2 MG tablet Take 0.5 tablets (1 mg total) by mouth every 3 (three) hours as needed for severe pain. 02/13/20   Gae Dry, MD  levothyroxine (SYNTHROID) 75 MCG tablet TAKE 1 TABLET EVERY DAY ON EMPTY STOMACHWITH A GLASS OF WATER AT LEAST 30-60 MINBEFORE BREAKFAST 10/17/20   Pleas Koch, NP  melatonin 5 MG TABS Take 5 mg  by mouth at bedtime as needed (sleep).    [provider]  metFORMIN (GLUCOPHAGE) 500 MG tablet Take 500 mg by mouth 2 (two) times daily with a meal. Take 1 tablet every morning and take 1 tablet at 1300.    [provider]  Multiple Vitamins-Minerals (MULTIVITAMIN WITH MINERALS) tablet Take 1 tablet by mouth daily.    [provider]  mupirocin ointment (BACTROBAN) 2 % Apply 1 application topically daily. To open areas and cover with band aid. 12/03/20   Moye, [place], MD  NALTREXONE HCL PO Take 6 mg by mouth daily.     [provider]  orlistat (ALLI) 60 MG capsule Take 120 mg by mouth 3 (three) times daily as needed (constipation). Do not exceed 3 capsules daily.    [provider]  polyethylene glycol (MIRALAX / GLYCOLAX) 17 g packet Take 17 g by mouth daily as needed for moderate constipation.    [provider]  spironolactone (ALDACTONE) 25 MG tablet Take 1 tablet (25 mg total) by mouth daily. For blood pressure. 01/21/20   Pleas Koch, NP  tiaGABine (GABITRIL) 4 MG tablet Take 16 mg by mouth at bedtime.    [provider]  tretinoin (RETIN-A) 0.025 % cream Apply 1 application topically at bedtime.    [provider]  venlafaxine XR (EFFEXOR-XR) 150 MG 24 hr capsule Take 300 mg by mouth daily with breakfast.  11/16/17   [provider]  VICTOZA 18 MG/3ML SOPN Inject 1.8 mg into the skin daily.  09/02/19   [provider]      Allergies    Hydrocodone-acetaminophen, Oxycodone-acetaminophen, and Pertussis vaccines    Review of Systems   Review of Systems  Eyes:  Positive for visual disturbance.  Gastrointestinal:  Positive for nausea.  Neurological:  Positive for headaches.  All other systems reviewed and are negative.   Physical Exam Updated Vital Signs BP 107/69   Pulse 75   Temp (!) 97.4 F (36.3 C) (Oral)   Resp 17   Ht 5' (1.524 m)   Wt 70.3 kg   LMP  (LMP Unknown)   SpO2 98%    BMI 30.27 kg/m  Physical Exam Vitals and nursing note reviewed.  Constitutional:      General: She is not in acute distress.    Appearance: Normal appearance. She is normal weight. She is not ill-appearing.     Comments: On initial evaluation patient had room lights off and was covering her eyes with a sleeping mask.  HENT:     Head: Normocephalic and atraumatic.  Eyes:     Extraocular Movements: Extraocular movements intact.     Pupils: Pupils are equal, round, and reactive to light.  Cardiovascular:     Rate and Rhythm: Normal rate and regular rhythm.  Pulmonary:     Effort: Pulmonary  effort is normal. No respiratory distress.  Abdominal:     General: Abdomen is flat.  Musculoskeletal:        General: Normal range of motion.     Cervical back: Normal range of motion and neck supple. Spinous process tenderness and muscular tenderness present.     Comments: Chronic pain to the right upper extremity  Skin:    General: Skin is warm and dry.  Neurological:     General: No focal deficit present.     Mental Status: She is alert and oriented to person, place, and time.     Comments: No facial droop, slurred speech, pronator drift, unilateral or global weakness.  Heel-to-shin normal.  Sensation normal.  Psychiatric:        Mood and Affect: Mood normal.        Behavior: Behavior normal.     ED Results / Procedures / Treatments   Labs (all labs ordered are listed, but only abnormal results are displayed) Labs Reviewed  BASIC METABOLIC PANEL  CBC WITH DIFFERENTIAL/PLATELET    EKG None  Radiology No results found.  Procedures Procedures    Medications Ordered in ED Medications - No data to display  ED Course/ Medical Decision Making/ A&P Clinical Course as of 12/04/21 1828  Sat Dec 04, 2021  1816 CT Cervical Spine Wo Contrast I personally reviewed and interpreted the image.  No fractures or dislocations [AS]  1816 CT Head Wo Contrast I personally reviewed the image.   No acute intracranial abnormality [AS]  1816 Upon reevaluation, patient reports her pain has improved and is now 6 out of 10. [AS]    Clinical Course User Index [AS] Jaqua Ching, [place] Folk, PA-C                           Medical Decision Making Amount and/or Complexity of Data Reviewed Labs: ordered. Radiology: ordered.  Risk Prescription drug management.  This patient presents to the ED for concern of headache, this involves an extensive number of treatment options, and is a complaint that carries with it a high risk of complications and morbidity.  Emergent considerations for headache include subarachnoid hemorrhage, meningitis, temporal arteritis, glaucoma, cerebral ischemia, carotid/vertebral dissection, intracranial tumor, Venous sinus thrombosis, carbon monoxide poisoning, acute or chronic subdural hemorrhage.  Other considerations include: Migraine, Cluster headache, Hypertension, Caffeine, alcohol, or drug withdrawal, Pseudotumor cerebri, Arteriovenous malformation, Head injury, Neurocysticercosis, Post-lumbar puncture, Preeclampsia, Tension headache, Sinusitis, Cervical arthritis, Refractive error causing strain, Dental abscess, Otitis media, Temporomandibular joint syndrome, Depression, Somatoform disorder (eg, somatization) Trigeminal neuralgia, Glossopharyngeal neuralgia.    Co morbidities that complicate the patient evaluation  History of migraines, complex regional pain syndrome, anxiety, depression  My initial workup includes basic labs, CT head, CT C-spine  Additional history obtained from: Nursing notes from this visit. Previous records within EMR system ED visit on 09/23/2019 for evaluation of migraine.  Patient received headache cocktail urgent care then presented to the ED for further evaluation of the perceived IV magnesium and dihydroergotamine with significant improvement. Family son is present and provides a portion of the history  I ordered, reviewed and interpreted  labs which include: CBC, BMP, magnesium.  All labs within normal limits.   I ordered imaging studies including CT neck due to C-spine tenderness, CT head due to prolonged symptoms I independently visualized and interpreted imaging which showed no acute intracranial or cervical abnormalities I agree with the radiologist interpretation  Cardiac Monitoring:  The  patient was maintained on a cardiac monitor.  I personally viewed and interpreted the cardiac monitored which showed an underlying rhythm of: NSR  Afebrile, hemodynamically stable.  Patient is a 51 year old female with a history of migraines who presents to the ED for evaluation of 6 days of migraine type headache.  She reportedly fell forward and hit her head on a cupboard 6 days ago and immediately started to feel the pain.  Pain did resolve for small amount of time but then returned.  Physical exam largely unremarkable.  Specifically, neurological examination including strength, sensation, coordination was completely benign.  Patient did have some retrograde amnesia, CT head showed no intracranial abnormalities.  Patient did have some cervical spine tenderness, CT cervical spine unremarkable.  Patient was given IV Benadryl, Reglan, magnesium and fluids.  She reported improvement in her pain from an 8 out of 10 to a 6 out of 10.  Patient was given 1000 mg of Tylenol after this and reported no change in her pain level, but was requesting to leave approximately 10 minutes after administration.  Patient had taking 800 mg of ibuprofen approximately 4 hours prior to arrival and we did not want to repeat NSAIDs that early during her stay.  She also reports allergies to codeine, oxycodone, hydrocodone and states that she typically gets Dilaudid for pain.  We will not give Dilaudid for headache pain, thus Tylenol was ordered.  Patient may continue home therapies for migraine type headaches.  Patient was given strict return precautions.  She was also  informed to follow-up with her primary care provider regarding migraine treatment and prevention, however she states she does not like her current primary care provider and is seeking a new one.  Informed her that she may see anyone she would like for her primary care and that she should find a new provider that is what she needs so that she may follow-up about her migraines stable at discharge.  At this time there does not appear to be any evidence of an acute emergency medical condition and the patient appears stable for discharge with appropriate outpatient follow up. Diagnosis was discussed with patient who verbalizes understanding of care plan and is agreeable to discharge. I have discussed return precautions with patient and son who verbalizes understanding. Patient encouraged to follow-up with their PCP within 1 week. All questions answered.  Patient's case discussed with Dr. Pearline Cables who agrees with plan to discharge with follow-up.   Note: Portions of this report may have been transcribed using voice recognition software. Every effort was made to ensure accuracy; however, inadvertent computerized transcription errors may still be present.          Final Clinical Impression(s) / ED Diagnoses Final diagnoses:  None    Rx / DC Orders ED Discharge Orders     None         Roylene Reason, PA-C 48/18/56 3149    Lianne Cure, DO 70/26/37 [phone]

## 2021-12-04 NOTE — ED Triage Notes (Signed)
Pt states she tripped and fell and struck her head on some cabinets Sunday. Pt denies loc during the fall, no blood thinners, and is currently AxOx4. PT reports since the fall, she has been experiencing a migraine and some dizziness. Pupils are equal, round and reactive.

## 2022-01-06 DIAGNOSIS — M5451 Vertebrogenic low back pain: Secondary | ICD-10-CM | POA: Diagnosis not present

## 2022-01-06 DIAGNOSIS — G894 Chronic pain syndrome: Secondary | ICD-10-CM | POA: Diagnosis not present

## 2022-01-06 DIAGNOSIS — M791 Myalgia, unspecified site: Secondary | ICD-10-CM | POA: Diagnosis not present

## 2022-01-06 DIAGNOSIS — Z5181 Encounter for therapeutic drug level monitoring: Secondary | ICD-10-CM | POA: Diagnosis not present

## 2022-01-06 DIAGNOSIS — M5416 Radiculopathy, lumbar region: Secondary | ICD-10-CM | POA: Diagnosis not present

## 2022-01-06 DIAGNOSIS — G90519 Complex regional pain syndrome I of unspecified upper limb: Secondary | ICD-10-CM | POA: Diagnosis not present

## 2022-02-14 ENCOUNTER — Telehealth: Payer: Self-pay

## 2022-02-14 ENCOUNTER — Emergency Department (HOSPITAL_COMMUNITY): Payer: PPO

## 2022-02-14 ENCOUNTER — Other Ambulatory Visit: Payer: Self-pay

## 2022-02-14 ENCOUNTER — Emergency Department (HOSPITAL_COMMUNITY)
Admission: EM | Admit: 2022-02-14 | Discharge: 2022-02-14 | Disposition: A | Discharge status: Home / Self Care | Payer: PPO | Attending: Emergency Medicine | Admitting: Emergency Medicine

## 2022-02-14 DIAGNOSIS — Z7951 Long term (current) use of inhaled steroids: Secondary | ICD-10-CM | POA: Insufficient documentation

## 2022-02-14 DIAGNOSIS — J45909 Unspecified asthma, uncomplicated: Secondary | ICD-10-CM | POA: Diagnosis not present

## 2022-02-14 DIAGNOSIS — R110 Nausea: Secondary | ICD-10-CM

## 2022-02-14 DIAGNOSIS — R1011 Right upper quadrant pain: Secondary | ICD-10-CM | POA: Insufficient documentation

## 2022-02-14 LAB — URINALYSIS, ROUTINE W REFLEX MICROSCOPIC
Bilirubin Urine: NEGATIVE
Glucose, UA: NEGATIVE mg/dL
Hgb urine dipstick: NEGATIVE
Ketones, ur: NEGATIVE mg/dL
Leukocytes,Ua: NEGATIVE
Nitrite: NEGATIVE
Protein, ur: NEGATIVE mg/dL
Specific Gravity, Urine: 1.02 (ref 1.005–1.030)
pH: 6.5 (ref 5.0–8.0)

## 2022-02-14 LAB — CBC
HCT: 40.9 % (ref 36.0–46.0)
Hemoglobin: 13.8 g/dL (ref 12.0–15.0)
MCH: 31.2 pg (ref 26.0–34.0)
MCHC: 33.7 g/dL (ref 30.0–36.0)
MCV: 92.3 fL (ref 80.0–100.0)
Platelets: 281 10*3/uL (ref 150–400)
RBC: 4.43 MIL/uL (ref 3.87–5.11)
RDW: 11.8 % (ref 11.5–15.5)
WBC: 8.4 10*3/uL (ref 4.0–10.5)
nRBC: 0 % (ref 0.0–0.2)

## 2022-02-14 LAB — COMPREHENSIVE METABOLIC PANEL
ALT: 34 U/L (ref 0–44)
AST: 20 U/L (ref 15–41)
Albumin: 4 g/dL (ref 3.5–5.0)
Alkaline Phosphatase: 65 U/L (ref 38–126)
Anion gap: 6 (ref 5–15)
BUN: 14 mg/dL (ref 6–20)
CO2: 24 mmol/L (ref 22–32)
Calcium: 8.7 mg/dL — ABNORMAL LOW (ref 8.9–10.3)
Chloride: 106 mmol/L (ref 98–111)
Creatinine, Ser: 0.85 mg/dL (ref 0.44–1.00)
GFR, Estimated: >60 mL/min (ref >60)
Glucose, Bld: 106 mg/dL — ABNORMAL HIGH (ref 70–99)
Potassium: 3.9 mmol/L (ref 3.5–5.1)
Sodium: 136 mmol/L (ref 135–145)
Total Bilirubin: 0.4 mg/dL (ref 0.3–1.2)
Total Protein: 6.4 g/dL — ABNORMAL LOW (ref 6.5–8.1)

## 2022-02-14 LAB — LIPASE, BLOOD: Lipase: 43 U/L (ref 11–51)

## 2022-02-14 MED ORDER — ONDANSETRON 4 MG PO TBDP
8.0000 mg | ORAL_TABLET | Freq: Once | ORAL | Status: AC
  Administered 2022-02-14: 8 mg via ORAL
  Filled 2022-02-14: qty 2

## 2022-02-14 MED ORDER — ONDANSETRON 4 MG PO TBDP: 4.0000 mg | ORAL_TABLET | Freq: Three times a day (TID) | ORAL | 0 refills | Status: DC | PRN

## 2022-02-14 MED ORDER — KETOROLAC TROMETHAMINE 60 MG/2ML IM SOLN
60.0000 mg | Freq: Once | INTRAMUSCULAR | Status: AC
  Administered 2022-02-14: 60 mg via INTRAMUSCULAR
  Filled 2022-02-14: qty 2

## 2022-02-14 MED ORDER — OMEPRAZOLE 20 MG PO CPDR: 20.0000 mg | DELAYED_RELEASE_CAPSULE | Freq: Every day | ORAL | 0 refills | Status: DC

## 2022-02-14 NOTE — ED Provider Triage Note (Signed)
Emergency Medicine Provider Triage Evaluation Note  Reggy Eye , a 51 y.o. female  was evaluated in triage.  Pt complains of right upper quadrant pain which began on Wednesday.  Patient denies any history of gallbladder issues but states that she has been trying a bland diet since this began.  She states that she has been extremely nauseated but has not had emesis.  She denies shortness of breath, chest pain, urinary symptoms. Patient with complex regional pain syndrome, has pain stimulator  Review of Systems  Positive: As above Negative: As above  Physical Exam  BP 131/71   Pulse 89   Temp 98.3 F (36.8 C)   Resp 12   LMP  (LMP Unknown)   SpO2 99%  Gen:   Awake, no distress   Resp:  Normal effort  MSK:   Moves extremities without difficulty  Other:  Positive Murphy sign  Medical Decision Making  Medically screening exam initiated at 11:50 AM.  Appropriate orders placed.  Reggy Eye was informed that the remainder of the evaluation will be completed by another provider, this initial triage assessment does not replace that evaluation, and the importance of remaining in the ED until their evaluation is complete.     Dorothyann Peng, PA-C 02/14/22 1151

## 2022-02-14 NOTE — Telephone Encounter (Signed)
Unable to reach pt by phone; I spoke with pts husband and he said pt is presently on route to [facility] ED with same abd pain. Sending note to Gentry Fitz NP who is out of office, Romilda Garret NP who is in office and Mark pool. Per chart review tab pt last saw Gentry Fitz NP for annual exam on 01/21/2020.

## 2022-02-14 NOTE — Telephone Encounter (Signed)
[place] Night - Client TELEPHONE ADVICE RECORD [facility] Patient Name: Tanya [name] Gender: Female DOB: [birth year] Age: 51 Y 45 M 13 D Return Phone Number: [phone] (Primary), [phone] (Secondary) Address: [name] [address] Client [place] Night - Client Client Site [place] - Night Provider Alma Friendly - NP Contact Type Call Who Is Calling Patient / Member / Family / Caregiver Call Type Triage / Clinical Relationship To Patient Self Return Phone Number [phone] (Secondary) Chief Complaint SEVERE ABDOMINAL PAIN - Severe pain in abdomen Reason for Call Symptomatic / Request for [name] states she has been having gallstone and gallbladder issues, she has had this problem since Wednesday. She is having severe pain. Translation No Nurse Assessment Nurse: Windle Guard, RN, Lesa Date/Time (Eastern Time): 02/13/2022 5:45:54 PM Confirm and document reason for call. If symptomatic, describe symptoms. ---Caller states she has RUQ pain. Does the patient have any new or worsening symptoms? ---Yes Will a triage be completed? ---Yes Related visit to physician within the last 2 weeks? ---No Does the PT have any chronic conditions? (i.e. diabetes, asthma, this includes High risk factors for pregnancy, etc.) ---Yes List chronic conditions. ---PTSD, complex regional pain syndrome, chronic pain, Is the patient pregnant or possibly pregnant? (Ask all females between the ages of 14-55) ---No Is this a behavioral health or substance abuse call? ---No Guidelines Guideline Title Affirmed Question Affirmed Notes Nurse Date/Time Eilene Ghazi Time) Abdominal Pain - Upper [1] SEVERE pain (e.g., excruciating) AND [2] present > 1 hour Conner, RN, Lesa 02/13/2022 5:49:12 PM PLEASE NOTE: All timestamps contained within this report are represented as  [place] Standard Time. CONFIDENTIALTY NOTICE: This fax transmission is intended only for the addressee. It contains information that is legally privileged, confidential or otherwise protected from use or disclosure. If you are not the intended recipient, you are strictly prohibited from reviewing, disclosing, copying using or disseminating any of this information or taking any action in reliance on or regarding this information. If you have received this fax in error, please notify us immediately by telephone so that we can arrange for its return to [place]. Phone: [phone], Toll-Free: [phone], Fax: [phone] Page: 2 of 2 Call Id: [id number] [place]. Time Eilene Ghazi Time) Disposition Final User 02/13/2022 5:44:37 PM Send to Urgent [place] 02/13/2022 5:53:37 PM Go to ED Now Yes Windle Guard, RN, Emmaline Kluver Final Disposition 02/13/2022 5:53:37 PM Go to ED Now Yes Windle Guard, RN, Emmaline Kluver Caller Disagree/Comply Disagree Caller Understands Yes PreDisposition [facility] Advice Given Per Guideline GO TO ED NOW: * You need to be seen in the Emergency Department. * Go to the ED at ___________ [place] now. Drive carefully. NOTE TO TRIAGER - DRIVING: * Another adult should drive. BRING MEDICINES: * Bring the pill bottles too. This will help the doctor (or NP/PA) to make certain you are taking the right medicines and the right dose. NOTHING BY MOUTH: * Do not eat or drink anything for now. CARE ADVICE given per Abdominal Pain - Upper (Adult) guideline. Referrals GO TO FACILITY REFUSE

## 2022-02-14 NOTE — ED Triage Notes (Signed)
Pt having RUQ pain and nausea without vomiting since last Wednesday. Patient states she thinks it is her gallbladder.

## 2022-02-14 NOTE — Telephone Encounter (Signed)
Noted. I do see a triage note in the chart from the emergency department

## 2022-02-14 NOTE — ED Provider Notes (Signed)
[facility] EMERGENCY DEPARTMENT Provider Note   CSN: [id number] Arrival date & time: 02/14/22  1058     History  Chief Complaint  Patient presents with   Abdominal Pain    Tanya [name] is a 51 y.o. female. With pmh CRPS, asthma, IBS, oophorectomy and hysterectomy who presents with 6 days of right upper quadrant pain associate with nausea.  She has history of gallstones in her family members but never in her and figured her pain was likely from gallstones or gallbladder related so she has been doing reiki and taking apple cider vinegar daily for her symptoms and avoiding fatty food meals.  However pain was persisting into today so she finally decided to be evaluated at the ER.  She has had no vomiting just nausea.  Pain is worse with laying on certain sides or movement.  She has had no associated fevers, chest pain, shortness of breath, cough or URI symptoms.  No change in normal bowel movements and no urinary symptoms.  She has never had history of kidney stones.   Abdominal Pain      Home Medications Prior to Admission medications   Medication Sig Start Date End Date Taking? Authorizing Provider  omeprazole (PRILOSEC) 20 MG capsule Take 1 capsule (20 mg total) by mouth daily before breakfast. 02/14/22  Yes Elgie Congo, MD  ondansetron (ZOFRAN-ODT) 4 MG disintegrating tablet Take 1 tablet (4 mg total) by mouth every 8 (eight) hours as needed for nausea or vomiting. 02/14/22  Yes Elgie Congo, MD  albuterol (VENTOLIN HFA) 108 (90 Base) MCG/ACT inhaler Inhale 2 puffs into the lungs every 6 (six) hours as needed for wheezing or shortness of breath. 04/23/19   Fransico Meadow, PA-C  ALPRAZolam (XANAX XR) 1 MG 24 hr tablet Take 1 mg by mouth in the morning, at noon, and at bedtime.    [provider]  amoxicillin (AMOXIL) 500 MG capsule amoxicillin 500 mg capsule  TAKE 1 CAPSULE BY MOUTH EVERY 8 HOURS FOR 7 DAYS    [provider]   amphetamine-dextroamphetamine (ADDERALL XR) 20 MG 24 hr capsule Take 20 mg by mouth daily.  03/29/16   [provider]  amphetamine-dextroamphetamine (ADDERALL) 20 MG tablet Take 20 mg by mouth See admin instructions. Take 1 tablet every mroning and take 1 tablet at 1300.    [provider]  b complex vitamins capsule Take 1 capsule by mouth daily.    [provider]  buPROPion (WELLBUTRIN XL) 150 MG 24 hr tablet Take 150 mg by mouth See admin instructions. Take 1 tablet in the morning and take 1 tablet at 1300 NAME BRAND ONLY    [provider]  buPROPion (WELLBUTRIN XL) 300 MG 24 hr tablet Take 300 mg by mouth daily. NAME BRAND ONLY    [provider]  cetirizine (ZYRTEC) 10 MG tablet Take 10 mg by mouth at bedtime.    [provider]  clobetasol ointment (TEMOVATE) 0.05 % Apply to affected areas twice daily Patient taking differently: Apply 1 application topically 2 (two) times daily. Apply to affected areas twice daily 12/19/19   Moye, [place], MD  doxepin (SINEQUAN) 50 MG capsule Take 150 mg by mouth at bedtime.  11/11/17   [provider]  estazolam (PROSOM) 2 MG tablet Take 2 mg by mouth at bedtime.    [provider]  HYDROmorphone (DILAUDID) 2 MG tablet Take 0.5 tablets (1 mg total) by mouth every 3 (three) hours  as needed for severe pain. 02/13/20   Gae Dry, MD  levothyroxine (SYNTHROID) 75 MCG tablet TAKE 1 TABLET EVERY DAY ON EMPTY STOMACHWITH A GLASS OF WATER AT LEAST 30-60 MINBEFORE BREAKFAST 10/17/20   Pleas Koch, NP  melatonin 5 MG TABS Take 5 mg by mouth at bedtime as needed (sleep).    [provider]  metFORMIN (GLUCOPHAGE) 500 MG tablet Take 500 mg by mouth 2 (two) times daily with a meal. Take 1 tablet every morning and take 1 tablet at 1300.    [provider]  Multiple Vitamins-Minerals (MULTIVITAMIN WITH MINERALS) tablet Take 1 tablet by mouth daily.    [provider]  mupirocin ointment (BACTROBAN) 2 % Apply 1 application topically daily. To open areas and cover with band aid. 12/03/20   Moye, [place], MD  NALTREXONE HCL PO Take 6 mg by mouth daily.     [provider]  orlistat (ALLI) 60 MG capsule Take 120 mg by mouth 3 (three) times daily as needed (constipation). Do not exceed 3 capsules daily.    [provider]  polyethylene glycol (MIRALAX / GLYCOLAX) 17 g packet Take 17 g by mouth daily as needed for moderate constipation.    [provider]  spironolactone (ALDACTONE) 25 MG tablet Take 1 tablet (25 mg total) by mouth daily. For blood pressure. 01/21/20   Pleas Koch, NP  tiaGABine (GABITRIL) 4 MG tablet Take 16 mg by mouth at bedtime.    [provider]  tretinoin (RETIN-A) 0.025 % cream Apply 1 application topically at bedtime.    [provider]  venlafaxine XR (EFFEXOR-XR) 150 MG 24 hr capsule Take 300 mg by mouth daily with breakfast.  11/16/17   [provider]  VICTOZA 18 MG/3ML SOPN Inject 1.8 mg into the skin daily.  09/02/19   [provider]      Allergies    Hydrocodone-acetaminophen, Oxycodone-acetaminophen, and Pertussis vaccines    Review of Systems   Review of Systems  Gastrointestinal:  Positive for abdominal pain.    Physical Exam Updated Vital Signs BP 118/72 (BP Location: Left Arm)   Pulse 85   Temp 97.9 F (36.6 C) (Oral)   Resp 15   LMP  (LMP Unknown)   SpO2 99%  Physical Exam Constitutional: Alert and oriented. Well appearing and in no distress. Eyes: Conjunctivae are normal. ENT      Head: Normocephalic and atraumatic.      Nose: No congestion.      Mouth/Throat: Mucous membranes are moist.      Neck: No stridor. Cardiovascular: S1, S2,  Normal and symmetric distal pulses are present in all extremities.Warm and well perfused. Respiratory: Normal respiratory effort. Breath sounds are normal.  O2 sat 99 on RA Gastrointestinal: Soft and right  upper quadrant tenderness no rebound no guarding no CVA tenderness, no appreciable rash Musculoskeletal: Normal range of motion in all extremities.      Right lower leg: No tenderness or edema.      Left lower leg: No tenderness or edema. Neurologic: Normal speech and language. No gross focal neurologic deficits are appreciated. Skin: Skin is warm, dry and intact. No rash noted. Psychiatric: slightly anxious but no acute distress  ED Results / Procedures / Treatments   Labs (all labs ordered are listed, but only abnormal results are displayed) Labs Reviewed  COMPREHENSIVE METABOLIC PANEL - Abnormal; Notable for the following components:      Result Value   Glucose,  Bld 106 (*)    Calcium 8.7 (*)    Total Protein 6.4 (*)    All other components within normal limits  URINALYSIS, ROUTINE W REFLEX MICROSCOPIC - Abnormal; Notable for the following components:   APPearance CLOUDY (*)    All other components within normal limits  LIPASE, BLOOD  CBC    EKG None  Radiology US Abdomen Limited RUQ (LIVER/GB)  Result Date: 02/14/2022 CLINICAL DATA:  Right upper quadrant pain EXAM: ULTRASOUND ABDOMEN LIMITED RIGHT UPPER QUADRANT COMPARISON:  None Available. FINDINGS: Gallbladder: No gallstones or wall thickening visualized. No sonographic Murphy sign noted by sonographer. Common bile duct: Diameter: 4 mm Liver: No focal lesion identified. Within normal limits in parenchymal echogenicity. Portal vein is patent on color Doppler imaging with normal direction of blood flow towards the liver. Other: None. IMPRESSION: Normal right upper quadrant ultrasound. Electronically Signed   By: Valetta Mole M.D.   On: 02/14/2022 12:28    Procedures Procedures    Medications Ordered in ED Medications  ondansetron (ZOFRAN-ODT) disintegrating tablet 8 mg (has no administration in time range)  ketorolac (TORADOL) injection 60 mg (has no administration in time range)    ED Course/ Medical Decision Making/  A&P                           Medical Decision Making  JIOVANNA FREI is a 51 y.o. female. With pmh CRPS, asthma, IBS, oophorectomy and hysterectomy who presents with 6 days of right upper quadrant pain associate with nausea.    Based on the patient's right upper quadrant pain, differential includes but is not limited to cholelithiasis, cholecystitis, hepatitis, GERD, PUD, pancreatitis. Less likely nephrolithiasis or pyelonephritis with no CVAT and no urinary symptoms. Less likely pulmonary source such as pneumonia with no cardiopulmonary complaints, no hypoxia, and no increased work of breathing.  Reviewed patient's labs which were all generally unremarkable and reassuring.  Lipase 43 within normal limits unlikely pancreatitis.  Normal white blood cell count 8.4.  No transaminitis.  Normal total bilirubin 0.4.  Creatinine 0.85 within normal limits.  UA obtained with no hemoglobin or RBCs suggestive of kidney stone and no leukocyte esterase no nitrite no white blood cells suggestive of pyelonephritis or UTI.  Right upper quadrant ultrasound obtained which I personally reviewed showed no evidence of cholelithiasis or cholecystitis and a normal CBD.  Unclear patient's cause of pain, possible MSK or GERD related.  Offered chest x-ray to further evaluate for possible pneumonia although unlikely with no cardiopulmonary symptoms, no increased work of breathing, no hypoxia but patient declined at this time.  Provided Toradol and Zofran for symptom control and discharged with Rx for omeprazole and Zofran and referral to GI.  Discussed strict return precautions and follow-up with GI and return to ED if significant worsening of symptoms uncontrolled vomiting, high fevers and Patient in agreement with plan.   Amount and/or Complexity of Data Reviewed Labs: ordered.  Risk Prescription drug management.   Final Clinical Impression(s) / ED Diagnoses Final diagnoses:  Right upper quadrant abdominal pain   Nausea    Rx / DC Orders ED Discharge Orders          Ordered    ondansetron (ZOFRAN-ODT) 4 MG disintegrating tablet  Every 8 hours PRN        02/14/22 1634    Ambulatory referral to Gastroenterology        02/14/22 1634    omeprazole (PRILOSEC) 20 MG  capsule  Daily before breakfast        02/14/22 1638              Elgie Congo, MD 02/14/22 1646

## 2022-02-14 NOTE — Discharge Instructions (Signed)
  You have been seen in the Emergency Department (ED) for abdominal pain. Your workup was generally reassuring; however, it did not identify a clear cause of your symptoms.  Take Tylenol and Ibuprofen as needed for pain and take the Zofran as needed for nausea or vomiting.   Please follow up with your primary care doctor as soon as possible regarding today's ED visit and the symptoms that are bothering you. We have also referred you to Gastroenterology for further workup of your complaints.  Return to the ED if your abdominal pain worsens or fails to improve, you develop bloody vomiting, bloody diarrhea, you are unable to tolerate fluids due to vomiting, fever greater than 101, or any other concerning symptoms.

## 2022-02-23 ENCOUNTER — Telehealth: Payer: Self-pay | Admitting: *Deleted

## 2022-02-23 NOTE — Telephone Encounter (Signed)
        Patient  visited [facility] ed on 02/14/2022  for treatment   Telephone encounter attempt :  1st  Someone answered but hung up 2x   [facility] [phone] [address] , [place] [address] Email : [email or web address]

## 2022-02-25 ENCOUNTER — Telehealth: Payer: Self-pay | Admitting: *Deleted

## 2022-02-25 NOTE — Telephone Encounter (Signed)
        Patient  visited [facility] ed on 02/14/2022  for Treatment    Telephone encounter attempt :  2nd  A HIPAA compliant voice message was left requesting a return call.  Instructed patient to call back at [phone]. [facility] [phone] [address] , [place] [address] Email : [email or web address]

## 2022-04-08 ENCOUNTER — Telehealth: Payer: Self-pay | Admitting: General Practice

## 2022-04-08 NOTE — Telephone Encounter (Signed)
Patient called in and was wanting to know if she could re-establish care with [facility]. Please advise. Thank you!

## 2022-04-11 NOTE — Telephone Encounter (Signed)
Yes, that's fine. Please set her up for a 40 min re-establish visit. Thanks!

## 2022-04-12 NOTE — Telephone Encounter (Signed)
LVM for patient to call back and schedule re-est appt.

## 2022-04-18 DIAGNOSIS — F900 Attention-deficit hyperactivity disorder, predominantly inattentive type: Secondary | ICD-10-CM | POA: Diagnosis not present

## 2022-04-18 DIAGNOSIS — F4312 Post-traumatic stress disorder, chronic: Secondary | ICD-10-CM | POA: Diagnosis not present

## 2022-04-18 DIAGNOSIS — F3342 Major depressive disorder, recurrent, in full remission: Secondary | ICD-10-CM | POA: Diagnosis not present

## 2022-04-18 DIAGNOSIS — F411 Generalized anxiety disorder: Secondary | ICD-10-CM | POA: Diagnosis not present

## 2022-04-18 DIAGNOSIS — G43119 Migraine with aura, intractable, without status migrainosus: Secondary | ICD-10-CM | POA: Diagnosis not present

## 2022-09-06 IMAGING — CR DG LUMBAR SPINE 2-3V
3 series · 3 of 3 positions shown · non-contrast
Comparison: Intraoperative radiographs 11/21/2018.

CLINICAL DATA: Chronic pain syndrome. Low back and bilateral hip
pain.

EXAM:
LUMBAR SPINE - 2-3 VIEW

[t l-spine a.p.]
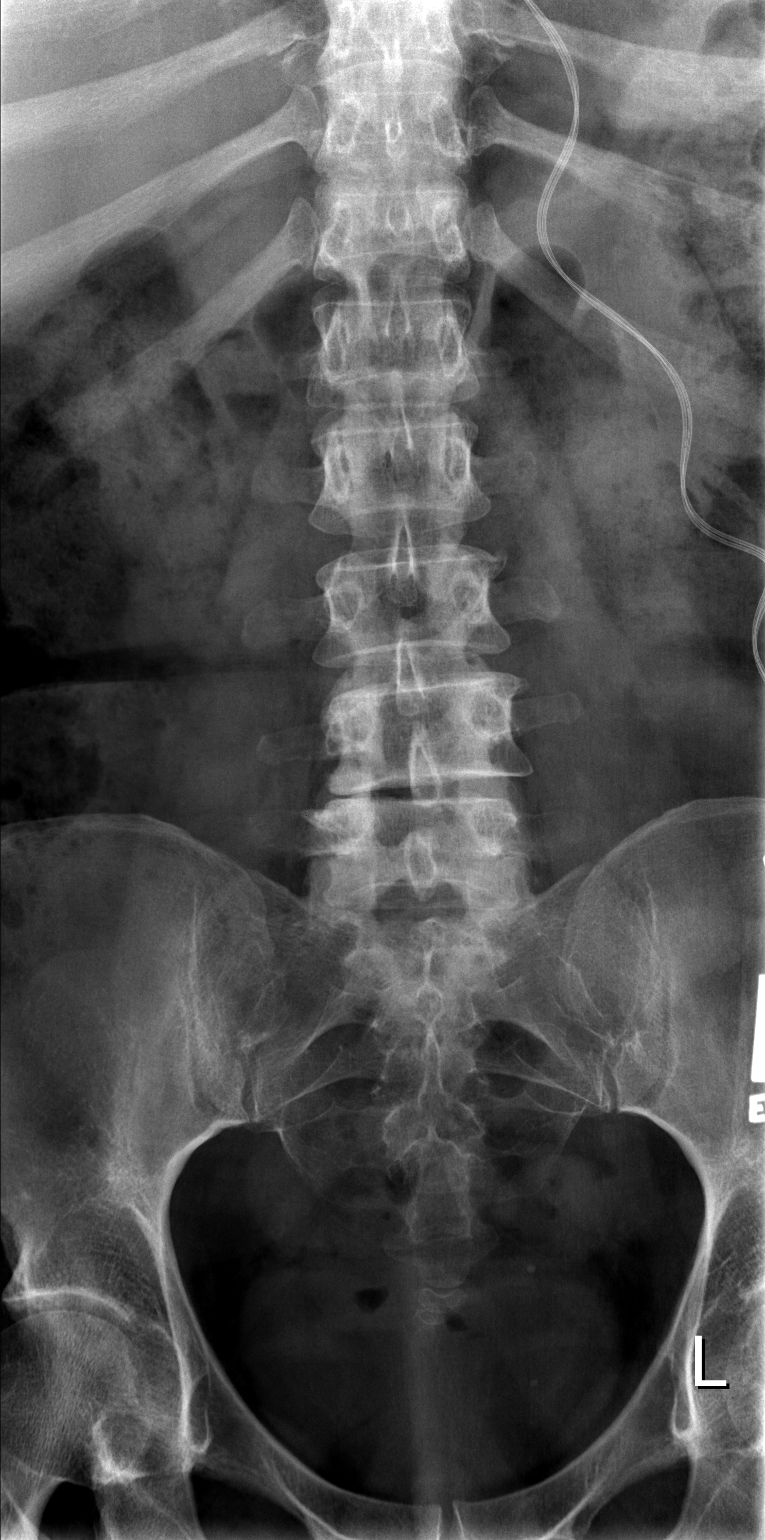

[t l-spine lat]
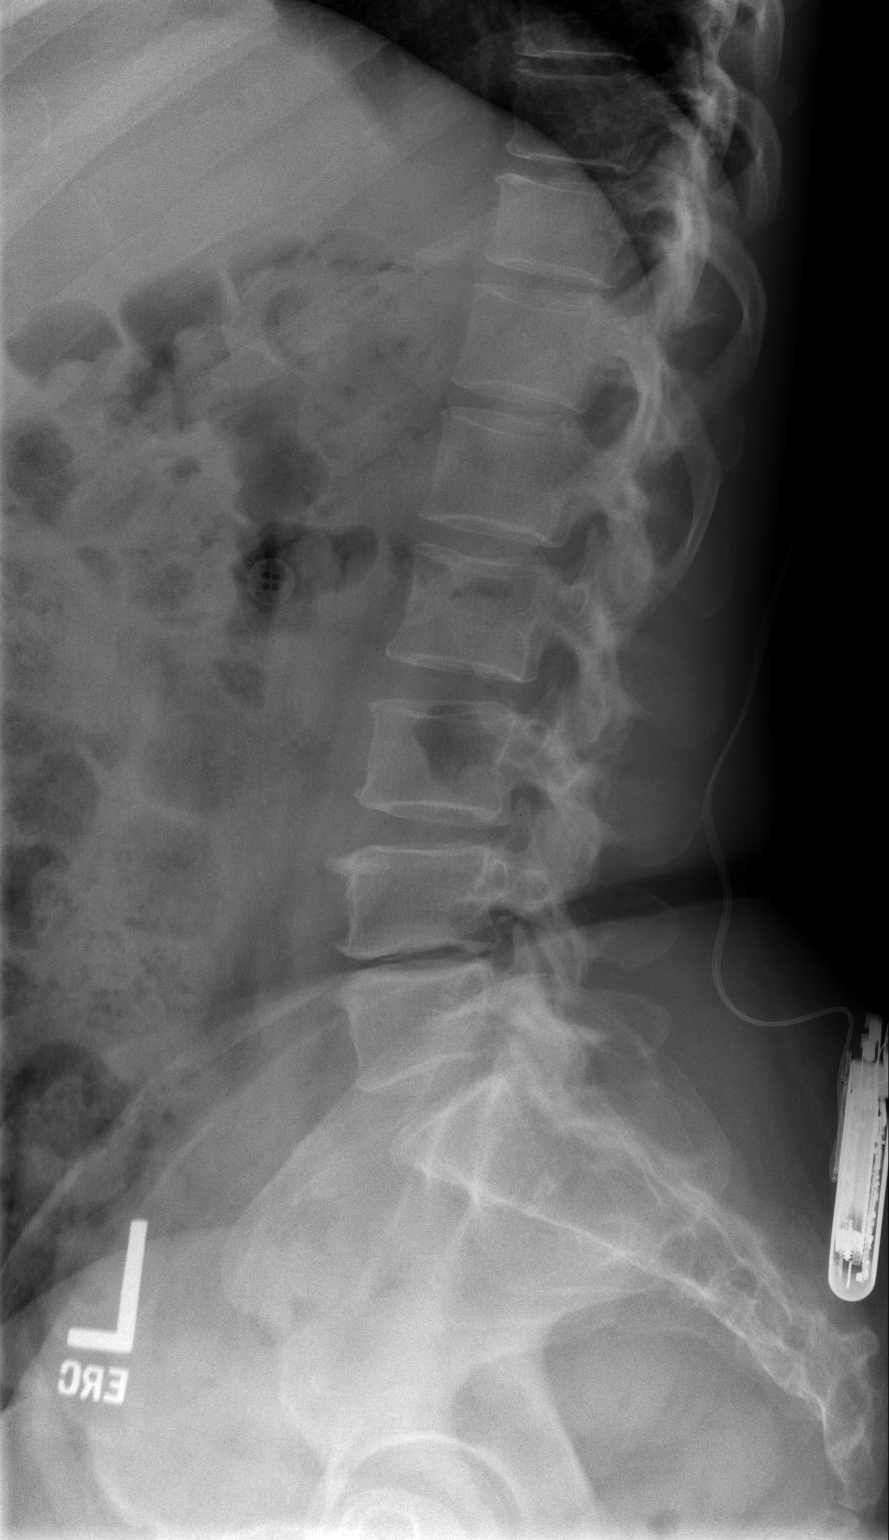

[t l-spine l5-s1 spot]
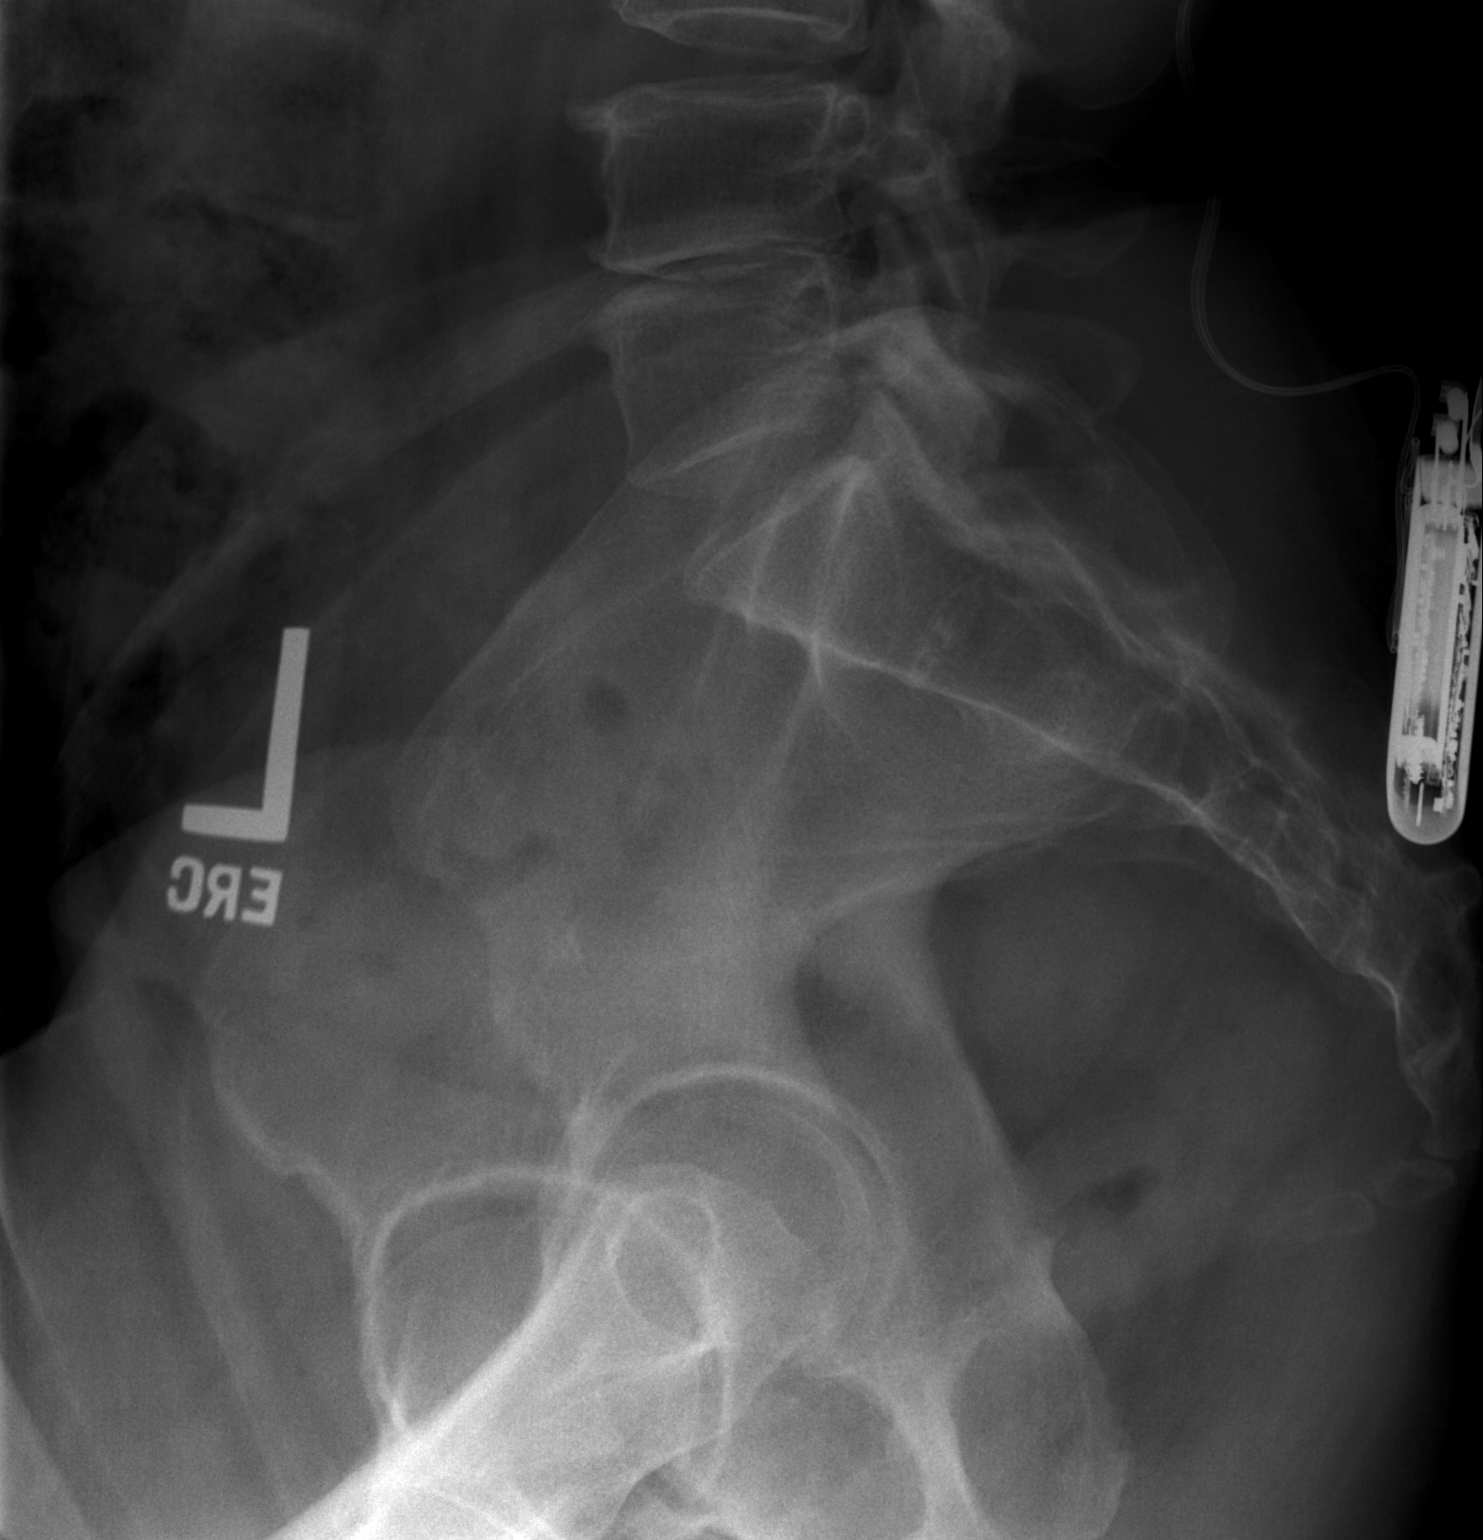

[3 of 3 positions shown; findings below may reference images not displayed]

FINDINGS: There are 5 lumbar type vertebral bodies. The alignment is normal.
There is progressive disc space narrowing with endplate osteophytes
and vacuum phenomenon at L4-5 post surgery on the right. The
additional disc spaces are preserved. Thoracic spinal stimulator
noted.
IMPRESSION: Progressive spondylosis at L4-5. No acute osseous findings or
malalignment.

## 2022-09-22 DIAGNOSIS — R0602 Shortness of breath: Secondary | ICD-10-CM | POA: Diagnosis not present

## 2022-09-22 DIAGNOSIS — E039 Hypothyroidism, unspecified: Secondary | ICD-10-CM | POA: Diagnosis not present

## 2022-09-22 DIAGNOSIS — E1165 Type 2 diabetes mellitus with hyperglycemia: Secondary | ICD-10-CM | POA: Diagnosis not present

## 2022-09-29 DIAGNOSIS — Z6834 Body mass index (BMI) 34.0-34.9, adult: Secondary | ICD-10-CM | POA: Diagnosis not present

## 2022-09-29 DIAGNOSIS — J45909 Unspecified asthma, uncomplicated: Secondary | ICD-10-CM | POA: Diagnosis not present

## 2022-09-29 DIAGNOSIS — E669 Obesity, unspecified: Secondary | ICD-10-CM | POA: Diagnosis not present

## 2022-09-29 DIAGNOSIS — F341 Dysthymic disorder: Secondary | ICD-10-CM | POA: Diagnosis not present

## 2022-09-29 DIAGNOSIS — F909 Attention-deficit hyperactivity disorder, unspecified type: Secondary | ICD-10-CM | POA: Diagnosis not present

## 2022-09-29 DIAGNOSIS — E248 Other Cushing's syndrome: Secondary | ICD-10-CM | POA: Diagnosis not present

## 2022-09-29 DIAGNOSIS — G9050 Complex regional pain syndrome I, unspecified: Secondary | ICD-10-CM | POA: Diagnosis not present

## 2022-09-29 DIAGNOSIS — E039 Hypothyroidism, unspecified: Secondary | ICD-10-CM | POA: Diagnosis not present

## 2022-09-29 DIAGNOSIS — E1165 Type 2 diabetes mellitus with hyperglycemia: Secondary | ICD-10-CM | POA: Diagnosis not present

## 2022-10-07 ENCOUNTER — Encounter: Payer: Self-pay | Admitting: Pharmacist

## 2022-10-07 DIAGNOSIS — Z9189 Other specified personal risk factors, not elsewhere classified: Secondary | ICD-10-CM

## 2022-10-07 NOTE — Progress Notes (Signed)
[facility]) [facility] Quality Pharmacy Team Statin Quality Measure Assessment  10/07/2022  Tanya [name] Jul 02, 1970 [id number]  Per review of chart and payor information, patient is currently taking a medication used to treat but is not currently filling a statin prescription.  This places patient into the Statin Use In Patients with Diabetes (SUPD) measure for CMS.    Patient ha an upcoming follow up appointment on 10/10/2022..  If deemed therapeutically appropriate, statin therapy could be assessed at the upcoming visit.  If patient has experienced statin intolerance, a statin exclusion code could be associated with the upcoming visit. Prediabetes is also a statin exclusion.  It is unclear if the patient has prediabetes because there is not a recent A1c on her file.   The 10-year ASCVD risk score (Arnett DK, et al., 2019) is: 4.7%   Values used to calculate the score:     Age: 52 years     Sex: Female     Is Non-Hispanic African American: No     Diabetic: Yes     Tobacco smoker: Yes     Systolic Blood Pressure: 118 mmHg     Is BP treated: Yes     HDL Cholesterol: 68.2 mg/dL     Total Cholesterol: 158 mg/dL 88/06/1658     Component Value Date/Time   CHOL 158 01/21/2020 1214   TRIG 60.0 01/21/2020 1214   HDL 68.20 01/21/2020 1214   CHOLHDL 2 01/21/2020 1214   VLDL 12.0 01/21/2020 1214   LDLCALC 78 01/21/2020 1214    Please consider ONE of the following recommendations:  Initiate high intensity statin Atorvastatin 40 mg once daily, #90, 3 refills   Rosuvastatin 20 mg once daily, #90, 3 refills    Initiate moderate intensity          statin with reduced frequency if prior          statin intolerance 1x weekly, #13, 3 refills   2x weekly, #26, 3 refills   3x weekly, #39, 3 refills    Code for past statin intolerance or  other exclusions (required annually)  Provider Requirements: Associate code during an office visit or telehealth encounter  Drug Induced  Myopathy G72.0   Myopathy, unspecified G72.9   Myositis, unspecified M60.9   Rhabdomyolysis M62.82   Cirrhosis of liver K74.69   Prediabetes R73.03   PCOS E28.2   Plan:  Route note to Provider prior to the upcoming appointment.  Beecher Mcardle, PharmD, BCACP [facility] Clinical Pharmacist [phone]

## 2022-10-10 ENCOUNTER — Encounter: Payer: Self-pay | Admitting: Primary Care

## 2022-10-10 ENCOUNTER — Ambulatory Visit (INDEPENDENT_AMBULATORY_CARE_PROVIDER_SITE_OTHER): Payer: PPO | Admitting: Primary Care

## 2022-10-10 VITALS — BP 120/78 | HR 118 | Temp 97.3°F | Ht 60.0 in | Wt 181.0 lb

## 2022-10-10 DIAGNOSIS — L739 Follicular disorder, unspecified: Secondary | ICD-10-CM

## 2022-10-10 DIAGNOSIS — G90511 Complex regional pain syndrome I of right upper limb: Secondary | ICD-10-CM | POA: Diagnosis not present

## 2022-10-10 DIAGNOSIS — G43E09 Chronic migraine with aura, not intractable, without status migrainosus: Secondary | ICD-10-CM | POA: Insufficient documentation

## 2022-10-10 DIAGNOSIS — J4520 Mild intermittent asthma, uncomplicated: Secondary | ICD-10-CM | POA: Diagnosis not present

## 2022-10-10 DIAGNOSIS — L709 Acne, unspecified: Secondary | ICD-10-CM

## 2022-10-10 DIAGNOSIS — F32A Depression, unspecified: Secondary | ICD-10-CM

## 2022-10-10 DIAGNOSIS — E6609 Other obesity due to excess calories: Secondary | ICD-10-CM | POA: Insufficient documentation

## 2022-10-10 DIAGNOSIS — R7303 Prediabetes: Secondary | ICD-10-CM | POA: Diagnosis not present

## 2022-10-10 DIAGNOSIS — F988 Other specified behavioral and emotional disorders with onset usually occurring in childhood and adolescence: Secondary | ICD-10-CM

## 2022-10-10 DIAGNOSIS — I10 Essential (primary) hypertension: Secondary | ICD-10-CM

## 2022-10-10 DIAGNOSIS — F4310 Post-traumatic stress disorder, unspecified: Secondary | ICD-10-CM

## 2022-10-10 DIAGNOSIS — E039 Hypothyroidism, unspecified: Secondary | ICD-10-CM

## 2022-10-10 DIAGNOSIS — Z6835 Body mass index (BMI) 35.0-35.9, adult: Secondary | ICD-10-CM

## 2022-10-10 DIAGNOSIS — G894 Chronic pain syndrome: Secondary | ICD-10-CM

## 2022-10-10 MED ORDER — METFORMIN HCL ER 500 MG PO TB24: 500.0000 mg | ORAL_TABLET | Freq: Every day | ORAL | 1 refills | Status: DC

## 2022-10-10 MED ORDER — TRETINOIN 0.05 % EX CREA: TOPICAL_CREAM | Freq: Every day | CUTANEOUS | 0 refills | Status: DC

## 2022-10-10 MED ORDER — MUPIROCIN 2 % EX OINT: 1.0000 | TOPICAL_OINTMENT | Freq: Every day | CUTANEOUS | 0 refills | Status: AC

## 2022-10-10 MED ORDER — SUMATRIPTAN SUCCINATE 50 MG PO TABS: ORAL_TABLET | ORAL | 0 refills | Status: DC

## 2022-10-10 MED ORDER — ALBUTEROL SULFATE HFA 108 (90 BASE) MCG/ACT IN AERS
2.0000 | INHALATION_SPRAY | Freq: Four times a day (QID) | RESPIRATORY_TRACT | 0 refills | Status: AC | PRN
Start: 2022-10-10 — End: ?

## 2022-10-10 NOTE — Addendum Note (Signed)
Addended by: Doreene Nest on: 10/10/2022 04:23 PM   Modules accepted: Orders

## 2022-10-10 NOTE — Assessment & Plan Note (Signed)
Continue Bactroban 2% cream as needed. Refill provided.

## 2022-10-10 NOTE — Assessment & Plan Note (Addendum)
Following with psychiatry. Stable.  Continue Adderall XR 20 mg daily, Adderall 20 mg daily.

## 2022-10-10 NOTE — Assessment & Plan Note (Signed)
Controlled.  Remain off treatment. Continue to monitor. 

## 2022-10-10 NOTE — Patient Instructions (Addendum)
Start sumatriptan (Imitrex) 50 mg tablets for migraine abortion.  Take 1 tablet by mouth at migraine onset, may take second tablet 2 hours later if migraine persist.  Do not exceed 2 tablets in 24 hours.  Please notify me if you use the albuterol inhaler more than 3 times weekly on average.  We changed your metformin to the ER version.  Take metformin 500 mg ER once daily with a meal.  Please schedule a follow up visit for 3 months.  It was a pleasure to see you today!

## 2022-10-10 NOTE — Assessment & Plan Note (Signed)
She is taking levothyroxine correctly.   Continue levothyroxine 75 mcg daily. Repeat TSH in 3 months.

## 2022-10-10 NOTE — Assessment & Plan Note (Signed)
Following with orthopedic pain management.  Continue naltrexone 12 mg daily, methocarbamol 750 mg TID, tiagabine 16 mg daily.

## 2022-10-10 NOTE — Assessment & Plan Note (Signed)
Controlled. Following with psychiatry.  Continue bupropion XL 450 mg daily, venlafaxine ER 150 mg daily, doxepin 150 mg at bedtime, estazolam 2 mg at bedtime, alprazolam 1 mg 3 times daily as needed

## 2022-10-10 NOTE — Progress Notes (Addendum)
Subjective:    Patient ID: Tanya [name], female    DOB: May 21, 1970, 52 y.o.   MRN: [id number]  HPI  Tanya [name] is a very pleasant 52 y.o. female with a history of hypertension, asthma, hypothyroidism, chronic pain syndrome, depression, PTSD who presents today to reestablish care.  1) ADD/PTSD/Depression: Currently following with psychiatry and is managed on Adderall XR 20 mg daily and Adderall 20 mg daily, bupropion XL 450 mg daily, doxepin 150 mg at bedtime, estazolam 2 mg at bedtime, venlafaxine XR 150 mg daily, alprazolam 1 mg 3 times daily as needed.   Overall feels well managed on her current regimen.   2) Chronic Pain Syndrome: Following with orthopedics pain management.  Also, previously following with [facility], has not been seen since 2023.  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  S/P lumbar spine fusion in 2020. Follows with pain management and is managed on Naltrexone 12 mg daily, methocarbamol 750 mg 3 times daily as needed, tiagabine 16 mg daily.   She plans on getting back in for an appointment with [facility] soon.   3) Prediabetes/Class 2 Obesity: She presents today with labs from [facility] drawn 09/22/2022 which reveal A1c of 5.9.  Her last A1c was 5.2 on 11/01/2021. Currently managed on metformin 500 mg twice daily, Ozempic 1 mg weekly per endocrinology. Her last dose of Ozempic was about 4 months ago. Since then she's gained 30 pounds.  She would like to resume Ozempic.  Her endocrinologist is retiring.  Prior to his retirement he was working to set her up on patient assistance for [employer] as her insurance did not cover.  Paperwork was never completed.  Wt Readings from Last 3 Encounters:  10/10/22 181 lb (82.1 kg)  12/04/21 155 lb (70.3 kg)  02/11/20 153 lb (69.4 kg)   Body mass index is 35.35 kg/m.   4) Hypothyroidism: She presents today with labs from [facility] drawn  09/22/2022 which revealed a TSH of 5.340.  TSH from 11/01/2021 was 2.8.  She is managed on levothyroxine 75 mcg tablets for which she resumed recently as she had run out. She takes her levothyroxine every morning on an empty stomach with water only. No food or other medications for 30 minutes. No heartburn medication, iron pills, calcium, vitamin D, or magnesium pills within four hours of taking levothyroxine.    5) Asthma: Currently managed on albuterol inhaler as needed. Since getting her dog in 2023 she's noticed increased symptoms of wheezing, shortness of breath, itchy eyes. She uses her albuterol inhaler several times monthly.   She is compliant to her Zyrtec 10 mg HS. She has not tried Singulair.   6) Migraines: Chronic for years and located to the frontal lobes and occipital lobes. Migraines occur 1-2 times monthly with an aura of "floaters/dots" in her visual fields. She will experience photophobia, nausea. Previously managed on Topamax 100 mg BID for complex regional pain syndrome which did not help with migraines. She has not been on Topamax in about 6 months. General headaches occur daily.  She has never tried abortive medication for migraines.  7) Chronic Rash/Rosacea: Previously following with dermatology and managed on Bactroban for reactions to insect bites. Also managed on Retin-A 0.05% cream for rosacea and is needing refills.  Her dermatologist has left the practice.  BP Readings from Last 3 Encounters:  10/10/22 120/78  02/14/22 118/72  12/04/21 (!) 100/59  Review of Systems  Constitutional:  Negative for unexpected weight change.  Eyes:  Positive for itching.  Respiratory:  Negative for shortness of breath.   Cardiovascular:  Negative for chest pain.  Gastrointestinal:  Positive for constipation.  Musculoskeletal:  Positive for arthralgias.  Skin:  Positive for rash.  Allergic/Immunologic: Positive for environmental allergies.  Neurological:  Positive for headaches.   Psychiatric/Behavioral:  Negative for sleep disturbance. The patient is not nervous/anxious.          Past Medical History:  Diagnosis Date   ADD (attention deficit disorder)    Anemia    Anxiety    Arthritis    Asthma    Blood in stool    Chickenpox    Complex regional pain syndrome I    Complex regional pain syndrome of right upper extremity    Depression    Dysuria 06/23/2019   Frequent headaches    migraines   Heart murmur    Hypertension    Hypothyroidism    IBS (irritable bowel syndrome)    Insulin resistance    Lumbar radiculopathy    Mononucleosis    in high school   Ovarian cyst, left 02/03/2020   Ovarian cyst, right 02/03/2020   Pelvic pain 01/21/2020   Pneumonia    in high school   PONV (postoperative nausea and vomiting)    Preoperative clearance 11/09/2018   Seasonal allergies    Shingles    Spinal stenosis 11/21/2018   Spinal stenosis, lumbar    UTI (urinary tract infection)    Wears glasses     Social History   Socioeconomic History   Marital status: Married    Spouse name: Not on file   Number of children: Not on file   Years of education: Not on file   Highest education level: Not on file  Occupational History   Not on file  Tobacco Use   Smoking status: Former    Types: Cigarettes   Smokeless tobacco: Never   Tobacco comments:    quit smoking cirarettes at age 45  Vaping Use   Vaping status: Never Used  Substance and Sexual Activity   Alcohol use: Yes    Comment: occasional   Drug use: Never   Sexual activity: Yes    Birth control/protection: Surgical  Other Topics Concern   Not on file  Social History Narrative   Married.   Disabled.   Once worked as an Charity fundraiser.   Social Determinants of Health   Financial Resource Strain: Not on file  Food Insecurity: Not on file  Transportation Needs: Not on file  Physical Activity: Not on file  Stress: Not on file  Social Connections: Unknown (07/21/2021)   [facility]   Social Network    Social Network: Not on file  Intimate Partner Violence: Unknown (06/16/2021)   Received from [facility], [facility]   HITS    Physically Hurt: Not on file    Insult or Talk Down To: Not on file    Threaten Physical Harm: Not on file    Scream or Curse: Not on file    Past Surgical History:  Procedure Laterality Date   ABDOMINAL HYSTERECTOMY  2009   ANTERIOR AND POSTERIOR SPINAL FUSION  11/21/2018   AUGMENTATION MAMMAPLASTY Bilateral    lift done at time of augmentation   BACK SURGERY     BREAST ENHANCEMENT SURGERY  2008   CARPAL TUNNEL RELEASE Right 2009   CESAREAN SECTION  1993, 1997, 2006   cold cone colonization     LAPAROSCOPIC UNILATERAL SALPINGO OOPHERECTOMY Right 02/13/2020   Procedure: LAPAROSCOPIC RIGHT OOPHORECTOMY;  Surgeon: Nadara Mustard, MD;  Location: [facility] ORS;  Service: Gynecology;  Laterality: Right;   LEEP  2000   LUMBAR LAMINECTOMY/DECOMPRESSION MICRODISCECTOMY Right 11/21/2018   Procedure: RIGHT LUMBAR THREE THROUGH FOUR, LUMBAR FOUR THROUGH FIVE DECOMPRESSION;  Surgeon: Venita Lick, MD;  Location: [facility] OR;  Service: Orthopedics;  Laterality: Right;  150 mins   SPINAL CORD STIMULATOR INSERTION  2017   TUBAL LIGATION     UTERINE FIBROID SURGERY  2005    Family History  Problem Relation Age of Onset   Depression Mother    Learning disabilities Mother    Depression Father    Early death Father    Depression Sister    Drug abuse Sister    Early death Sister    Mental illness Sister    Miscarriages / [place] Sister    Multiple sclerosis Sister    Alcohol abuse Daughter    Depression Daughter    Diabetes Daughter    Learning disabilities Daughter    Depression Son    Learning disabilities Son    Cancer Maternal Grandmother    Hearing loss Maternal Grandmother    Breast cancer Maternal Grandmother    Alcohol abuse Paternal Grandmother    Heart disease Paternal Grandmother    Hyperlipidemia Paternal  Grandmother    Hypertension Paternal Grandmother    Kidney disease Paternal Grandmother    ADD / ADHD Paternal Grandfather    COPD Paternal Grandfather    Kidney disease Paternal Grandfather    Hypertension Paternal Grandfather    Hyperlipidemia Paternal Grandfather    Depression Son     Allergies  Allergen Reactions   Hydrocodone-Acetaminophen Hives, Itching and Other (See Comments)        Oxycodone-Acetaminophen Hives, Itching and Other (See Comments)        Codeine Other (See Comments)   Pertussis Vaccines Other (See Comments)    unknown    Current Outpatient Medications on File Prior to Visit  Medication Sig Dispense Refill   ALPRAZolam (XANAX XR) 1 MG 24 hr tablet Take 1 mg by mouth in the morning, at noon, and at bedtime.     amphetamine-dextroamphetamine (ADDERALL XR) 20 MG 24 hr capsule Take 20 mg by mouth daily.      amphetamine-dextroamphetamine (ADDERALL) 20 MG tablet Take 20 mg by mouth See admin instructions. Take 1 tablet every mroning and take 1 tablet at 1300.     b complex vitamins capsule Take 1 capsule by mouth daily.     buPROPion (WELLBUTRIN XL) 150 MG 24 hr tablet Take 150 mg by mouth See admin instructions. Take 1 tablet in the morning and take 1 tablet at 1300 NAME BRAND ONLY     buPROPion (WELLBUTRIN XL) 300 MG 24 hr tablet Take 300 mg by mouth daily. NAME BRAND ONLY     cetirizine (ZYRTEC) 10 MG tablet Take 10 mg by mouth at bedtime.     clobetasol ointment (TEMOVATE) 0.05 % Apply to affected areas twice daily (Patient taking differently: Apply 1 application  topically 2 (two) times daily. Apply to affected areas twice daily) 45 g 1   doxepin (SINEQUAN) 50 MG capsule Take 150 mg by mouth at bedtime.      estazolam (PROSOM) 2 MG tablet Take 2 mg by mouth at bedtime.     levothyroxine (SYNTHROID) 75 MCG tablet TAKE 1  TABLET EVERY DAY ON EMPTY STOMACHWITH A GLASS OF WATER AT LEAST 30-60 MINBEFORE BREAKFAST 90 tablet 0   melatonin 5 MG TABS Take 5 mg by  mouth at bedtime as needed (sleep).     Multiple Vitamins-Minerals (MULTIVITAMIN WITH MINERALS) tablet Take 1 tablet by mouth daily.     NALTREXONE HCL PO Take 6 mg by mouth daily.      orlistat (ALLI) 60 MG capsule Take 120 mg by mouth 3 (three) times daily as needed (constipation). Do not exceed 3 capsules daily.     polyethylene glycol (MIRALAX / GLYCOLAX) 17 g packet Take 17 g by mouth daily as needed for moderate constipation.     tiaGABine (GABITRIL) 4 MG tablet Take 16 mg by mouth at bedtime.     topiramate (TOPAMAX) 100 MG tablet Take 1 tablet by mouth 2 (two) times daily.     venlafaxine XR (EFFEXOR-XR) 150 MG 24 hr capsule Take 300 mg by mouth daily with breakfast.      methocarbamol (ROBAXIN) 750 MG tablet Take 750 mg by mouth 3 (three) times daily as needed.     No current facility-administered medications on file prior to visit.    BP 120/78   Pulse (!) 118   Temp (!) 97.3 F (36.3 C) (Temporal)   Ht 5' (1.524 m)   Wt 181 lb (82.1 kg)   LMP  (LMP Unknown)   SpO2 98%   BMI 35.35 kg/m  Objective:   Physical Exam HENT:     Head: Normocephalic.  Eyes:     Conjunctiva/sclera: Conjunctivae normal.  Cardiovascular:     Rate and Rhythm: Normal rate and regular rhythm.  Pulmonary:     Effort: Pulmonary effort is normal.     Breath sounds: Normal breath sounds.  Abdominal:     General: Bowel sounds are normal.     Palpations: Abdomen is soft.  Musculoskeletal:     Cervical back: Neck supple.  Skin:    General: Skin is warm and dry.  Neurological:     Mental Status: She is alert and oriented to person, place, and time.  Psychiatric:        Mood and Affect: Mood normal.           Assessment & Plan:  Essential hypertension Assessment & Plan: Controlled.  Remain off treatment.  Continue to monitor.     Mild intermittent asthma without complication Assessment & Plan: Controlled.   Continue albuterol inhaler PRN. Refills provided today.   Orders: -      Albuterol Sulfate HFA; Inhale 2 puffs into the lungs every 6 (six) hours as needed for wheezing or shortness of breath.  Dispense: 16 g; Refill: 0  Hypothyroidism, unspecified type Assessment & Plan: She is taking levothyroxine correctly.   Continue levothyroxine 75 mcg daily. Repeat TSH in 3 months.    Complex regional pain syndrome type 1 of right upper extremity Assessment & Plan: Following with orthopedic pain management.   Continue naltrexone 12 mg daily, methocarbamol 750 mg TID, tiagabine 16 mg daily.    Chronic pain syndrome Assessment & Plan: Following with orthopedic pain management.  Continue naltrexone 12 mg daily, methocarbamol 750 mg TID, tiagabine 16 mg daily.   Prediabetes Assessment & Plan: Historically following with endocrinology.  Change metformin to ER 500 mg daily.  Will work on patient assistance for obesity/weight loss.  Will obtain records from endocrinology .  Follow up in 6 months.   Orders: -  metFORMIN HCl ER; Take 1 tablet (500 mg total) by mouth daily with breakfast. For prediabetes  Dispense: 90 tablet; Refill: 1 -     AMB Referral to Pharmacy Medication Management  Folliculitis Assessment & Plan: Continue Bactroban 2% cream as needed. Refill provided.  Orders: -     Mupirocin; Apply 1 Application topically daily. To open areas and cover with band aid.  Dispense: 22 g; Refill: 0  Class 2 obesity due to excess calories without serious comorbidity with body mass index (BMI) of 35.0 to 35.9 in adult Assessment & Plan: Successful on Ozempic with regain of weight since discontinuation.   Will work with pharmacy to see if she qualifies with [employer] for patient assistance. Start with Ozempic 0.25 mg weekly x 4 weeks, then increase to 0.5 mg weekly x 4 weeks, then increase to 1 mg weekly thereafter.  Will also obtain office notes from endocrinologist.  Orders: -     AMB Referral to Pharmacy Medication Management  Acne,  unspecified acne type -     Tretinoin; Apply topically at bedtime.  Dispense: 45 g; Refill: 0  Chronic migraine with aura without status migrainosus, not intractable Assessment & Plan: Uncontrolled.  Offered a different type of daily preventative treatment as Topamax is not effective, she kindly declines. She does agree to abortive treatment.  Prescription for sumatriptan 50 mg sent to pharmacy for her to use as needed.  Discussed instructions for use.  Orders: -     SUMAtriptan Succinate; Take 1 tablet by mouth at migraine onset. May repeat in 2 hours if headache persists or recurs.  Dispense: 10 tablet; Refill: 0  Attention deficit disorder, unspecified hyperactivity presence Assessment & Plan: Following with psychiatry. Stable.  Continue Adderall XR 20 mg daily, Adderall 20 mg daily.   Depression, unspecified depression type Assessment & Plan: Controlled. Following with psychiatry.  Continue bupropion XL 450 mg daily, venlafaxine ER 150 mg daily, doxepin 150 mg at bedtime, estazolam 2 mg at bedtime.   Post-traumatic stress disorder Assessment & Plan: Controlled. Following with psychiatry.  Continue bupropion XL 450 mg daily, venlafaxine ER 150 mg daily, doxepin 150 mg at bedtime, estazolam 2 mg at bedtime, alprazolam 1 mg 3 times daily as needed         Doreene Nest, NP

## 2022-10-10 NOTE — Assessment & Plan Note (Addendum)
Controlled.   Continue albuterol inhaler PRN. Refills provided today.

## 2022-10-10 NOTE — Assessment & Plan Note (Addendum)
Historically following with endocrinology.  Change metformin to ER 500 mg daily.  Will work on patient assistance for obesity/weight loss.  Will obtain records from endocrinology .  Follow up in 6 months.

## 2022-10-10 NOTE — Assessment & Plan Note (Signed)
Uncontrolled.  Offered a different type of daily preventative treatment as Topamax is not effective, she kindly declines. She does agree to abortive treatment.  Prescription for sumatriptan 50 mg sent to pharmacy for her to use as needed.  Discussed instructions for use.

## 2022-10-10 NOTE — Assessment & Plan Note (Signed)
Controlled. Following with psychiatry.  Continue bupropion XL 450 mg daily, venlafaxine ER 150 mg daily, doxepin 150 mg at bedtime, estazolam 2 mg at bedtime.

## 2022-10-10 NOTE — Assessment & Plan Note (Signed)
Successful on Ozempic with regain of weight since discontinuation.   Will work with pharmacy to see if she qualifies with [employer] for patient assistance. Start with Ozempic 0.25 mg weekly x 4 weeks, then increase to 0.5 mg weekly x 4 weeks, then increase to 1 mg weekly thereafter.  Will also obtain office notes from endocrinologist.

## 2022-10-20 ENCOUNTER — Telehealth: Payer: Self-pay

## 2022-10-20 NOTE — Progress Notes (Signed)
   Care Guide Note  10/20/2022 Name: BRANDON MACKIEWICZ MRN: [id number] DOB: 1970/10/11  Referred by: Doreene Nest, NP Reason for referral : Care Management (Outreach to schedule with pharm d )   TANGEE PEDDLE is a 52 y.o. year old female who is a primary care patient of Doreene Nest, NP. Kalman Jewels was referred to the pharmacist for assistance related to DM.    An unsuccessful telephone outreach was attempted today to contact the patient who was referred to the pharmacy team for assistance with medication assistance. Additional attempts will be made to contact the patient.   Penne Lash, RMA Care Guide [facility]  [place], [place] [address] Direct Dial: [phone] [email or web address]

## 2022-10-25 NOTE — Progress Notes (Signed)
   Care Guide Note  10/25/2022 Name: ELLOWYN JEFFORDS MRN: [id number] DOB: [birth year]  Referred by: Doreene Nest, NP Reason for referral : Care Management (Outreach to schedule with pharm d )   FELIPE ELMENDORF is a 52 y.o. year old female who is a primary care patient of Doreene Nest, NP. Kalman Jewels was referred to the pharmacist for assistance related to DM.    Successful contact was made with the patient to discuss pharmacy services including being ready for the pharmacist to call at least 5 minutes before the scheduled appointment time, to have medication bottles and any blood sugar or blood pressure readings ready for review. The patient agreed to meet with the pharmacist via with the pharmacist via telephone visit on (date/time).  12/01/2022  Penne Lash, RMA Care Guide [facility]  [place], [place] [address] Direct Dial: [phone] [email or web address]

## 2022-10-28 ENCOUNTER — Telehealth: Payer: Self-pay | Admitting: Primary Care

## 2022-10-28 NOTE — Telephone Encounter (Signed)
Pt Called stated she is still having bad migraine the RX SUMAtriptan (IMITREX) 50 MG tablet  is not working wold like something else be call in to the pharmacy Please advise #[phone]

## 2022-10-28 NOTE — Telephone Encounter (Signed)
For migraines she is to take 1 tablet of Imitrex at migraine onset, repeat with second tablet 2 hours later if migraine has not resolved.  When did her migraine start? How many doses of Imitrex as she taking?  If her migraine has continued for days and she has taken multiple doses of Imitrex then I recommend an office visit with an available provider or urgent care evaluation.

## 2022-10-31 NOTE — Telephone Encounter (Signed)
Mail box full not able to leave message. Sending my chart to call office.

## 2022-11-02 NOTE — Telephone Encounter (Signed)
Left message to return call to our office.  

## 2022-11-04 NOTE — Telephone Encounter (Signed)
Have called patient and sent my chart no return call do you want [place] to send letter?

## 2022-11-04 NOTE — Telephone Encounter (Signed)
No thanks. We will await her call back.

## 2022-12-01 ENCOUNTER — Other Ambulatory Visit: Payer: PPO

## 2022-12-01 ENCOUNTER — Other Ambulatory Visit: Payer: PPO | Admitting: Pharmacist

## 2022-12-01 ENCOUNTER — Telehealth: Payer: Self-pay | Admitting: Pharmacist

## 2022-12-01 NOTE — Progress Notes (Deleted)
12/01/2022 Name: Tanya [name] MRN: [id number] DOB: [birth year]  No chief complaint on file.   Tanya [name] is a 52 y.o. year old female who presented for a telephone visit.   They were referred to the pharmacist by their PCP for assistance in managing medication access and weight management/pre-diabetes . Recent re-established care with PCP. Pertinent   PMH: hypertension, asthma, hypothyroidism, chronic pain syndrome, depression, PTSD  Subjective:  Care Team: Primary Care Provider: Doreene Nest, NP ; Next Scheduled Visit: *** Psychology (ADD/PTSD,MDD) Orthopedics (Pain Management)  Last Weight-Related Visit: 10/10/22 with PCP Changes at last visit: Change metformin 500 mg BID to metformin XR 500 mg daily   Medication Access/Adherence  Current Pharmacy:  [facility] [address] [facility] - [place], [place] - [address] AT [facility] [address] [place] [address] Phone: [phone] Fax: [phone]   Patient reports affordability concerns with their medications: Yes ; Ozempic not covered by insurance  Patient reports access/transportation concerns to their pharmacy: No  Patient reports adherence concerns with their medications:  No    Insurance Coverage: [employer] Medicare Rx  Patient lives in a household of *** with an estimated annual income pf $***.   Medicare LIS Eligible: {YES/NO:21197}    Obesity/Overweight, Complicated by new-dx pre-diabetes: HPI:  Previously on Ozempic 1 mg weekly per endocrinology. Her last dose of Ozempic was >5 months ago. Since stopping Ozempic, she's gained 30 pounds.  She would like to discuss possible resumption of Ozempic today.   Current medications: metformin 500 mg BID  Weight Management treatments previously prescribed:   Anti-Obesity Agents Previously Tried  Phentermine No history, avoid in HD,CV Risk***  Semaglutide Ozempic 1 mg weekly (prescribed by Endocrinology, not covered by insurance  for weight management. D/c ~05/2022)  Liraglutide No history, would be candidate if able to access  Tirzepatide No history, would be candidate if able to access  Bupropion No history, caution in HD***  Naltrexone No history, caution in HD***  Topiramate No history, reduce dose 50% on HD***  Orlistat No history, not recommended by AGA  Metformin No history, caution in HD***  Other agents No history    Current meal patterns:  - Breakfast: *** - Lunch *** - Supper *** - Snacks *** - Drinks ***  Current physical activity: ***  Current medication access support: N/A   Objective:  Lab Results  Component Value Date   HGBA1C 5.4 01/21/2020    Lab Results  Component Value Date   CREATININE 0.85 02/14/2022   BUN 14 02/14/2022   NA 136 02/14/2022   K 3.9 02/14/2022   CL 106 02/14/2022   CO2 24 02/14/2022    Lab Results  Component Value Date   CHOL 158 01/21/2020   HDL 68.20 01/21/2020   LDLCALC 78 01/21/2020   TRIG 60.0 01/21/2020   CHOLHDL 2 01/21/2020    Medications Reviewed Today   Medications were not reviewed in this encounter       Assessment/Plan:   Obesity/Overweight: Uncontrolled with reported 30-lb weight re-gain after discontinuing Ozempic at the beginning of the year. Also possible that uncontrolled hypothyroidism is a contributor given she stopped taking for a period of time (TSH 5.34 09/22/22). Confirms adherence to levothyroxine 75 mcg daily since last PCP visit.  - Currently unable to achieve goal weight loss of 5-10% through diet and lifestyle modifications alone - Extensive dietary counseling including education on focus on lean proteins, fruits and vegetables, whole grains and increased  fiber consumption, adequate hydration - Extensive exercise counseling including eventual goal of 150 minutes of moderate intensity exercise weekly - Provided motivational interviewing. Discussed setting non-weight based goals - Recommend to ***   Follow Up Plan:  ***  ***

## 2022-12-01 NOTE — Telephone Encounter (Signed)
Attempted to contact patient for scheduled appointment for medication management. Left HIPAA compliant message for patient to return my call at their convenience.   Loree Fee, PharmD Clinical Pharmacist [facility] [phone]

## 2022-12-09 ENCOUNTER — Other Ambulatory Visit: Payer: Self-pay

## 2022-12-09 ENCOUNTER — Encounter: Payer: Self-pay | Admitting: Pharmacist

## 2022-12-09 DIAGNOSIS — E039 Hypothyroidism, unspecified: Secondary | ICD-10-CM

## 2022-12-09 MED ORDER — LEVOTHYROXINE SODIUM 75 MCG PO TABS: ORAL_TABLET | ORAL | 0 refills | Status: DC

## 2022-12-22 ENCOUNTER — Other Ambulatory Visit: Payer: PPO | Admitting: Pharmacist

## 2022-12-22 NOTE — Progress Notes (Signed)
12/22/2022 Name: Tanya [name] MRN: [id number] DOB: [birth year]  Chief Complaint  Patient presents with   Prediabetes   Obesity    Tanya [name] is a 52 y.o. year old female who presented for a telephone visit.   They were referred to the pharmacist by their PCP for assistance in managing medication access and weight management/pre-diabetes . Recent re-established care with PCP.   PMH: hypertension, asthma, hypothyroidism, chronic pain syndrome, depression, PTSD  Subjective:  Care Team: Primary Care Provider: Doreene Nest, NP  Psychology (ADD/PTSD,MDD) Orthopedics (Pain Management)  Last Weight-Related Visit: 10/10/22 with PCP Changes at last visit: Change metformin 500 mg BID to metformin XR 500 mg daily   Medication Access/Adherence  Current Pharmacy:  [facility] [address] [facility] - [place], [place] - [address] AT [facility] [place] [address] Phone: [phone] Fax: [phone]   Patient reports affordability concerns with their medications: Yes ; Ozempic not covered by insurance  Patient reports access/transportation concerns to their pharmacy: No  Patient reports adherence concerns with their medications:  No    Insurance Coverage: [employer] Medicare Rx  Obesity/Overweight, Complicated by new-dx pre-diabetes: HPI:  Previously on Ozempic 1 mg weekly per endocrinology. Her last dose of Ozempic was >5 months ago. Since stopping Ozempic, she's gained 30 pounds.  She states that she has resumed Ozempic 1 mg weekly about 2 months ago. On the 1.0 mg currently and tolerating well.  She reports that she is already enrolled in the NovoNordisk MAP program for free Ozempic. She states that she received a phone call for re-enrollment but has not called them back at this time, was waiting to speak to [place] about the prescription first.    Patient also reports that she has stopped taking metformin. She reports she was reading a lot  online and decided she would prefer to come off. Reports starting metformin initially for PCOS.  Current medications: Ozempic 1 mg weekly  Weight Management treatments previously prescribed:   Anti-Obesity Agents Previously Tried  Phentermine No history, avoid in increased CV Risk (Pt with hx HTN, HLD, BMI>30)  Semaglutide Ozempic 1 mg weekly (prescribed by Endocrinology, not covered by insurance for weight management. D/c ~05/2022)  Liraglutide No history, would be candidate if able to access though greater weight reduction propensity with Mounjaro/Ozempic. Not covered by insurance.   Tirzepatide No history, would be candidate if able to access. No assistance programs available.   Bupropion Taking  Naltrexone Taking  Topiramate Taking  Orlistat No history, not recommended by AGA  Metformin Previously taking. Reports no benefit regarding weight reduction.   Other agents N/A    Current meal patterns:  Previously has tried intermittent fasting which did not result in any weight loss.  Recently, purchased bottle of shake mix on [employer] protein).  Current physical activity:  Playing with her dog, walking outside, more chores around the house. Asthma limits outdoor activities.    Cardiovascular Risk Reduction History of clinical ASCVD? no The 10-year ASCVD risk score (Arnett DK, et al., 2019) is: 3.6% History of heart failure? no History of hyperlipidemia? yes Current BMI: 35.3 kg/m2 (Ht 60 in, Wt 82.1 kg) Taking statin? no; n/a Taking aspirin? not indicated; Not taking   Taking SGLT-2i? no Taking GLP- 1 RA? Not Covered by insurance, previously had great success on Ozempic  Objective: Wt Readings from Last 3 Encounters:  10/10/22 181 lb (82.1 kg)  12/04/21 155 lb (70.3 kg)  02/11/20  153 lb (69.4 kg)    Lab Results  Component Value Date   HGBA1C 5.4 01/21/2020    Lab Results  Component Value Date   CREATININE 0.85 02/14/2022   BUN 14 02/14/2022   NA 136  02/14/2022   K 3.9 02/14/2022   CL 106 02/14/2022   CO2 24 02/14/2022    Lab Results  Component Value Date   CHOL 158 01/21/2020   HDL 68.20 01/21/2020   LDLCALC 78 01/21/2020   TRIG 60.0 01/21/2020   CHOLHDL 2 01/21/2020    Medications Reviewed Today   Medications were not reviewed in this encounter     Assessment/Plan:   Weight management c/b pre-diabetes: Uncontrolled/worsening with reported 30-lb weight re-gain after discontinuing Ozempic at the beginning of the year. BMI increased from class I to class II (30.3 to 35.3 kg/m2). Received labs at [facility] also revealing worsening A1c of 5.9 (09/22/22), increased from previous 5.2% (11/01/21). Notably, weight regain despite taking metformin, bupropion, topiramate and Naltrexone. There are virtually no alternatives aside from GLP1RA to assist with weight reduction to reduce metabolic risk. Phentermine not appropriate given use of stimulant medications for ADHD. Also possible that uncontrolled hypothyroidism is in part a contributor given she stopped taking levothyroxine for a period of time (TSH 5.34 09/22/22). Her patient assistance situation remains a bit unclear (e.g. what prescriber they have on file/her ongoing eligibility w/o dx diabetes) though she claims she has received a shipment.  Diet: Extensive dietary counseling including education on focus on lean proteins, fruits and vegetables, whole grains and increased fiber consumption, adequate hydration. We discussed continuation of dietary modifications (portion sizes, increase high-protein/low sugar items to help stabilize BG) Exercise: Extensive exercise counseling including eventual goal of 150 minutes of moderate intensity exercise weekly. Reviewed exercise goals including increase in daily steps. Has been walking more and incorporating exercises (calf raises, leg raises, etc.) when doing chores around the house.  Currently unable to achieve goal weight loss of 5-10% through diet and  lifestyle modifications alone, or with other weight loss medications. Previous success on Ozempic, reasonable to continue as able per coverage. Patient plans to call NovoNord[employer] MAP back to discuss re-enrollment for 2025. She will reach out with result of that conversation and we will discuss next steps. Will try again for PA through medicare given she has exhausted all reasonable weight loss options at this time. CMM PA#: [id number]   Follow up: With pharmacist as needed. Patient to reach out with updates from [employer]. Provided patient with personal office phone #.   Future Appointments  Date Time Provider Department Center  01/10/2023 12:00 PM Doreene Nest, NP [facility]   Loree Fee, PharmD Clinical Pharmacist [facility] [phone]

## 2022-12-22 NOTE — Patient Instructions (Signed)
Ms. ZYAIRE DUMAS,   It was a pleasure to see you today! As we discussed:?   Please leave me a voicemail or send a MyChart message regarding the outcome of your discussion with [employer] program renewal. Once I hear from you, I will plan to call/message you regarding next steps.   Please reach out prior to your next scheduled appointment with Vernona Rieger should you have any questions or concerns.   Thank you!   Future Appointments  Date Time Provider Department Center  01/10/2023 12:00 PM Doreene Nest, NP [facility]   Loree Fee, PharmD Clinical Pharmacist [facility] [phone]

## 2023-01-10 ENCOUNTER — Telehealth: Payer: Self-pay | Admitting: Primary Care

## 2023-01-10 ENCOUNTER — Ambulatory Visit (INDEPENDENT_AMBULATORY_CARE_PROVIDER_SITE_OTHER): Payer: PPO | Admitting: Primary Care

## 2023-01-10 ENCOUNTER — Encounter: Payer: Self-pay | Admitting: Primary Care

## 2023-01-10 VITALS — BP 116/82 | HR 103 | Temp 98.2°F | Ht 60.0 in | Wt 184.0 lb

## 2023-01-10 DIAGNOSIS — R7303 Prediabetes: Secondary | ICD-10-CM

## 2023-01-10 DIAGNOSIS — E039 Hypothyroidism, unspecified: Secondary | ICD-10-CM

## 2023-01-10 DIAGNOSIS — Z5181 Encounter for therapeutic drug level monitoring: Secondary | ICD-10-CM | POA: Diagnosis not present

## 2023-01-10 DIAGNOSIS — E6609 Other obesity due to excess calories: Secondary | ICD-10-CM

## 2023-01-10 DIAGNOSIS — Z6835 Body mass index (BMI) 35.0-35.9, adult: Secondary | ICD-10-CM | POA: Diagnosis not present

## 2023-01-10 DIAGNOSIS — G43E09 Chronic migraine with aura, not intractable, without status migrainosus: Secondary | ICD-10-CM

## 2023-01-10 DIAGNOSIS — E66812 Obesity, class 2: Secondary | ICD-10-CM

## 2023-01-10 DIAGNOSIS — F332 Major depressive disorder, recurrent severe without psychotic features: Secondary | ICD-10-CM | POA: Diagnosis not present

## 2023-01-10 LAB — COMPREHENSIVE METABOLIC PANEL
ALT: 41 U/L — ABNORMAL HIGH (ref 0–35)
AST: 17 U/L (ref 0–37)
Albumin: 4.6 g/dL (ref 3.5–5.2)
Alkaline Phosphatase: 67 U/L (ref 39–117)
BUN: 16 mg/dL (ref 6–23)
CO2: 24 meq/L (ref 19–32)
Calcium: 9.6 mg/dL (ref 8.4–10.5)
Chloride: 107 meq/L (ref 96–112)
Creatinine, Ser: 0.99 mg/dL (ref 0.40–1.20)
GFR: 65.97 mL/min (ref >60.00)
Glucose, Bld: 110 mg/dL — ABNORMAL HIGH (ref 70–99)
Potassium: 4.3 meq/L (ref 3.5–5.1)
Sodium: 139 meq/L (ref 135–145)
Total Bilirubin: 0.4 mg/dL (ref 0.2–1.2)
Total Protein: 6.8 g/dL (ref 6.0–8.3)

## 2023-01-10 LAB — HEMOGLOBIN A1C: Hgb A1c MFr Bld: 5.8 % (ref 4.6–6.5)

## 2023-01-10 LAB — TSH: TSH: 0.97 u[IU]/mL (ref 0.35–5.50)

## 2023-01-10 MED ORDER — TOPIRAMATE 200 MG PO TABS: 200.0000 mg | ORAL_TABLET | Freq: Every day | ORAL | 2 refills | Status: DC

## 2023-01-10 MED ORDER — SUMATRIPTAN SUCCINATE 50 MG PO TABS: ORAL_TABLET | ORAL | 0 refills | Status: DC

## 2023-01-10 NOTE — Telephone Encounter (Signed)
Placed in [place] inbox for review and completion

## 2023-01-10 NOTE — Addendum Note (Signed)
Addended by: Vincenza Hews on: 01/10/2023 01:04 PM   Modules accepted: Orders

## 2023-01-10 NOTE — Progress Notes (Signed)
Subjective:    Patient ID: Tanya [name], female    DOB: Jul 27, 1970, 52 y.o.   MRN: [id number]  HPI  Tanya [name] is a very pleasant 52 y.o. female with a history of hypertension, migraines, asthma, hypothyroidism, chronic pain syndrome, PTSD, ADD, depression who presents today   1) Hypothyroidism: Currently managed on levothyroxine 75 mcg daily.  She is taking levothyroxine every every morning on an empty stomach with water only.   No food or other medications for 30 minutes.   No heartburn medication, iron pills, calcium, vitamin D, or magnesium pills within four hours of taking levothyroxine.   She is due for repeat TSH today.  2) Migraines: Chronic. During her last visit in July 2024 she endorsed continued, frequent headaches. Daily preventative headache treatment was recommended for which she declined.  She did accept a prescription for sumatriptan 50 mg to use for migraine abortion.  She has taken her Imitrex a total of three times. The first dose worked, the second time she had a migraine but had to take 2 pills, the third time she found that Imitrex did not work.   She found her old prescription of Topamax, has been taking 200 mg daily for the last month. She's found that this has helped with reduction of migraines and headaches.    3).Prediabetes/Class 2 Obesity: Previously managed on Ozempic, up to 1 mg weekly, per endocrinology. At the time, endocrinology had patient on patient assistance for Ozempic, was receiving for free. Her endocrinologist has retired.   She's currently on Ozempic 1 mg for which she resumed in late July 2024. She she's regained 30 pounds. She would like to increase her dose to 2 mg.   During her last visit her metformin was changed to XR version to minimize pill count. Today she mentions that she is no longer taking metformin due to potential side effects she read online.  She would like a referral to a nutritionist.   Wt Readings from Last 3  Encounters:  01/10/23 184 lb (83.5 kg)  10/10/22 181 lb (82.1 kg)  12/04/21 155 lb (70.3 kg)   Body mass index is 35.94 kg/m.    Review of Systems  Respiratory:  Negative for shortness of breath.   Cardiovascular:  Negative for chest pain.  Neurological:  Positive for headaches.         Past Medical History:  Diagnosis Date   ADD (attention deficit disorder)    Anemia    Anxiety    Arthritis    Asthma    Blood in stool    Chickenpox    Complex regional pain syndrome I    Complex regional pain syndrome of right upper extremity    Depression    Dysuria 06/23/2019   Frequent headaches    migraines   Heart murmur    Hypertension    Hypothyroidism    IBS (irritable bowel syndrome)    Insulin resistance    Lumbar radiculopathy    Mononucleosis    in high school   Ovarian cyst, left 02/03/2020   Ovarian cyst, right 02/03/2020   Pelvic pain 01/21/2020   Pneumonia    in high school   PONV (postoperative nausea and vomiting)    Preoperative clearance 11/09/2018   Seasonal allergies    Shingles    Spinal stenosis 11/21/2018   Spinal stenosis, lumbar    UTI (urinary tract infection)    Wears glasses     Social History   Socioeconomic  History   Marital status: Married    Spouse name: Not on file   Number of children: Not on file   Years of education: Not on file   Highest education level: Not on file  Occupational History   Not on file  Tobacco Use   Smoking status: Former    Types: Cigarettes   Smokeless tobacco: Never   Tobacco comments:    quit smoking cirarettes at age 51  Vaping Use   Vaping status: Never Used  Substance and Sexual Activity   Alcohol use: Yes    Comment: occasional   Drug use: Never   Sexual activity: Yes    Birth control/protection: Surgical  Other Topics Concern   Not on file  Social History Narrative   Married.   Disabled.   Once worked as an Charity fundraiser.   Social Determinants of Health   Financial Resource Strain: Not on  file  Food Insecurity: Not on file  Transportation Needs: Not on file  Physical Activity: Not on file  Stress: Not on file  Social Connections: Unknown (07/21/2021)   [facility]   Social Network    Social Network: Not on file  Intimate Partner Violence: Unknown (06/16/2021)   Received from [facility]   HITS    Physically Hurt: Not on file    Insult or Talk Down To: Not on file    Threaten Physical Harm: Not on file    Scream or Curse: Not on file    Past Surgical History:  Procedure Laterality Date   ABDOMINAL HYSTERECTOMY  2009   ANTERIOR AND POSTERIOR SPINAL FUSION  11/21/2018   AUGMENTATION MAMMAPLASTY Bilateral    lift done at time of augmentation   BACK SURGERY     BREAST ENHANCEMENT SURGERY  2008   CARPAL TUNNEL RELEASE Right 2009   CESAREAN SECTION  1993, 1997, 2006   cold cone colonization     LAPAROSCOPIC UNILATERAL SALPINGO OOPHERECTOMY Right 02/13/2020   Procedure: LAPAROSCOPIC RIGHT OOPHORECTOMY;  Surgeon: Nadara Mustard, MD;  Location: [facility] ORS;  Service: Gynecology;  Laterality: Right;   LEEP  2000   LUMBAR LAMINECTOMY/DECOMPRESSION MICRODISCECTOMY Right 11/21/2018   Procedure: RIGHT LUMBAR THREE THROUGH FOUR, LUMBAR FOUR THROUGH FIVE DECOMPRESSION;  Surgeon: Venita Lick, MD;  Location: [facility] OR;  Service: Orthopedics;  Laterality: Right;  150 mins   SPINAL CORD STIMULATOR INSERTION  2017   TUBAL LIGATION     UTERINE FIBROID SURGERY  2005    Family History  Problem Relation Age of Onset   Depression Mother    Learning disabilities Mother    Depression Father    Early death Father    Depression Sister    Drug abuse Sister    Early death Sister    Mental illness Sister    Miscarriages / [place] Sister    Multiple sclerosis Sister    Alcohol abuse Daughter    Depression Daughter    Diabetes Daughter    Learning disabilities Daughter    Depression Son    Learning disabilities Son    Cancer Maternal  Grandmother    Hearing loss Maternal Grandmother    Breast cancer Maternal Grandmother    Alcohol abuse Paternal Grandmother    Heart disease Paternal Grandmother    Hyperlipidemia Paternal Grandmother    Hypertension Paternal Grandmother    Kidney disease Paternal Grandmother    ADD / ADHD Paternal Grandfather    COPD Paternal Grandfather  Kidney disease Paternal Grandfather    Hypertension Paternal Grandfather    Hyperlipidemia Paternal Grandfather    Depression Son     Allergies  Allergen Reactions   Hydrocodone-Acetaminophen Hives, Itching and Other (See Comments)        Oxycodone-Acetaminophen Hives, Itching and Other (See Comments)        Codeine Other (See Comments)   Pertussis Vaccines Other (See Comments)    unknown    Current Outpatient Medications on File Prior to Visit  Medication Sig Dispense Refill   albuterol (VENTOLIN HFA) 108 (90 Base) MCG/ACT inhaler Inhale 2 puffs into the lungs every 6 (six) hours as needed for wheezing or shortness of breath. 16 g 0   ALPRAZolam (XANAX XR) 1 MG 24 hr tablet Take 1 mg by mouth in the morning, at noon, and at bedtime.     amphetamine-dextroamphetamine (ADDERALL XR) 20 MG 24 hr capsule Take 20 mg by mouth daily.      amphetamine-dextroamphetamine (ADDERALL) 20 MG tablet Take 20 mg by mouth See admin instructions. Take 1 tablet every mroning and take 1 tablet at 1300.     b complex vitamins capsule Take 1 capsule by mouth daily.     buPROPion (WELLBUTRIN XL) 150 MG 24 hr tablet Take 150 mg by mouth See admin instructions. Take 1 tablet in the morning and take 1 tablet at 1300 NAME BRAND ONLY     buPROPion (WELLBUTRIN XL) 300 MG 24 hr tablet Take 300 mg by mouth daily. NAME BRAND ONLY     cetirizine (ZYRTEC) 10 MG tablet Take 10 mg by mouth at bedtime.     clobetasol ointment (TEMOVATE) 0.05 % Apply to affected areas twice daily (Patient taking differently: Apply 1 application  topically 2 (two) times daily. Apply to affected  areas twice daily) 45 g 1   doxepin (SINEQUAN) 50 MG capsule Take 150 mg by mouth at bedtime.      estazolam (PROSOM) 2 MG tablet Take 2 mg by mouth at bedtime.     levothyroxine (SYNTHROID) 75 MCG tablet TAKE 1 TABLET EVERY DAY ON EMPTY STOMACHWITH A GLASS OF WATER AT LEAST 30-60 MINBEFORE BREAKFAST 90 tablet 0   melatonin 5 MG TABS Take 5 mg by mouth at bedtime as needed (sleep).     Multiple Vitamins-Minerals (MULTIVITAMIN WITH MINERALS) tablet Take 1 tablet by mouth daily.     mupirocin ointment (BACTROBAN) 2 % Apply 1 Application topically daily. To open areas and cover with band aid. 22 g 0   orlistat (ALLI) 60 MG capsule Take 120 mg by mouth 3 (three) times daily as needed (constipation). Do not exceed 3 capsules daily.     polyethylene glycol (MIRALAX / GLYCOLAX) 17 g packet Take 17 g by mouth daily as needed for moderate constipation.     tiaGABine (GABITRIL) 4 MG tablet Take 16 mg by mouth at bedtime.     tretinoin (RETIN-A) 0.05 % cream Apply topically at bedtime. 45 g 0   venlafaxine XR (EFFEXOR-XR) 150 MG 24 hr capsule Take 300 mg by mouth daily with breakfast.      methocarbamol (ROBAXIN) 750 MG tablet Take 750 mg by mouth 3 (three) times daily as needed. (Patient not taking: Reported on 01/10/2023)     NALTREXONE HCL PO Take 6 mg by mouth daily.  (Patient not taking: Reported on 01/10/2023)     No current facility-administered medications on file prior to visit.    BP 116/82   Pulse (!) 103  Temp 98.2 F (36.8 C) (Oral)   Ht 5' (1.524 m)   Wt 184 lb (83.5 kg)   LMP  (LMP Unknown)   SpO2 99%   BMI 35.94 kg/m  Objective:   Physical Exam Cardiovascular:     Rate and Rhythm: Normal rate and regular rhythm.  Pulmonary:     Effort: Pulmonary effort is normal.     Breath sounds: Normal breath sounds.  Musculoskeletal:     Cervical back: Neck supple.  Skin:    General: Skin is warm and dry.  Neurological:     Mental Status: She is alert and oriented to person, place,  and time.  Psychiatric:        Mood and Affect: Mood normal.           Assessment & Plan:  Hypothyroidism, unspecified type Assessment & Plan: She is taking levothyroxine correctly. Repeat TSH pending.  Continue levothyroxine 75 mcg daily.  Orders: -     TSH -     TSH -     Comprehensive metabolic panel  Chronic migraine with aura without status migrainosus, not intractable Assessment & Plan: Improved with Topamax.  Continue Topamax 200 mg daily. Discussed that this is max dose.  Continue sumatriptan 50 mg PRN.  She will update, consider increasing to 100 mg.  Orders: -     Topiramate; Take 1 tablet (200 mg total) by mouth daily. For headache prevention  Dispense: 90 tablet; Refill: 2 -     SUMAtriptan Succinate; Take 1 tablet by mouth at migraine onset. May repeat in 2 hours if headache persists or recurs.  Dispense: 10 tablet; Refill: 0  Class 2 obesity due to excess calories without serious comorbidity with body mass index (BMI) of 35.0 to 35.9 in adult Assessment & Plan: Agree to dose increase to 2 mg of Ozempic. Will consult with pharmacy regarding how to update with patient assistance NovoNorodisk.  A1C and CMP pending.  Orders: -     Amb ref to Medical Nutrition Therapy-MNT  Prediabetes Assessment & Plan: Repeat A1C pending.  Remain off metformin.  Orders: -     Hemoglobin A1c -     Comprehensive metabolic panel        Doreene Nest, NP

## 2023-01-10 NOTE — Assessment & Plan Note (Signed)
Repeat A1C pending.  Remain off metformin.

## 2023-01-10 NOTE — Telephone Encounter (Signed)
Patient dropped off document  Novo Nordisk Patient Assistance , to be filled out by provider. Patient requested to send it back via Fax within ASAP. Document is located in providers tray at front office.Please advise at Mobile [phone] (mobile)  Patient requested a completed copy to be sent to her via mychart.

## 2023-01-10 NOTE — Telephone Encounter (Signed)
Tanya Abed, do you mind taking a look at her [employer] paperwork? She does not have a history of type 2 diabetes. I agree that she could benefit from GLP 1 agonist treatment for obesity. Not sure if [employer] covers for that diagnosis.  Paperwork placed in Lindsay's inbox.

## 2023-01-10 NOTE — Assessment & Plan Note (Signed)
She is taking levothyroxine correctly.  ?Repeat TSH pending. ? ?Continue levothyroxine 75 mcg daily. ? ?

## 2023-01-10 NOTE — Assessment & Plan Note (Addendum)
Agree to dose increase to 2 mg of Ozempic. Will consult with pharmacy regarding how to update with patient assistance NovoNorodisk.  A1C and CMP pending.

## 2023-01-10 NOTE — Assessment & Plan Note (Signed)
Improved with Topamax.  Continue Topamax 200 mg daily. Discussed that this is max dose.  Continue sumatriptan 50 mg PRN.  She will update, consider increasing to 100 mg.

## 2023-01-11 ENCOUNTER — Encounter: Payer: Self-pay | Admitting: Pharmacist

## 2023-01-11 NOTE — Progress Notes (Signed)
Novo Nordisk MAP Renewal for 2025 completed today.  Placed in PCP in basket for signature.

## 2023-01-11 NOTE — Telephone Encounter (Signed)
From Berenice Primas:  "Hello! Yes, I can take a look however I will say the program eligibility criteria exclude patients who are taking GLP1 for any off-label indications and therefore patient is not technically a candidate.  We can try for re-enrollment given she was enrolled this year and see what happens."  Completed and signed forms which were handed to [place].

## 2023-01-23 ENCOUNTER — Other Ambulatory Visit: Payer: Self-pay | Admitting: Primary Care

## 2023-01-23 DIAGNOSIS — G894 Chronic pain syndrome: Secondary | ICD-10-CM

## 2023-01-23 MED ORDER — METHOCARBAMOL 750 MG PO TABS: 750.0000 mg | ORAL_TABLET | Freq: Three times a day (TID) | ORAL | 0 refills | Status: DC | PRN

## 2023-02-21 ENCOUNTER — Other Ambulatory Visit: Payer: Self-pay | Admitting: Primary Care

## 2023-02-21 DIAGNOSIS — G43E09 Chronic migraine with aura, not intractable, without status migrainosus: Secondary | ICD-10-CM

## 2023-02-23 ENCOUNTER — Telehealth: Payer: Self-pay | Admitting: Primary Care

## 2023-02-23 DIAGNOSIS — I10 Essential (primary) hypertension: Secondary | ICD-10-CM

## 2023-02-23 DIAGNOSIS — R7303 Prediabetes: Secondary | ICD-10-CM

## 2023-02-23 DIAGNOSIS — E6609 Other obesity due to excess calories: Secondary | ICD-10-CM

## 2023-02-23 NOTE — Telephone Encounter (Signed)
Prescription Request  02/23/2023  LOV: 01/10/2023  What is the name of the medication or equipment? OZEMPIC 4MG /3ML PEN  Have you contacted your pharmacy to request a refill? Yes   Which pharmacy would you like this sent to?  [id number] [address], [place] - [address]     Patient notified that their request is being sent to the clinical staff for review and that they should receive a response within 2 business days.   Please advise at Mobile [phone] (mobile)  Pt states pt assistance states Chestine Spore [facility] in order for pt to receive voucher for meds. Pt states if our office needs more info, contact her.

## 2023-02-25 MED ORDER — SEMAGLUTIDE (1 MG/DOSE) 4 MG/3ML ~~LOC~~ SOPN: 1.0000 mg | PEN_INJECTOR | SUBCUTANEOUS | 0 refills | Status: DC

## 2023-02-25 NOTE — Telephone Encounter (Signed)
Noted. [facility] for exact dose [facility] to [facility] as requested.

## 2023-02-25 NOTE — Addendum Note (Signed)
Addended by: Doreene Nest on: 02/25/2023 09:48 PM   Modules accepted: Orders

## 2023-03-13 ENCOUNTER — Telehealth: Payer: Self-pay | Admitting: *Deleted

## 2023-03-13 NOTE — Telephone Encounter (Signed)
Copied from CRM [id number]. Topic: Clinical - Prescription Issue >> Mar 13, 2023  3:10 PM Sonny Dandy B wrote: Reason for CRM: pt called regarding her ozempic stated she never received a phone call to come pick it up. Pt is also requesting to speak with Natalia Leatherwood.  Pt is requesting a call back [phone] pt is also requesting antibotics for a ear ache

## 2023-03-13 NOTE — Telephone Encounter (Signed)
Called and spoke with patient. Advised patient we have not received shipment of PAP for ozempic yet. Advised I would send a message to pharmacy team to check on status of Ozempic.   In regards to earache, patient stated she perforated right ear drum and had a dark red liquid coming out of ear. Draining has stopped now and patient is requesting abx. I scheduled patient appt with Bethanie Dicker, NP tomorrow 12/31.

## 2023-03-14 ENCOUNTER — Telehealth: Payer: Self-pay

## 2023-03-14 ENCOUNTER — Ambulatory Visit (INDEPENDENT_AMBULATORY_CARE_PROVIDER_SITE_OTHER): Payer: PPO | Admitting: Nurse Practitioner

## 2023-03-14 VITALS — BP 128/80 | HR 110 | Temp 97.4°F | Ht 60.0 in | Wt 185.0 lb

## 2023-03-14 DIAGNOSIS — H66001 Acute suppurative otitis media without spontaneous rupture of ear drum, right ear: Secondary | ICD-10-CM

## 2023-03-14 DIAGNOSIS — H6690 Otitis media, unspecified, unspecified ear: Secondary | ICD-10-CM | POA: Insufficient documentation

## 2023-03-14 MED ORDER — AMOXICILLIN-POT CLAVULANATE 875-125 MG PO TABS: 1.0000 | ORAL_TABLET | Freq: Two times a day (BID) | ORAL | 0 refills | Status: DC

## 2023-03-14 NOTE — Telephone Encounter (Signed)
Called pt to see if she would like to come early pt was very rude and yelled and stated she was getting ready and that she will get here when she gets here.

## 2023-03-14 NOTE — Progress Notes (Signed)
 [name], NP-C Phone: [phone]  Tanya [name] is a 52 y.o. female who presents today for ear pain.   Discussed the use of AI scribe software for clinical note transcription with the patient, who gave verbal consent to proceed.  History of Present Illness   The patient, presents with a chief complaint of an earache, suspected to be a perforated eardrum. The onset of symptoms was on the 22nd, with initial denial due to preoccupation with work and attributing the discomfort to allergies. The patient describes a forceful sneeze, after which they noticed decreased hearing and a sensation of 'stuffiness' in the ear. The following day, they observed dark brown blood and clear fluid drainage from the ear, which has persisted since. The fluid is described as clear, not yellow or purulent, with a sweet smell. The patient denies any pain initially, but reports the onset of significant pain on Friday. They also report a low-grade fever, managed with pain medication.  The patient also reports associated symptoms of nausea and occasional dizziness, which they attribute to their chronic condition. They have been cautious about water exposure to the ear, using medical tape during showers and avoiding earbuds. The patient denies inserting anything into the ear or any trauma to the area.      Social History   Tobacco Use  Smoking Status Former   Types: Cigarettes  Smokeless Tobacco Never  Tobacco Comments   quit smoking cirarettes at age 66    Current Outpatient Medications on File Prior to Visit  Medication Sig Dispense Refill   albuterol  (VENTOLIN  HFA) 108 (90 Base) MCG/ACT inhaler Inhale 2 puffs into the lungs every 6 (six) hours as needed for wheezing or shortness of breath. 16 g 0   ALPRAZolam  (XANAX  XR) 1 MG 24 hr tablet Take 1 mg by mouth in the morning, at noon, and at bedtime.     amphetamine -dextroamphetamine  (ADDERALL XR) 20 MG 24 hr capsule Take 20 mg by mouth daily.       amphetamine -dextroamphetamine  (ADDERALL) 20 MG tablet Take 20 mg by mouth See admin instructions. Take 1 tablet every mroning and take 1 tablet at 1300.     b complex vitamins capsule Take 1 capsule by mouth daily.     buPROPion  (WELLBUTRIN  XL) 150 MG 24 hr tablet Take 150 mg by mouth See admin instructions. Take 1 tablet in the morning and take 1 tablet at 1300 NAME BRAND ONLY     buPROPion  (WELLBUTRIN  XL) 300 MG 24 hr tablet Take 300 mg by mouth daily. NAME BRAND ONLY     cetirizine (ZYRTEC) 10 MG tablet Take 10 mg by mouth at bedtime.     clobetasol  ointment (TEMOVATE ) 0.05 % Apply to affected areas twice daily (Patient taking differently: Apply 1 application  topically 2 (two) times daily. Apply to affected areas twice daily) 45 g 1   doxepin  (SINEQUAN ) 50 MG capsule Take 150 mg by mouth at bedtime.      estazolam (PROSOM) 2 MG tablet Take 2 mg by mouth at bedtime.     levothyroxine  (SYNTHROID ) 75 MCG tablet TAKE 1 TABLET EVERY DAY ON EMPTY STOMACHWITH A GLASS OF WATER AT LEAST 30-60 MINBEFORE BREAKFAST 90 tablet 0   melatonin 5 MG TABS Take 5 mg by mouth at bedtime as needed (sleep).     methocarbamol  (ROBAXIN ) 750 MG tablet Take 1 tablet (750 mg total) by mouth 3 (three) times daily as needed for muscle spasms. 270 tablet 0   Multiple Vitamins-Minerals (MULTIVITAMIN  WITH MINERALS) tablet Take 1 tablet by mouth daily.     mupirocin  ointment (BACTROBAN ) 2 % Apply 1 Application topically daily. To open areas and cover with band aid. 22 g 0   NALTREXONE HCL PO Take 6 mg by mouth daily.  (Patient not taking: Reported on 01/10/2023)     orlistat (ALLI) 60 MG capsule Take 120 mg by mouth 3 (three) times daily as needed (constipation). Do not exceed 3 capsules daily.     polyethylene glycol (MIRALAX  / GLYCOLAX ) 17 g packet Take 17 g by mouth daily as needed for moderate constipation.     Semaglutide , 1 MG/DOSE, 4 MG/3ML SOPN Inject 1 mg as directed once a week. 9 mL 0   SUMAtriptan  (IMITREX ) 50 MG  tablet TAKE ONE TABLET BY MOUTH AT MIGRAINE ONSET. MAY REPEAT IN TWO HOURS IF HEADACHE PERSISTS OR RECURS 10 tablet 0   tiaGABine  (GABITRIL ) 4 MG tablet Take 16 mg by mouth at bedtime.     topiramate  (TOPAMAX ) 200 MG tablet Take 1 tablet (200 mg total) by mouth daily. For headache prevention 90 tablet 2   tretinoin  (RETIN-A ) 0.05 % cream Apply topically at bedtime. 45 g 0   venlafaxine  XR (EFFEXOR -XR) 150 MG 24 hr capsule Take 300 mg by mouth daily with breakfast.      No current facility-administered medications on file prior to visit.    ROS see history of present illness  Objective  Physical Exam Vitals:   03/14/23 1117  BP: 128/80  Pulse: (!) 110  Temp: (!) 97.4 F (36.3 C)  SpO2: 97%    BP Readings from Last 3 Encounters:  03/14/23 128/80  01/10/23 116/82  10/10/22 120/78   Wt Readings from Last 3 Encounters:  03/14/23 185 lb (83.9 kg)  01/10/23 184 lb (83.5 kg)  10/10/22 181 lb (82.1 kg)    Physical Exam Constitutional:      General: She is not in acute distress.    Appearance: Normal appearance.  HENT:     Head: Normocephalic.     Right Ear: Decreased hearing noted. Tenderness present. Tympanic membrane is erythematous and bulging.     Left Ear: A middle ear effusion is present.     Ears:     Comments: Right ear- No drainage noted. Erythema throughout canal and small scab like lesion noted of right side of canal Cardiovascular:     Rate and Rhythm: Normal rate and regular rhythm.     Heart sounds: Normal heart sounds.  Pulmonary:     Effort: Pulmonary effort is normal.     Breath sounds: Normal breath sounds.  Skin:    General: Skin is warm and dry.  Neurological:     General: No focal deficit present.     Mental Status: She is alert.  Psychiatric:        Mood and Affect: Mood normal.        Behavior: Behavior normal.    Assessment/Plan: Please see individual problem list.  Non-recurrent acute suppurative otitis media of right ear without  spontaneous rupture of tympanic membrane Assessment & Plan: Suspected ear infection in the right ear is indicated by clear drainage, erythema and pain beginning on 03/10/2023, without fever. A small scrape in the ear canal may contribute to symptoms, though the eardrum appears intact. We will start antibiotics and monitor for improvement. Nasal spray is advised to aid in drainage. If there is no improvement after 3 days of antibiotics, they should notify the clinic for further  evaluation.   Orders: -     Amoxicillin -Pot Clavulanate; Take 1 tablet by mouth 2 (two) times daily.  Dispense: 20 tablet; Refill: 0   Return if symptoms worsen or fail to improve.   [name], NP-C [facility]

## 2023-03-14 NOTE — Assessment & Plan Note (Signed)
 Suspected ear infection in the right ear is indicated by clear drainage, erythema and pain beginning on 03/10/2023, without fever. A small scrape in the ear canal may contribute to symptoms, though the eardrum appears intact. We will start antibiotics and monitor for improvement. Nasal spray is advised to aid in drainage. If there is no improvement after 3 days of antibiotics, they should notify the clinic for further evaluation.

## 2023-03-14 NOTE — Telephone Encounter (Signed)
Pt arrived for her appt and was apologetic. She stated she was sorry if she came across as rude she just could not hear and was in pain.

## 2023-03-17 ENCOUNTER — Telehealth: Payer: Self-pay

## 2023-03-17 NOTE — Telephone Encounter (Signed)
 Copied from CRM [id number]. Topic: Clinical - Medical Advice >> Mar 17, 2023  1:27 PM [name] wrote: Reason for CRM: Patient was seen on 03/14/2023 by [name] who prescribed Amoxicillin -Pot Clavulanate. [name] asked patient to follow up and see if the medication was working but patient states it's not helping. Symptoms have not improved at all.

## 2023-03-17 NOTE — Telephone Encounter (Signed)
 Attempted to call pt to find out if things have gotten worst / any new symptoms however, mailbox is full was unable to leave a vm to have cb

## 2023-03-17 NOTE — Telephone Encounter (Signed)
 Patient was sent to our office. I spoke to patient. She states no new symptoms. She has taken all the medication as directed. Her pain in face has improved very little bit. Ears are still clogged and having drainage like in office. Patient states that she was told to call if no improvement so that she could get stronger antibiotic.

## 2023-03-20 ENCOUNTER — Other Ambulatory Visit: Payer: Self-pay | Admitting: Family

## 2023-03-20 MED ORDER — METHYLPREDNISOLONE 4 MG PO TBPK: ORAL_TABLET | ORAL | 0 refills | Status: DC

## 2023-04-03 ENCOUNTER — Other Ambulatory Visit: Payer: Self-pay | Admitting: Primary Care

## 2023-04-03 DIAGNOSIS — L709 Acne, unspecified: Secondary | ICD-10-CM

## 2023-04-03 DIAGNOSIS — R7303 Prediabetes: Secondary | ICD-10-CM

## 2023-04-03 DIAGNOSIS — E039 Hypothyroidism, unspecified: Secondary | ICD-10-CM

## 2023-04-03 MED ORDER — LEVOTHYROXINE SODIUM 75 MCG PO TABS: ORAL_TABLET | ORAL | 1 refills | Status: DC

## 2023-04-03 MED ORDER — TRETINOIN 0.05 % EX CREA
TOPICAL_CREAM | Freq: Every day | CUTANEOUS | 0 refills | Status: AC
Start: 2023-04-03 — End: ?

## 2023-04-05 ENCOUNTER — Telehealth: Payer: Self-pay

## 2023-04-05 NOTE — Telephone Encounter (Signed)
Voicemail left for patient advising we received PAP Ozempic. Labeled and placed in middle fridge.

## 2023-04-06 MED ORDER — BLOOD GLUCOSE TEST VI STRP: 1.0000 | ORAL_STRIP | Freq: Three times a day (TID) | 5 refills | Status: AC

## 2023-04-06 MED ORDER — LANCET DEVICE MISC: 1.0000 | Freq: Three times a day (TID) | 0 refills | Status: AC

## 2023-04-06 MED ORDER — BLOOD GLUCOSE MONITORING SUPPL DEVI: 1.0000 | Freq: Three times a day (TID) | 0 refills | Status: AC

## 2023-04-06 MED ORDER — LANCETS MISC. MISC: 1.0000 | Freq: Three times a day (TID) | 5 refills | Status: AC

## 2023-04-06 NOTE — Telephone Encounter (Signed)
Can you check to see if her Ozempic has arrived? Will you respond to the patient? Also let her know that I sent a glucometer kit to her pharmacy for One Touch.

## 2023-04-07 NOTE — Telephone Encounter (Signed)
Son Tanya [name] picked up medication

## 2023-04-28 ENCOUNTER — Other Ambulatory Visit: Payer: Self-pay | Admitting: Primary Care

## 2023-04-28 DIAGNOSIS — G43E09 Chronic migraine with aura, not intractable, without status migrainosus: Secondary | ICD-10-CM

## 2023-05-19 ENCOUNTER — Other Ambulatory Visit: Payer: Self-pay

## 2023-05-19 DIAGNOSIS — Z1231 Encounter for screening mammogram for malignant neoplasm of breast: Secondary | ICD-10-CM

## 2023-05-20 ENCOUNTER — Other Ambulatory Visit: Payer: Self-pay | Admitting: Primary Care

## 2023-05-20 DIAGNOSIS — E66812 Obesity, class 2: Secondary | ICD-10-CM

## 2023-05-20 DIAGNOSIS — I10 Essential (primary) hypertension: Secondary | ICD-10-CM

## 2023-05-20 DIAGNOSIS — R7303 Prediabetes: Secondary | ICD-10-CM

## 2023-06-03 ENCOUNTER — Other Ambulatory Visit: Payer: Self-pay | Admitting: Primary Care

## 2023-06-03 DIAGNOSIS — G894 Chronic pain syndrome: Secondary | ICD-10-CM

## 2023-06-08 ENCOUNTER — Other Ambulatory Visit: Payer: Self-pay | Admitting: Primary Care

## 2023-06-08 DIAGNOSIS — G894 Chronic pain syndrome: Secondary | ICD-10-CM

## 2023-06-19 ENCOUNTER — Ambulatory Visit
Admission: RE | Admit: 2023-06-19 | Discharge: 2023-06-19 | Disposition: A | Discharge status: Home / Self Care | Source: Ambulatory Visit | Attending: Primary Care | Admitting: Primary Care

## 2023-06-19 ENCOUNTER — Other Ambulatory Visit: Payer: Self-pay | Admitting: Primary Care

## 2023-06-19 DIAGNOSIS — Z1231 Encounter for screening mammogram for malignant neoplasm of breast: Secondary | ICD-10-CM | POA: Diagnosis not present

## 2023-06-21 ENCOUNTER — Other Ambulatory Visit (HOSPITAL_COMMUNITY)
Admission: RE | Admit: 2023-06-21 | Discharge: 2023-06-21 | Disposition: A | Discharge status: Home / Self Care | Source: Ambulatory Visit | Attending: Primary Care | Admitting: Primary Care

## 2023-06-21 ENCOUNTER — Ambulatory Visit: Admitting: Primary Care

## 2023-06-21 ENCOUNTER — Encounter: Payer: Self-pay | Admitting: Primary Care

## 2023-06-21 VITALS — BP 110/64 | HR 100 | Temp 98.2°F | Ht 60.0 in | Wt 184.0 lb

## 2023-06-21 DIAGNOSIS — F988 Other specified behavioral and emotional disorders with onset usually occurring in childhood and adolescence: Secondary | ICD-10-CM

## 2023-06-21 DIAGNOSIS — J4520 Mild intermittent asthma, uncomplicated: Secondary | ICD-10-CM | POA: Diagnosis not present

## 2023-06-21 DIAGNOSIS — E039 Hypothyroidism, unspecified: Secondary | ICD-10-CM | POA: Diagnosis not present

## 2023-06-21 DIAGNOSIS — F32A Depression, unspecified: Secondary | ICD-10-CM

## 2023-06-21 DIAGNOSIS — I10 Essential (primary) hypertension: Secondary | ICD-10-CM | POA: Diagnosis not present

## 2023-06-21 DIAGNOSIS — F4310 Post-traumatic stress disorder, unspecified: Secondary | ICD-10-CM

## 2023-06-21 DIAGNOSIS — G43E09 Chronic migraine with aura, not intractable, without status migrainosus: Secondary | ICD-10-CM | POA: Diagnosis not present

## 2023-06-21 DIAGNOSIS — R7303 Prediabetes: Secondary | ICD-10-CM

## 2023-06-21 DIAGNOSIS — R21 Rash and other nonspecific skin eruption: Secondary | ICD-10-CM

## 2023-06-21 DIAGNOSIS — Z1211 Encounter for screening for malignant neoplasm of colon: Secondary | ICD-10-CM

## 2023-06-21 DIAGNOSIS — Z6835 Body mass index (BMI) 35.0-35.9, adult: Secondary | ICD-10-CM

## 2023-06-21 DIAGNOSIS — Z124 Encounter for screening for malignant neoplasm of cervix: Secondary | ICD-10-CM | POA: Insufficient documentation

## 2023-06-21 DIAGNOSIS — Z1151 Encounter for screening for human papillomavirus (HPV): Secondary | ICD-10-CM | POA: Insufficient documentation

## 2023-06-21 DIAGNOSIS — Z01419 Encounter for gynecological examination (general) (routine) without abnormal findings: Secondary | ICD-10-CM | POA: Diagnosis not present

## 2023-06-21 DIAGNOSIS — Z0001 Encounter for general adult medical examination with abnormal findings: Secondary | ICD-10-CM | POA: Diagnosis not present

## 2023-06-21 DIAGNOSIS — E6609 Other obesity due to excess calories: Secondary | ICD-10-CM

## 2023-06-21 DIAGNOSIS — E66812 Obesity, class 2: Secondary | ICD-10-CM

## 2023-06-21 DIAGNOSIS — G894 Chronic pain syndrome: Secondary | ICD-10-CM

## 2023-06-21 MED ORDER — SUMATRIPTAN SUCCINATE 50 MG PO TABS: ORAL_TABLET | ORAL | 0 refills | Status: AC

## 2023-06-21 NOTE — Progress Notes (Signed)
 Subjective:    Patient ID: Tanya [name], female    DOB: 1970-12-19, 53 y.o.   MRN: [id number]  HPI  Tanya [name] is a very pleasant 53 y.o. female with a history of hypertension, chronic migraines, asthma, hypothyroidism, chronic pain syndrome, PTSD, prediabetes, class II obesity who presents today for complete physical and follow up of chronic conditions.  She believes she has an autoimmune condition because of her complex regional pain syndrome, dry mouth, dry eyes, joint pain, intermittent rashes to the chest and face, chronic abdominal bloating. She has never been tested for rheumatoid arthritis or Lupus as far as she knows. She denies recent tick bites.   Immunizations: -Tetanus: Completed in 2019 -Shingles: Completed at [facility] years ago.  -Pneumonia: Completed last in 2020  Diet: Fair diet.  Exercise: No regular exercise.  Eye exam: Completes annually  Dental exam: Completed > 1 year ago   Pap Smear: Completed in November 2021 Mammogram: Completed on 06/19/23   Colonoscopy: Never completed    BP Readings from Last 3 Encounters:  06/21/23 110/64  03/14/23 128/80  01/10/23 116/82   Wt Readings from Last 3 Encounters:  06/21/23 184 lb (83.5 kg)  03/14/23 185 lb (83.9 kg)  01/10/23 184 lb (83.5 kg)   Body mass index is 35.94 kg/m.     Review of Systems  Constitutional:  Negative for unexpected weight change.  HENT:  Negative for rhinorrhea.   Respiratory:  Negative for cough and shortness of breath.   Cardiovascular:  Negative for chest pain.  Gastrointestinal:  Negative for constipation and diarrhea.  Genitourinary:  Negative for difficulty urinating.  Musculoskeletal:  Positive for arthralgias.  Skin:  Positive for rash.  Allergic/Immunologic: Negative for environmental allergies.  Neurological:  Negative for dizziness, numbness and headaches.  Psychiatric/Behavioral:  The patient is not nervous/anxious.          Past Medical History:  Diagnosis  Date   ADD (attention deficit disorder)    Anemia    Anxiety    Arthritis    Asthma    Blood in stool    Chickenpox    Complex regional pain syndrome I    Complex regional pain syndrome of right upper extremity    Depression    Dysuria 06/23/2019   Frequent headaches    migraines   Heart murmur    Hypertension    Hypothyroidism    IBS (irritable bowel syndrome)    Insulin resistance    Lumbar radiculopathy    Mononucleosis    in high school   Ovarian cyst, left 02/03/2020   Ovarian cyst, right 02/03/2020   Pelvic pain 01/21/2020   Pneumonia    in high school   PONV (postoperative nausea and vomiting)    Preoperative clearance 11/09/2018   Seasonal allergies    Shingles    Spinal stenosis 11/21/2018   Spinal stenosis, lumbar    UTI (urinary tract infection)    Wears glasses     Social History   Socioeconomic History   Marital status: Married    Spouse name: Not on file   Number of children: Not on file   Years of education: Not on file   Highest education level: Not on file  Occupational History   Not on file  Tobacco Use   Smoking status: Former    Types: Cigarettes   Smokeless tobacco: Never   Tobacco comments:    quit smoking cirarettes at age 89  Vaping Use   Vaping  status: Never Used  Substance and Sexual Activity   Alcohol use: Yes    Comment: occasional   Drug use: Never   Sexual activity: Yes    Birth control/protection: Surgical  Other Topics Concern   Not on file  Social History Narrative   Married.   Disabled.   Once worked as an Charity fundraiser.   Social Drivers of Corporate investment banker Strain: Not on file  Food Insecurity: Not on file  Transportation Needs: Not on file  Physical Activity: Not on file  Stress: Not on file  Social Connections: Unknown (07/21/2021)   Received from [facility], [facility]   Social Network    Social Network: Not on file  Intimate Partner Violence: Unknown (06/16/2021)   Received from [facility],  [facility]   HITS    Physically Hurt: Not on file    Insult or Talk Down To: Not on file    Threaten Physical Harm: Not on file    Scream or Curse: Not on file    Past Surgical History:  Procedure Laterality Date   ABDOMINAL HYSTERECTOMY  2009   ANTERIOR AND POSTERIOR SPINAL FUSION  11/21/2018   AUGMENTATION MAMMAPLASTY Bilateral    lift done at time of augmentation   BACK SURGERY     BREAST ENHANCEMENT SURGERY  2008   CARPAL TUNNEL RELEASE Right 2009   CESAREAN SECTION  1993, 1997, 2006   cold cone colonization     LAPAROSCOPIC UNILATERAL SALPINGO OOPHERECTOMY Right 02/13/2020   Procedure: LAPAROSCOPIC RIGHT OOPHORECTOMY;  Surgeon: Nadara Mustard, MD;  Location: [facility] ORS;  Service: Gynecology;  Laterality: Right;   LEEP  2000   LUMBAR LAMINECTOMY/DECOMPRESSION MICRODISCECTOMY Right 11/21/2018   Procedure: RIGHT LUMBAR THREE THROUGH FOUR, LUMBAR FOUR THROUGH FIVE DECOMPRESSION;  Surgeon: Venita Lick, MD;  Location: [facility] OR;  Service: Orthopedics;  Laterality: Right;  150 mins   SPINAL CORD STIMULATOR INSERTION  2017   TUBAL LIGATION     UTERINE FIBROID SURGERY  2005    Family History  Problem Relation Age of Onset   Depression Mother    Learning disabilities Mother    Depression Father    Early death Father    Depression Sister    Drug abuse Sister    Early death Sister    Mental illness Sister    Miscarriages / [place] Sister    Multiple sclerosis Sister    Alcohol abuse Daughter    Depression Daughter    Diabetes Daughter    Learning disabilities Daughter    Depression Son    Learning disabilities Son    Cancer Maternal Grandmother    Hearing loss Maternal Grandmother    Breast cancer Maternal Grandmother    Alcohol abuse Paternal Grandmother    Heart disease Paternal Grandmother    Hyperlipidemia Paternal Grandmother    Hypertension Paternal Grandmother    Kidney disease Paternal Grandmother    ADD / ADHD Paternal Grandfather    COPD Paternal  Grandfather    Kidney disease Paternal Grandfather    Hypertension Paternal Grandfather    Hyperlipidemia Paternal Grandfather    Depression Son     Allergies  Allergen Reactions   Hydrocodone-Acetaminophen Hives, Itching and Other (See Comments)        Oxycodone-Acetaminophen Hives, Itching and Other (See Comments)        Codeine Other (See Comments)   Pertussis Vaccines Other (See Comments)    unknown    Current Outpatient Medications on File Prior  to Visit  Medication Sig Dispense Refill   albuterol (VENTOLIN HFA) 108 (90 Base) MCG/ACT inhaler Inhale 2 puffs into the lungs every 6 (six) hours as needed for wheezing or shortness of breath. 16 g 0   ALPRAZolam (XANAX XR) 1 MG 24 hr tablet Take 1 mg by mouth in the morning, at noon, and at bedtime.     amphetamine-dextroamphetamine (ADDERALL XR) 20 MG 24 hr capsule Take 20 mg by mouth daily.      amphetamine-dextroamphetamine (ADDERALL) 20 MG tablet Take 20 mg by mouth See admin instructions. Take 1 tablet every mroning and take 1 tablet at 1300.     b complex vitamins capsule Take 1 capsule by mouth daily.     Blood Glucose Monitoring Suppl DEVI 1 each by Does not apply route in the morning, at noon, and at bedtime. May substitute to any manufacturer covered by patient's insurance. 1 each 0   buPROPion (WELLBUTRIN XL) 150 MG 24 hr tablet Take 150 mg by mouth See admin instructions. Take 1 tablet in the morning and take 1 tablet at 1300 NAME BRAND ONLY     buPROPion (WELLBUTRIN XL) 300 MG 24 hr tablet Take 300 mg by mouth daily. NAME BRAND ONLY     cetirizine (ZYRTEC) 10 MG tablet Take 10 mg by mouth at bedtime.     clobetasol ointment (TEMOVATE) 0.05 % Apply to affected areas twice daily (Patient taking differently: Apply 1 application  topically 2 (two) times daily. Apply to affected areas twice daily) 45 g 1   doxepin (SINEQUAN) 50 MG capsule Take 150 mg by mouth at bedtime.      estazolam (PROSOM) 2 MG tablet Take 2 mg by mouth  at bedtime.     Glucose Blood (BLOOD GLUCOSE TEST STRIPS) STRP 1 each by In Vitro route in the morning, at noon, and at bedtime. May substitute to any manufacturer covered by patient's insurance. One Touch. 100 strip 5   Lancets Misc. MISC 1 each by Does not apply route in the morning, at noon, and at bedtime. May substitute to any manufacturer covered by patient's insurance. One Touch. 100 each 5   levothyroxine (SYNTHROID) 75 MCG tablet Take 1 tablet by mouth every morning on an empty stomach with water only.  No food or other medications for 30 minutes. 90 tablet 1   melatonin 5 MG TABS Take 5 mg by mouth at bedtime as needed (sleep).     methocarbamol (ROBAXIN) 750 MG tablet TAKE ONE TABLET BY MOUTH THREE TIMES A DAY AS NEEDED FOR MUSCLE SPASMS 270 tablet 0   Multiple Vitamins-Minerals (MULTIVITAMIN WITH MINERALS) tablet Take 1 tablet by mouth daily.     mupirocin ointment (BACTROBAN) 2 % Apply 1 Application topically daily. To open areas and cover with band aid. 22 g 0   NALTREXONE HCL PO Take 6 mg by mouth daily.     orlistat (ALLI) 60 MG capsule Take 120 mg by mouth 3 (three) times daily as needed (constipation). Do not exceed 3 capsules daily.     polyethylene glycol (MIRALAX / GLYCOLAX) 17 g packet Take 17 g by mouth daily as needed for moderate constipation.     Semaglutide, 1 MG/DOSE, (OZEMPIC, 1 MG/DOSE,) 4 MG/3ML SOPN INJECT 1 MG ONCE A WEEK AS DIRECTED 6 mL 0   tiaGABine (GABITRIL) 4 MG tablet Take 16 mg by mouth at bedtime.     topiramate (TOPAMAX) 200 MG tablet Take 1 tablet (200 mg total) by mouth daily.  For headache prevention 90 tablet 2   tretinoin (RETIN-A) 0.05 % cream Apply topically at bedtime. 45 g 0   venlafaxine XR (EFFEXOR-XR) 150 MG 24 hr capsule Take 300 mg by mouth daily with breakfast.      No current facility-administered medications on file prior to visit.    BP 110/64   Pulse 100   Temp 98.2 F (36.8 C) (Temporal)   Ht 5' (1.524 m)   Wt 184 lb (83.5 kg)    LMP  (LMP Unknown)   SpO2 98%   BMI 35.94 kg/m  Objective:   Physical Exam Exam conducted with a chaperone present.  HENT:     Right Ear: Tympanic membrane and ear canal normal.     Left Ear: Tympanic membrane and ear canal normal.  Eyes:     Pupils: Pupils are equal, round, and reactive to light.  Cardiovascular:     Rate and Rhythm: Normal rate and regular rhythm.  Pulmonary:     Effort: Pulmonary effort is normal.     Breath sounds: Normal breath sounds.  Abdominal:     General: Bowel sounds are normal.     Palpations: Abdomen is soft.     Tenderness: There is no abdominal tenderness.  Genitourinary:    Labia:        Right: No rash, tenderness or lesion.        Left: No rash, tenderness or lesion.      Vagina: Normal.     Cervix: Normal.     Uterus: Normal.      Adnexa: Right adnexa normal and left adnexa normal.  Musculoskeletal:        General: Normal range of motion.     Cervical back: Neck supple.  Skin:    General: Skin is warm and dry.     Findings: No rash.  Neurological:     Mental Status: She is alert and oriented to person, place, and time.     Cranial Nerves: No cranial nerve deficit.     Deep Tendon Reflexes:     Reflex Scores:      Patellar reflexes are 2+ on the right side and 2+ on the left side. Psychiatric:        Mood and Affect: Mood normal.           Assessment & Plan:  Encounter for annual general medical examination with abnormal findings in adult Assessment & Plan: Immunizations UTD. Pap smear due, completed today Mammogram UTD, waiting on radiology  Colonoscopy overdue, she declines, she opts for Cologuard, orders placed.   Discussed the importance of a healthy diet and regular exercise in order for weight loss, and to reduce the risk of further co-morbidity.  Exam stable. Labs pending.  Follow up in 1 year for repeat physical.    Essential hypertension Assessment & Plan: Controlled.  Continue off medications.   Continue to monitor.    Chronic migraine with aura without status migrainosus, not intractable Assessment & Plan: Stable.  Continue Topamax 200 mg daily. Continue Imitrex 50 mg PRN.    Orders: -     SUMAtriptan Succinate; TAKE 1 TABLET BY MOUTH AT MIGRAINE ONSET. MAY REPEAT IN 2 HOURS IF HEADACHE PERSISTS OR RECURS  Dispense: 10 tablet; Refill: 0  Mild intermittent asthma without complication Assessment & Plan: Controlled.  Continue albuterol PRN. Overall infrequent use.   Hypothyroidism, unspecified type Assessment & Plan: She is taking levothyroxine correctly.  Continue levothyroxine 75 mcg daily.  Reviewed TSH  from October 2024.   Attention deficit disorder, unspecified type Assessment & Plan: Following with psychiatrist.  Continue Adderall 20 mg daily.   Chronic pain syndrome Assessment & Plan: No recent visit with ortho pain management.  Continue methocarbamol 750 mg TID PRN.  Will check labs today including RF, CCP, sed rate, CRP.   Orders: -     C-reactive protein -     Rheumatoid factor -     Cyclic citrul peptide antibody, IgG -     Sedimentation rate  Screening for colon cancer -     Cologuard  Class 2 obesity due to excess calories without serious comorbidity with body mass index (BMI) of 35.0 to 35.9 in adult Assessment & Plan: One pound weight loss since last visit.  She recently increased her Ozempic to 2 mg weekly. Continue Ozempic 2 mg weekly.  Work on [employer] and exercise.    Depression, unspecified depression type Assessment & Plan: Following with psychiatry.   Continue bupropion XL 450 mg daily, doxepin 150 mg HS, estazolam 2 mg HS, venlafaxine ER 150 mg daily.    Post-traumatic stress disorder Assessment & Plan: Following with psychiatry.   Continue bupropion XL 450 mg daily, doxepin 150 mg HS, estazolam 2 mg HS, venlafaxine ER 150 mg daily.    Prediabetes Assessment & Plan: Repeat A1C pending.     Screening for cervical cancer -     Cytology - PAP  Rash and nonspecific skin eruption Assessment & Plan: No rash on exam today.  Checking ANA labs and alpha gal labs given recurrence   Orders: -     ANA -     Alpha-Gal Panel        Doreene Nest, NP

## 2023-06-21 NOTE — Assessment & Plan Note (Signed)
 Following with psychiatry.   Continue bupropion XL 450 mg daily, doxepin 150 mg HS, estazolam 2 mg HS, venlafaxine ER 150 mg daily.

## 2023-06-21 NOTE — Assessment & Plan Note (Signed)
 One pound weight loss since last visit.  She recently increased her Ozempic to 2 mg weekly. Continue Ozempic 2 mg weekly.  Work on [employer] and exercise.

## 2023-06-21 NOTE — Assessment & Plan Note (Signed)
Controlled.  Continue off medications.. Continue to monitor.  

## 2023-06-21 NOTE — Assessment & Plan Note (Signed)
 Repeat A1C pending.

## 2023-06-21 NOTE — Assessment & Plan Note (Signed)
 No rash on exam today.  Checking ANA labs and alpha gal labs given recurrence

## 2023-06-21 NOTE — Patient Instructions (Addendum)
 Stop by the lab prior to leaving today. I will notify you of your results once received.   Complete the Cologuard kit once received.  It was a pleasure to see you today!

## 2023-06-21 NOTE — Assessment & Plan Note (Addendum)
 She is taking levothyroxine correctly. Continue levothyroxine 75 mcg daily. Reviewed TSH from October 2024.

## 2023-06-21 NOTE — Assessment & Plan Note (Signed)
 Following with psychiatrist.  Continue Adderall 20 mg daily.

## 2023-06-21 NOTE — Assessment & Plan Note (Signed)
 Immunizations UTD. Pap smear due, completed today Mammogram UTD, waiting on radiology  Colonoscopy overdue, she declines, she opts for Cologuard, orders placed.   Discussed the importance of a healthy diet and regular exercise in order for weight loss, and to reduce the risk of further co-morbidity.  Exam stable. Labs pending.  Follow up in 1 year for repeat physical.

## 2023-06-21 NOTE — Assessment & Plan Note (Addendum)
 No recent visit with ortho pain management.  Continue methocarbamol 750 mg TID PRN.  Will check labs today including RF, CCP, sed rate, CRP.

## 2023-06-21 NOTE — Assessment & Plan Note (Signed)
 Controlled.  Continue albuterol PRN. Overall infrequent use.

## 2023-06-21 NOTE — Assessment & Plan Note (Signed)
 Stable.  Continue Topamax 200 mg daily. Continue Imitrex 50 mg PRN.

## 2023-06-22 LAB — C-REACTIVE PROTEIN: CRP: 1.1 mg/dL (ref 0.5–20.0)

## 2023-06-22 LAB — SEDIMENTATION RATE: Sed Rate: 2 mm/h (ref 0–30)

## 2023-06-25 DIAGNOSIS — R768 Other specified abnormal immunological findings in serum: Secondary | ICD-10-CM

## 2023-06-25 DIAGNOSIS — G8929 Other chronic pain: Secondary | ICD-10-CM

## 2023-06-27 LAB — ALPHA-GAL PANEL
Allergen, Mutton, f88: <0.1 kU/L
Allergen, Pork, f26: <0.1 kU/L
Beef: <0.1 kU/L
CLASS: 0
CLASS: 0
Class: 0
GALACTOSE-ALPHA-1,3-GALACTOSE IGE*: <0.1 kU/L (ref <0.10)

## 2023-06-27 LAB — RHEUMATOID FACTOR: Rheumatoid fact SerPl-aCnc: <10 [IU]/mL (ref <14)

## 2023-06-27 LAB — CYTOLOGY - PAP
Comment: NEGATIVE
Diagnosis: NEGATIVE
High risk HPV: NEGATIVE

## 2023-06-27 LAB — CYCLIC CITRUL PEPTIDE ANTIBODY, IGG: Cyclic Citrullin Peptide Ab: <16 U

## 2023-06-27 LAB — ANTI-NUCLEAR AB-TITER (ANA TITER): ANA Titer 1: 1:40 {titer} — ABNORMAL HIGH

## 2023-06-27 LAB — ANA: Anti Nuclear Antibody (ANA): POSITIVE — AB

## 2023-06-27 LAB — INTERPRETATION:

## 2023-07-04 DIAGNOSIS — F3341 Major depressive disorder, recurrent, in partial remission: Secondary | ICD-10-CM | POA: Diagnosis not present

## 2023-07-10 ENCOUNTER — Telehealth: Payer: Self-pay | Admitting: Primary Care

## 2023-07-10 NOTE — Telephone Encounter (Signed)
 Called patient she is having some cough and congestion advised will need visit before meds can be called in. I have set patient up for virtual. If any changes in symptoms or red words will go to ED or urgent care for evaluation.

## 2023-07-10 NOTE — Telephone Encounter (Signed)
 Noted and appreciate Bableen's evaluation.

## 2023-07-10 NOTE — Telephone Encounter (Signed)
 Copied from CRM [id number]. Topic: Clinical - Medication Refill >> Jul 10, 2023  4:01 PM Tanya [name] wrote: Most Recent Primary Care Visit:  Provider: CLARK, KATHERINE K  Department: [facility]  Visit Type: PHYSICAL  Date: 06/21/2023  Medication: PREDNISONE  pack   Has the patient contacted their pharmacy? No (Agent: If no, request that the patient contact the pharmacy for the refill. If patient does not wish to contact the pharmacy document the reason why and proceed with request.) (Agent: If yes, when and what did the pharmacy advise?)  Is this the correct pharmacy for this prescription? Yes If no, delete pharmacy and type the correct one.  This is the patient's preferred pharmacy:  [facility] [phone] [facility], [place] - [name] [place] [address] Phone: [phone] Fax: [phone]   Has the prescription been filled recently? No  Is the patient out of the medication? Yes  Has the patient been seen for an appointment in the last year OR does the patient have an upcoming appointment? Yes  Can we respond through MyChart? Yes  Agent: Please be advised that Rx refills may take up to 3 business days. We ask that you follow-up with your pharmacy.

## 2023-07-11 ENCOUNTER — Encounter: Payer: Self-pay | Admitting: General Practice

## 2023-07-11 ENCOUNTER — Telehealth (INDEPENDENT_AMBULATORY_CARE_PROVIDER_SITE_OTHER): Admitting: General Practice

## 2023-07-11 VITALS — BP 162/80 | HR 81 | Temp 102.7°F | Ht 60.0 in | Wt 179.0 lb

## 2023-07-11 DIAGNOSIS — J189 Pneumonia, unspecified organism: Secondary | ICD-10-CM | POA: Diagnosis not present

## 2023-07-11 MED ORDER — PREDNISONE 10 MG (21) PO TBPK: ORAL_TABLET | ORAL | 0 refills | Status: DC

## 2023-07-11 MED ORDER — AZITHROMYCIN 250 MG PO TABS: ORAL_TABLET | ORAL | 0 refills | Status: AC

## 2023-07-11 NOTE — Assessment & Plan Note (Signed)
 Symptoms suggestive of CAP.   With virtual visit, physical exam limitations. Start Azithromycin antibiotics for infection. Take 2 tablets by mouth today, then 1 tablet daily for 4 additional days. Rx sent. Start prednisone  taper. Rx sent.  ER precautions provided. Symptom management recommendations provided.   Patient advised to stay hydrated and rest.  Recommend in office appointment if symptoms worsen or do not improve. Consider chest x-ray if not better.

## 2023-07-11 NOTE — Patient Instructions (Signed)
 Start Azithromycin antibiotics for infection. Take 2 tablets by mouth today, then 1 tablet daily for 4 additional days.  Start prednisone  taper. Follow instructions on package.   You can try a few things over the counter to help with your symptoms including:  Cough: Delsym or Robitussin (get the off brand, works just as well) Chest Congestion: Mucinex (plain) Nasal Congestion/Ear Pressure/Sinus Pressure: Try using Flonase (fluticasone) nasal spray. Instill 1 spray in each nostril twice daily. This can be purchased over the counter. Body aches, fevers, headache: Ibuprofen (not to exceed 2400 mg in 24 hours) or Acetaminophen -Tylenol  (not to exceed 3000 mg in 24 hours) Runny Nose/Throat Drainage/Sneezing/Itchy or Watery Eyes: An antihistamine such as Zyrtec, Claritin, Xyzal, Allegra  Stay hydrated. Rest.   Schedule in office appt if symptoms worsen or do not improve.  As discussed, please go to the ER if you develop chest pain, shortness of breath or difficulty breathing.   It was a pleasure meeting you!

## 2023-07-11 NOTE — Progress Notes (Signed)
 Virtual Visit via Video Note  I connected with Tanya [name] on 07/11/23 at 10:40 AM EDT by a video enabled telemedicine application and verified that I am speaking with the correct person using two identifiers.  Patient Location: Home Provider Location: Office/Clinic  I discussed the limitations, risks, security, and privacy concerns of performing an evaluation and management service by video and the availability of in person appointments. I also discussed with the patient that there may be a patient responsible charge related to this service. The patient expressed understanding and agreed to proceed.  Subjective: PCP: Gabriel John, NP  Chief Complaint  Patient presents with   Conjunctivitis    Wheezing, sore throat, chest congestion, fever, cough (productive), chills, patients eye is draining mucus and not water, nausea, pounding headache, fatigue since Tuesday.  Has not done any flu or covid test at home. Patient has been taking chest decongestant medication and flonase, using albuterol  inhaler every 4 hrs; taking advil 800 mg for headache and benadryl  for the itchy eyes.    Conjunctivitis     Tanya [name] is a 53 year old female, patient of Tretha Fu, NP, with past medical history of HTN, asthma, chronic migraine with aura, hypothyroidism, prediabetes, chronic pain syndrome, depression, ADD and obesity presents via mychart video visit for an acute visit.   Congestion: symptom onset was 07/04/23 with sore throat, chest congestion, fever, productive cough, chills, wheezing, nausea, pounding headache, fatigue and eye drainage. She reports mucus drainage from eyes. She is coughing up with yellow mucus. She had a fever of 102.7 F a couple days ago. She reports that she feels loopy and groggy.   She feels like she was exposed to influenza by her husband, who tested positive for the flu.   She has not done any testing at home. She has tried chest decongestant, Flonase,  albuterol  every four hours, Advil and benadryl  with minimal relief.  Hx of asthma: yes Hx of smoking: yes Flu  vaccine: no Pneumonia vaccine: 2020  ROS: Per HPI  Current Outpatient Medications:    albuterol  (VENTOLIN  HFA) 108 (90 Base) MCG/ACT inhaler, Inhale 2 puffs into the lungs every 6 (six) hours as needed for wheezing or shortness of breath., Disp: 16 g, Rfl: 0   ALPRAZolam  (XANAX  XR) 1 MG 24 hr tablet, Take 1 mg by mouth in the morning, at noon, and at bedtime., Disp: , Rfl:    amphetamine -dextroamphetamine  (ADDERALL XR) 20 MG 24 hr capsule, Take 20 mg by mouth daily. , Disp: , Rfl:    amphetamine -dextroamphetamine  (ADDERALL) 20 MG tablet, Take 20 mg by mouth See admin instructions. Take 1 tablet every mroning and take 1 tablet at 1300., Disp: , Rfl:    azithromycin (ZITHROMAX) 250 MG tablet, Take 2 tablets on day 1, then 1 tablet daily on days 2 through 5, Disp: 6 tablet, Rfl: 0   b complex vitamins capsule, Take 1 capsule by mouth daily., Disp: , Rfl:    Blood Glucose Monitoring Suppl DEVI, 1 each by Does not apply route in the morning, at noon, and at bedtime. May substitute to any manufacturer covered by patient's insurance., Disp: 1 each, Rfl: 0   buPROPion  (WELLBUTRIN  XL) 150 MG 24 hr tablet, Take 150 mg by mouth See admin instructions. Take 1 tablet in the morning and take 1 tablet at 1300 NAME BRAND ONLY, Disp: , Rfl:    buPROPion  (WELLBUTRIN  XL) 300 MG 24 hr tablet, Take 300 mg by mouth daily. NAME BRAND  ONLY, Disp: , Rfl:    cetirizine (ZYRTEC) 10 MG tablet, Take 10 mg by mouth at bedtime., Disp: , Rfl:    clobetasol  ointment (TEMOVATE ) 0.05 %, Apply to affected areas twice daily (Patient taking differently: Apply 1 application  topically 2 (two) times daily. Apply to affected areas twice daily), Disp: 45 g, Rfl: 1   doxepin  (SINEQUAN ) 50 MG capsule, Take 150 mg by mouth at bedtime. , Disp: , Rfl:    estazolam (PROSOM) 2 MG tablet, Take 2 mg by mouth at bedtime., Disp: , Rfl:     Glucose Blood (BLOOD GLUCOSE TEST STRIPS) STRP, 1 each by In Vitro route in the morning, at noon, and at bedtime. May substitute to any manufacturer covered by patient's insurance. One Touch., Disp: 100 strip, Rfl: 5   Lancets Misc. MISC, 1 each by Does not apply route in the morning, at noon, and at bedtime. May substitute to any manufacturer covered by patient's insurance. One Touch., Disp: 100 each, Rfl: 5   levothyroxine  (SYNTHROID ) 75 MCG tablet, Take 1 tablet by mouth every morning on an empty stomach with water only.  No food or other medications for 30 minutes., Disp: 90 tablet, Rfl: 1   melatonin 5 MG TABS, Take 5 mg by mouth at bedtime as needed (sleep)., Disp: , Rfl:    methocarbamol  (ROBAXIN ) 750 MG tablet, TAKE ONE TABLET BY MOUTH THREE TIMES A DAY AS NEEDED FOR MUSCLE SPASMS, Disp: 270 tablet, Rfl: 0   Multiple Vitamins-Minerals (MULTIVITAMIN WITH MINERALS) tablet, Take 1 tablet by mouth daily., Disp: , Rfl:    mupirocin  ointment (BACTROBAN ) 2 %, Apply 1 Application topically daily. To open areas and cover with band aid., Disp: 22 g, Rfl: 0   NALTREXONE HCL PO, Take 6 mg by mouth daily., Disp: , Rfl:    orlistat (ALLI) 60 MG capsule, Take 120 mg by mouth 3 (three) times daily as needed (constipation). Do not exceed 3 capsules daily., Disp: , Rfl:    polyethylene glycol (MIRALAX  / GLYCOLAX ) 17 g packet, Take 17 g by mouth daily as needed for moderate constipation., Disp: , Rfl:    predniSONE  (STERAPRED UNI-PAK 21 TAB) 10 MG (21) TBPK tablet, Please follow instructions on the box., Disp: 21 tablet, Rfl: 0   QUEtiapine Fumarate (SEROQUEL PO), Take 150 mg by mouth at bedtime., Disp: , Rfl:    Semaglutide , 1 MG/DOSE, (OZEMPIC , 1 MG/DOSE,) 4 MG/3ML SOPN, INJECT 1 MG ONCE A WEEK AS DIRECTED, Disp: 6 mL, Rfl: 0   SUMAtriptan  (IMITREX ) 50 MG tablet, TAKE 1 TABLET BY MOUTH AT MIGRAINE ONSET. MAY REPEAT IN 2 HOURS IF HEADACHE PERSISTS OR RECURS, Disp: 10 tablet, Rfl: 0   tiaGABine  (GABITRIL ) 4  MG tablet, Take 16 mg by mouth at bedtime., Disp: , Rfl:    topiramate  (TOPAMAX ) 200 MG tablet, Take 1 tablet (200 mg total) by mouth daily. For headache prevention, Disp: 90 tablet, Rfl: 2   tretinoin  (RETIN-A ) 0.05 % cream, Apply topically at bedtime., Disp: 45 g, Rfl: 0   venlafaxine  XR (EFFEXOR -XR) 150 MG 24 hr capsule, Take 300 mg by mouth daily with breakfast. , Disp: , Rfl:   Observations/Objective: Today's Vitals   07/11/23 1035  BP: (!) 162/80  Pulse: 81  Temp: (!) 102.7 F (39.3 C)  TempSrc: Oral  Weight: 179 lb (81.2 kg)  Height: 5' (1.524 m)   Physical Exam Nursing note reviewed.  Constitutional:      Appearance: Normal appearance.  Eyes:  Conjunctiva/sclera: Conjunctivae normal.  Pulmonary:     Effort: Pulmonary effort is normal.  Neurological:     Mental Status: She is alert and oriented to person, place, and time.  Psychiatric:        Mood and Affect: Mood normal.        Behavior: Behavior normal.        Thought Content: Thought content normal.        Judgment: Judgment normal.     Assessment and Plan: Community acquired pneumonia, unspecified laterality Assessment & Plan: Symptoms suggestive of CAP.   With virtual visit, physical exam limitations. Start Azithromycin antibiotics for infection. Take 2 tablets by mouth today, then 1 tablet daily for 4 additional days. Rx sent. Start prednisone  taper. Rx sent.  ER precautions provided. Symptom management recommendations provided.   Patient advised to stay hydrated and rest.  Recommend in office appointment if symptoms worsen or do not improve. Consider chest x-ray if not better.  Orders: -     Azithromycin; Take 2 tablets on day 1, then 1 tablet daily on days 2 through 5  Dispense: 6 tablet; Refill: 0 -     predniSONE ; Please follow instructions on the box.  Dispense: 21 tablet; Refill: 0    Follow Up Instructions: Return if symptoms worsen or fail to improve.   I discussed the assessment and  treatment plan with the patient. The patient was provided an opportunity to ask questions, and all were answered. The patient agreed with the plan and demonstrated an understanding of the instructions.   The patient was advised to call back or seek an in-person evaluation if the symptoms worsen or if the condition fails to improve as anticipated.  The above assessment and management plan was discussed with the patient. The patient verbalized understanding of and has agreed to the management plan.   Jolanda Nation, NP

## 2023-07-11 NOTE — Telephone Encounter (Signed)
 Noted.

## 2023-08-11 ENCOUNTER — Telehealth: Payer: Self-pay

## 2023-08-11 NOTE — Telephone Encounter (Signed)
 Spoke with patient she advised that [facility] [facility] received an order form that did not have the correct number of boxes listed in the dispensing section and that they could not fill it. Order form had 3 listed under quantity instead of 4. We did not receive any fax notification from [employer] advising of this.  I have corrected this on the order form and refaxed it to [employer].  Patient states she will be due for her next Ozempic  2mg  shot on 6/6 and is asking if the office has any extra that she can pickup while she waits on this shipment to arrive. She was advised by [employer] that it will take 14 days once they receive the corrected form.   Heidi Llamas, can you advise if the boxes of ozempic  in the fridge that are not labeled are extras? If so, can we give this patient 2 boxes so that she wont miss any doses?

## 2023-08-11 NOTE — Telephone Encounter (Signed)
 Copied from CRM [id number]. Topic: Clinical - Medication Question >> Aug 11, 2023 12:52 PM Tanya [name] wrote: Reason for CRM: Patient requested call back regarding the Ozympic, says it's suppose to be 2mg  and she was suppose to get a call weeks ago. - says a company drops off to the office for her to pick up. Please call [phone] (M)

## 2023-08-14 NOTE — Telephone Encounter (Signed)
 Unfortunately, we cannot provide unlabeled medication to patients. Tanya [name], is it okay if she misses 1-2 doses of Ozempic  2 mg?

## 2023-08-14 NOTE — Telephone Encounter (Signed)
 Spoke with Avelino Lek, she advised the dispensing of the pens would be up to the providers discretion since we do not routinely give out samples. Please advise.  We only have 0.5mg  pens as extras, so she would need to use 4 pens for one week. Her next injection is due on 6/6.

## 2023-08-15 NOTE — Telephone Encounter (Signed)
 [facility], notify patient that it's okay if she misses 1-2 doses of her 2 mg dose. If she's concerned then someone can show her how to inject less than 2 mg on her current pen.

## 2023-08-15 NOTE — Telephone Encounter (Signed)
 Unable to reach patient. Left voicemail to return call to our office.

## 2023-08-16 NOTE — Telephone Encounter (Signed)
 Called patient and reviewed all information. Patient verbalized understanding. States she does not experience side effects with ozempic  and is okay with doing the 2mg  once she starts again.

## 2023-08-29 ENCOUNTER — Telehealth: Payer: Self-pay

## 2023-08-29 NOTE — Telephone Encounter (Signed)
 Received 4 boxes of Ozempic  through [employer] Placed in middle fridge Patient notified via voicemail.

## 2023-08-30 DIAGNOSIS — Z1211 Encounter for screening for malignant neoplasm of colon: Secondary | ICD-10-CM | POA: Diagnosis not present

## 2023-08-30 NOTE — Telephone Encounter (Signed)
 Son came in and picked up medication

## 2023-09-06 ENCOUNTER — Ambulatory Visit: Payer: Self-pay | Admitting: Primary Care

## 2023-09-06 LAB — COLOGUARD: COLOGUARD: NEGATIVE

## 2023-10-01 ENCOUNTER — Other Ambulatory Visit: Payer: Self-pay | Admitting: Primary Care

## 2023-10-01 DIAGNOSIS — E039 Hypothyroidism, unspecified: Secondary | ICD-10-CM

## 2023-10-01 NOTE — Telephone Encounter (Signed)
 Please call patient:  I need an updated thyroid  lab test within the next 2 months. Can we get her scheduled for a lab only appt? Non fasting.

## 2023-10-02 NOTE — Telephone Encounter (Signed)
 Lvm to schedule within next two months for non fasting labs

## 2023-10-04 NOTE — Telephone Encounter (Signed)
 Lvm to schedule non fasting labs

## 2023-10-23 ENCOUNTER — Other Ambulatory Visit: Payer: Self-pay | Admitting: Primary Care

## 2023-10-23 DIAGNOSIS — G43E09 Chronic migraine with aura, not intractable, without status migrainosus: Secondary | ICD-10-CM

## 2023-10-30 ENCOUNTER — Telehealth: Payer: Self-pay

## 2023-10-30 NOTE — Telephone Encounter (Signed)
 See patients mychart message, rheumatology has not received referral from April. Please resend.

## 2023-10-30 NOTE — Telephone Encounter (Signed)
 Copied from CRM #[id number]. Topic: General - Other >> Oct 30, 2023  3:21 PM Roselie BROCKS wrote: Reason for CRM: Patient called in to ask about a referral to Rheumatology, she would like a call back with next steps.

## 2023-10-31 ENCOUNTER — Other Ambulatory Visit (INDEPENDENT_AMBULATORY_CARE_PROVIDER_SITE_OTHER): Payer: Self-pay | Admitting: Primary Care

## 2023-10-31 ENCOUNTER — Other Ambulatory Visit (INDEPENDENT_AMBULATORY_CARE_PROVIDER_SITE_OTHER)

## 2023-10-31 DIAGNOSIS — I10 Essential (primary) hypertension: Secondary | ICD-10-CM | POA: Diagnosis not present

## 2023-10-31 DIAGNOSIS — R7303 Prediabetes: Secondary | ICD-10-CM

## 2023-10-31 DIAGNOSIS — E039 Hypothyroidism, unspecified: Secondary | ICD-10-CM | POA: Diagnosis not present

## 2023-10-31 LAB — LIPID PANEL
Cholesterol: 158 mg/dL (ref 0–200)
HDL: 37.5 mg/dL — ABNORMAL LOW (ref >39.00)
LDL Cholesterol: 90 mg/dL (ref 0–99)
NonHDL: 120.12
Total CHOL/HDL Ratio: 4
Triglycerides: 149 mg/dL (ref 0.0–149.0)
VLDL: 29.8 mg/dL (ref 0.0–40.0)

## 2023-10-31 LAB — COMPREHENSIVE METABOLIC PANEL WITH GFR
ALT: 22 U/L (ref 0–35)
AST: 12 U/L (ref 0–37)
Albumin: 4.5 g/dL (ref 3.5–5.2)
Alkaline Phosphatase: 71 U/L (ref 39–117)
BUN: 26 mg/dL — ABNORMAL HIGH (ref 6–23)
CO2: 24 meq/L (ref 19–32)
Calcium: 9 mg/dL (ref 8.4–10.5)
Chloride: 108 meq/L (ref 96–112)
Creatinine, Ser: 0.99 mg/dL (ref 0.40–1.20)
GFR: 65.6 mL/min (ref >60.00)
Glucose, Bld: 129 mg/dL — ABNORMAL HIGH (ref 70–99)
Potassium: 4.1 meq/L (ref 3.5–5.1)
Sodium: 141 meq/L (ref 135–145)
Total Bilirubin: 0.4 mg/dL (ref 0.2–1.2)
Total Protein: 6.7 g/dL (ref 6.0–8.3)

## 2023-10-31 LAB — TSH: TSH: 0.72 u[IU]/mL (ref 0.35–5.50)

## 2023-10-31 LAB — HEMOGLOBIN A1C: Hgb A1c MFr Bld: 5.3 % (ref 4.6–6.5)

## 2023-11-01 ENCOUNTER — Ambulatory Visit: Payer: Self-pay | Admitting: Primary Care

## 2023-11-01 NOTE — Telephone Encounter (Signed)
 Rheumatology referral was placed on 06/28/2023.  Our referrals team sent a letter to her MyChart portal on 06/29/2023.  See MyChart message.

## 2023-11-17 ENCOUNTER — Encounter: Payer: Self-pay | Admitting: *Deleted

## 2023-11-17 NOTE — Telephone Encounter (Signed)
 This Referral was incorrectly faxed and routed back in April.    I have corrected the location and faxed this referral.   I sent the patient updated and corrected referral information via MyChart letter and Message and apologized for the delay in care.

## 2023-11-24 ENCOUNTER — Telehealth: Payer: Self-pay | Admitting: Primary Care

## 2023-11-24 NOTE — Telephone Encounter (Signed)
 Patient needs a follow-up visit for weight management in order to continue to receive Ozempic .  Please schedule.

## 2023-11-27 NOTE — Telephone Encounter (Signed)
 lvm for pt to call office to schedule appt.

## 2023-11-29 ENCOUNTER — Encounter: Payer: Self-pay | Admitting: Pharmacist

## 2023-11-29 NOTE — Progress Notes (Signed)
 Patient Assistance Program (PAP) Application   Manufacturer: [employer]  - Refill request  Medication(s): Ozempic  2 mg   Refill form scanned into media tab, though total Ozempic  quantity does not equal 4. [employer] will deny any application with invalid quantity.   Re-printed and placed in PCP folder. Once signed, may fax to [employer] pool cc'd.

## 2023-12-01 ENCOUNTER — Ambulatory Visit: Admitting: Primary Care

## 2023-12-01 ENCOUNTER — Telehealth: Payer: Self-pay

## 2023-12-01 ENCOUNTER — Encounter: Payer: Self-pay | Admitting: Primary Care

## 2023-12-01 VITALS — BP 110/62 | HR 80 | Temp 97.2°F | Ht 60.0 in | Wt 160.0 lb

## 2023-12-01 DIAGNOSIS — E66811 Obesity, class 1: Secondary | ICD-10-CM

## 2023-12-01 DIAGNOSIS — Z6831 Body mass index (BMI) 31.0-31.9, adult: Secondary | ICD-10-CM | POA: Diagnosis not present

## 2023-12-01 DIAGNOSIS — R7303 Prediabetes: Secondary | ICD-10-CM

## 2023-12-01 DIAGNOSIS — E6609 Other obesity due to excess calories: Secondary | ICD-10-CM

## 2023-12-01 NOTE — Patient Instructions (Signed)
 Please schedule a follow up visit for 3 months for weight check.  It was a pleasure to see you today!

## 2023-12-01 NOTE — Progress Notes (Signed)
 Subjective:    Patient ID: Tanya [name], female    DOB: November 02, 1970, 53 y.o.   MRN: [id number]  Tanya [name] is a very pleasant 53 y.o. female with a history of hypertension, migraines, asthma, chronic pain syndrome, PTSD, prediabetes, obesity who presents today for follow-up weight management.  Currently managed on Ozempic  2 mg weekly.  Originally initiated on Ozempic  by her cardiologist greater than 1 year ago.  She has no history of type 2 diabetes but has been receiving Ozempic  through patient assistant program which was approved.   She has lost 19 pounds since 07/11/2023. She is drinking protein shakes and low carb diet. She denies GERD, nausea, abdominal pain. She stopped taking her Seroquel, doxepin  as they cause weight gain.   She feels well managed on her Ozempic  2 mg weekly. Recent A1C of 5.3 in August 2025 which was decreased from 5.8 in October 2024.  Wt Readings from Last 3 Encounters:  12/01/23 160 lb (72.6 kg)  07/11/23 179 lb (81.2 kg)  06/21/23 184 lb (83.5 kg)   Body mass index is 31.25 kg/m.    Review of Systems  Respiratory:  Negative for shortness of breath.   Cardiovascular:  Negative for chest pain.  Gastrointestinal:  Negative for abdominal pain, constipation, diarrhea and nausea.         Past Medical History:  Diagnosis Date   ADD (attention deficit disorder)    Anemia    Anxiety    Arthritis    Asthma    Blood in stool    Chickenpox    Complex regional pain syndrome I    Complex regional pain syndrome of right upper extremity    Depression    Dysuria 06/23/2019   Frequent headaches    migraines   Heart murmur    Hypertension    Hypothyroidism    IBS (irritable bowel syndrome)    Insulin  resistance    Lumbar radiculopathy    Mononucleosis    in high school   Ovarian cyst, left 02/03/2020   Ovarian cyst, right 02/03/2020   Pelvic pain 01/21/2020   Pneumonia    in high school   PONV (postoperative nausea and vomiting)     Preoperative clearance 11/09/2018   Seasonal allergies    Shingles    Spinal stenosis 11/21/2018   Spinal stenosis, lumbar    UTI (urinary tract infection)    Wears glasses     Social History   Socioeconomic History   Marital status: Married    Spouse name: Not on file   Number of children: Not on file   Years of education: Not on file   Highest education level: Not on file  Occupational History   Not on file  Tobacco Use   Smoking status: Former    Types: Cigarettes   Smokeless tobacco: Never   Tobacco comments:    quit smoking cirarettes at age 30  Vaping Use   Vaping status: Never Used  Substance and Sexual Activity   Alcohol use: Yes    Comment: occasional   Drug use: Never   Sexual activity: Yes    Birth control/protection: Surgical  Other Topics Concern   Not on file  Social History Narrative   Married.   Disabled.   Once worked as an Charity fundraiser.   Social Drivers of Corporate investment banker Strain: Not on file  Food Insecurity: Not on file  Transportation Needs: Not on file  Physical Activity: Not on file  Stress: Not on file  Social Connections: Unknown (07/21/2021)   [facility]   Social Network    Social Network: Not on file  Intimate Partner Violence: Unknown (06/16/2021)   Received from [facility]   HITS    Physically Hurt: Not on file    Insult or Talk Down To: Not on file    Threaten Physical Harm: Not on file    Scream or Curse: Not on file    Past Surgical History:  Procedure Laterality Date   ABDOMINAL HYSTERECTOMY  2009   ANTERIOR AND POSTERIOR SPINAL FUSION  11/21/2018   AUGMENTATION MAMMAPLASTY Bilateral    lift done at time of augmentation   BACK SURGERY     BREAST ENHANCEMENT SURGERY  2008   CARPAL TUNNEL RELEASE Right 2009   CESAREAN SECTION  1993, 1997, 2006   cold cone colonization     LAPAROSCOPIC UNILATERAL SALPINGO OOPHERECTOMY Right 02/13/2020   Procedure: LAPAROSCOPIC RIGHT OOPHORECTOMY;  Surgeon: Arloa Lamar SQUIBB, MD;  Location: [facility] ORS;  Service: Gynecology;  Laterality: Right;   LEEP  2000   LUMBAR LAMINECTOMY/DECOMPRESSION MICRODISCECTOMY Right 11/21/2018   Procedure: RIGHT LUMBAR THREE THROUGH FOUR, LUMBAR FOUR THROUGH FIVE DECOMPRESSION;  Surgeon: Burnetta Aures, MD;  Location: [facility] OR;  Service: Orthopedics;  Laterality: Right;  150 mins   SPINAL CORD STIMULATOR INSERTION  2017   TUBAL LIGATION     UTERINE FIBROID SURGERY  2005    Family History  Problem Relation Age of Onset   Depression Mother    Learning disabilities Mother    Depression Father    Early death Father    Depression Sister    Drug abuse Sister    Early death Sister    Mental illness Sister    Miscarriages / [place] Sister    Multiple sclerosis Sister    Alcohol abuse Daughter    Depression Daughter    Diabetes Daughter    Learning disabilities Daughter    Depression Son    Learning disabilities Son    Cancer Maternal Grandmother    Hearing loss Maternal Grandmother    Breast cancer Maternal Grandmother    Alcohol abuse Paternal Grandmother    Heart disease Paternal Grandmother    Hyperlipidemia Paternal Grandmother    Hypertension Paternal Grandmother    Kidney disease Paternal Grandmother    ADD / ADHD Paternal Grandfather    COPD Paternal Grandfather    Kidney disease Paternal Grandfather    Hypertension Paternal Grandfather    Hyperlipidemia Paternal Grandfather    Depression Son     Allergies  Allergen Reactions   Hydrocodone-Acetaminophen  Hives, Itching and Other (See Comments)        Oxycodone -Acetaminophen  Hives, Itching and Other (See Comments)        Codeine Other (See Comments)   Pertussis Vaccines Other (See Comments)    unknown    Current Outpatient Medications on File Prior to Visit  Medication Sig Dispense Refill   albuterol  (VENTOLIN  HFA) 108 (90 Base) MCG/ACT inhaler Inhale 2 puffs into the lungs every 6 (six) hours as needed for wheezing or shortness of breath. 16 g 0    ALPRAZolam  (XANAX  XR) 1 MG 24 hr tablet Take 1 mg by mouth in the morning, at noon, and at bedtime.     amphetamine -dextroamphetamine  (ADDERALL XR) 20 MG 24 hr capsule Take 20 mg by mouth daily.      amphetamine -dextroamphetamine  (ADDERALL) 20 MG tablet Take 20 mg by mouth See admin instructions.  Take 1 tablet every mroning and take 1 tablet at 1300.     b complex vitamins capsule Take 1 capsule by mouth daily.     Blood Glucose Monitoring Suppl DEVI 1 each by Does not apply route in the morning, at noon, and at bedtime. May substitute to any manufacturer covered by patient's insurance. 1 each 0   buPROPion  (WELLBUTRIN  XL) 150 MG 24 hr tablet Take 150 mg by mouth See admin instructions. Take 1 tablet in the morning and take 1 tablet at 1300 NAME BRAND ONLY     buPROPion  (WELLBUTRIN  XL) 300 MG 24 hr tablet Take 300 mg by mouth daily. NAME BRAND ONLY     cetirizine (ZYRTEC) 10 MG tablet Take 10 mg by mouth at bedtime.     clobetasol  ointment (TEMOVATE ) 0.05 % Apply to affected areas twice daily 45 g 1   estazolam (PROSOM) 2 MG tablet Take 2 mg by mouth at bedtime.     Glucose Blood (BLOOD GLUCOSE TEST STRIPS) STRP 1 each by In Vitro route in the morning, at noon, and at bedtime. May substitute to any manufacturer covered by patient's insurance. One Touch. 100 strip 5   Lancets Misc. MISC 1 each by Does not apply route in the morning, at noon, and at bedtime. May substitute to any manufacturer covered by patient's insurance. One Touch. 100 each 5   levothyroxine  (SYNTHROID ) 75 MCG tablet TAKE 1 TAB BY MOUTH EVERY MORNING ON EMPTY STOMACH WITH WATER ONLY NO FOOD OR OTHER MEDS FOR 30 MIN. 90 tablet 0   melatonin 5 MG TABS Take 5 mg by mouth at bedtime as needed (sleep).     methocarbamol  (ROBAXIN ) 750 MG tablet TAKE ONE TABLET BY MOUTH THREE TIMES A DAY AS NEEDED FOR MUSCLE SPASMS 270 tablet 0   Multiple Vitamins-Minerals (MULTIVITAMIN WITH MINERALS) tablet Take 1 tablet by mouth daily.     mupirocin   ointment (BACTROBAN ) 2 % Apply 1 Application topically daily. To open areas and cover with band aid. 22 g 0   orlistat (ALLI) 60 MG capsule Take 120 mg by mouth 3 (three) times daily as needed (constipation). Do not exceed 3 capsules daily.     polyethylene glycol (MIRALAX  / GLYCOLAX ) 17 g packet Take 17 g by mouth daily as needed for moderate constipation.     Semaglutide , 1 MG/DOSE, (OZEMPIC , 1 MG/DOSE,) 4 MG/3ML SOPN INJECT 1 MG ONCE A WEEK AS DIRECTED 6 mL 0   topiramate  (TOPAMAX ) 200 MG tablet TAKE ONE TABLET BY MOUTH ONE TIME DAILY FOR HEADACHE PREVENTION 90 tablet 1   tretinoin  (RETIN-A ) 0.05 % cream Apply topically at bedtime. 45 g 0   venlafaxine  XR (EFFEXOR -XR) 150 MG 24 hr capsule Take 300 mg by mouth daily with breakfast.      SUMAtriptan  (IMITREX ) 50 MG tablet TAKE 1 TABLET BY MOUTH AT MIGRAINE ONSET. MAY REPEAT IN 2 HOURS IF HEADACHE PERSISTS OR RECURS (Patient not taking: Reported on 12/01/2023) 10 tablet 0   No current facility-administered medications on file prior to visit.    BP 110/62   Pulse 80   Temp (!) 97.2 F (36.2 C) (Temporal)   Ht 5' (1.524 m)   Wt 160 lb (72.6 kg)   LMP  (LMP Unknown)   SpO2 97%   BMI 31.25 kg/m  Objective:   Physical Exam Cardiovascular:     Rate and Rhythm: Normal rate and regular rhythm.  Pulmonary:     Effort: Pulmonary effort is normal.  Breath sounds: Normal breath sounds.  Musculoskeletal:     Cervical back: Neck supple.  Skin:    General: Skin is warm and dry.  Neurological:     Mental Status: She is alert and oriented to person, place, and time.  Psychiatric:        Mood and Affect: Mood normal.     Physical Exam        Assessment & Plan:  Class 1 obesity due to excess calories without serious comorbidity with body mass index (BMI) of 31.0 to 31.9 in adult Assessment & Plan: Weight loss of 19 pounds.   Commended her on improving her diet. Encouraged walking.  Reviewed labs from August 2025  Continue Ozepmic  2 mg weekly for now.  Follow up in 3-4 months.      Prediabetes Assessment & Plan: Improved with recent A1C of 5.3!!!  Continue Ozempic  2 mg weekly     Assessment and Plan Assessment & Plan         Comer MARLA Gaskins, NP     History of Present Illness

## 2023-12-01 NOTE — Assessment & Plan Note (Signed)
 Improved with recent A1C of 5.3!!!  Continue Ozempic  2 mg weekly

## 2023-12-01 NOTE — Telephone Encounter (Signed)
 Received patient assistance medications for patient.  Ozempic  3boxes  Medications have been placed in the refrigerator and labeled with patient information  and Left message to inform patient (ok per dpr)  medication at office for pick up during normal business hours.

## 2023-12-01 NOTE — Assessment & Plan Note (Signed)
 Weight loss of 19 pounds.   Commended her on improving her diet. Encouraged walking.  Reviewed labs from August 2025  Continue Ozepmic 2 mg weekly for now.  Follow up in 3-4 months.

## 2023-12-04 NOTE — Progress Notes (Signed)
 PPW faxed to [facility] and sent to scanning.

## 2023-12-18 ENCOUNTER — Telehealth: Payer: Self-pay

## 2023-12-18 NOTE — Telephone Encounter (Signed)
 Attempted to reach out to patient to advise that she has 2  boxes of Ozempic  available for pick up. Unable to leave a message therefore a mychart message was sent.

## 2024-01-01 ENCOUNTER — Other Ambulatory Visit: Payer: Self-pay | Admitting: Primary Care

## 2024-01-01 DIAGNOSIS — E039 Hypothyroidism, unspecified: Secondary | ICD-10-CM

## 2024-03-01 ENCOUNTER — Ambulatory Visit: Admitting: Primary Care

## 2024-03-01 ENCOUNTER — Other Ambulatory Visit (HOSPITAL_COMMUNITY): Payer: Self-pay

## 2024-03-01 ENCOUNTER — Telehealth: Payer: Self-pay

## 2024-03-01 ENCOUNTER — Encounter: Payer: Self-pay | Admitting: Primary Care

## 2024-03-01 VITALS — BP 118/88 | HR 82 | Temp 98.2°F | Ht 60.0 in | Wt 172.2 lb

## 2024-03-01 DIAGNOSIS — J4520 Mild intermittent asthma, uncomplicated: Secondary | ICD-10-CM | POA: Diagnosis not present

## 2024-03-01 DIAGNOSIS — G894 Chronic pain syndrome: Secondary | ICD-10-CM

## 2024-03-01 DIAGNOSIS — E6609 Other obesity due to excess calories: Secondary | ICD-10-CM | POA: Diagnosis not present

## 2024-03-01 DIAGNOSIS — Z23 Encounter for immunization: Secondary | ICD-10-CM | POA: Diagnosis not present

## 2024-03-01 DIAGNOSIS — L709 Acne, unspecified: Secondary | ICD-10-CM | POA: Insufficient documentation

## 2024-03-01 DIAGNOSIS — Z6833 Body mass index (BMI) 33.0-33.9, adult: Secondary | ICD-10-CM

## 2024-03-01 DIAGNOSIS — M5441 Lumbago with sciatica, right side: Secondary | ICD-10-CM

## 2024-03-01 DIAGNOSIS — R159 Full incontinence of feces: Secondary | ICD-10-CM | POA: Diagnosis not present

## 2024-03-01 DIAGNOSIS — G8929 Other chronic pain: Secondary | ICD-10-CM

## 2024-03-01 DIAGNOSIS — G43E09 Chronic migraine with aura, not intractable, without status migrainosus: Secondary | ICD-10-CM

## 2024-03-01 DIAGNOSIS — L700 Acne vulgaris: Secondary | ICD-10-CM | POA: Diagnosis not present

## 2024-03-01 DIAGNOSIS — E66811 Obesity, class 1: Secondary | ICD-10-CM

## 2024-03-01 MED ORDER — METHOCARBAMOL 750 MG PO TABS: 750.0000 mg | ORAL_TABLET | Freq: Three times a day (TID) | ORAL | 0 refills | Status: AC | PRN

## 2024-03-01 MED ORDER — TOPIRAMATE 200 MG PO TABS: 200.0000 mg | ORAL_TABLET | Freq: Every day | ORAL | 1 refills | Status: AC

## 2024-03-01 MED ORDER — TRETINOIN 0.05 % EX CREA: TOPICAL_CREAM | Freq: Every day | CUTANEOUS | 0 refills | Status: AC

## 2024-03-01 MED ORDER — ALBUTEROL SULFATE HFA 108 (90 BASE) MCG/ACT IN AERS: 2.0000 | INHALATION_SPRAY | Freq: Four times a day (QID) | RESPIRATORY_TRACT | 0 refills | Status: AC | PRN

## 2024-03-01 MED ORDER — SUMATRIPTAN SUCCINATE 100 MG PO TABS: ORAL_TABLET | ORAL | 0 refills | Status: DC

## 2024-03-01 NOTE — Progress Notes (Signed)
 "  Subjective:    Patient ID: Tanya [name], female    DOB: 1970-08-09, 53 y.o.   MRN: [id number]  Tanya [name] is a very pleasant 53 y.o. female with a history of hypertension, migraines, PCOS, chronic pain syndrome, PTSD, ADD, prediabetes, obesity who presents today for follow-up of obesity and migraines. She is also needing several refills.   Currently managed on Ozempic  2 mg weekly for which she gets through patient assistance program for obesity.  Originally initiated by her cardiologist.  Since her last visit she has been unable to pick up her Ozempic  prescription due to an increase in cost from $159 per month to $172 per month. Since being off Ozempic  her food cravings have increased, she is eating larger portion sizes. She cannot exercise due to her chronic pain.  She has forms with her today for the PAP program for Ozempic   Body mass index is 33.63 kg/m.   Wt Readings from Last 3 Encounters:  03/01/24 172 lb 3.2 oz (78.1 kg)  12/01/23 160 lb (72.6 kg)  07/11/23 179 lb (81.2 kg)    She has a chronic history of migraines and is managed on Topamax  200 mg HS and Imitrex  50 mg. Her migraines are located to the bilateral frontal region wrapping around the sides of her head.   She stopped taking her Topamax  in > 6 months as she was told by the pharmacy that she didn't have a prescription ready. She did not call our office. A 6 month refill was provided to her pharmacy in August 2025. She is requiring 2 Imitrex  tablets at a time, has required this historically.   She's tearful today due to chronic back pain with radiation to lower extremities, chronic neuropathy. She had to reschedule her rheumatology appointment due to a migraine and lack of transportation. She's also noticed intermittent fecal incontinence for the last 1 month, occurs several days weekly. She questions if her inflammation is causing this. Previously following with neurosurgery and orthopedics for chronic back pain.  Previously followed with [facility], no recent follow up as I cannot leave the house. She is needing her handicap placard renewed.    Review of Systems  Respiratory:  Negative for shortness of breath.   Cardiovascular:  Negative for chest pain.  Gastrointestinal:        Fecal incontinence   Musculoskeletal:  Positive for arthralgias and back pain.  Neurological:  Positive for headaches.  Psychiatric/Behavioral:  The patient is nervous/anxious.          Past Medical History:  Diagnosis Date   ADD (attention deficit disorder)    Anemia    Anxiety    Arthritis    Asthma    Blood in stool    Chickenpox    Community acquired pneumonia 07/11/2023   Complex regional pain syndrome I    Complex regional pain syndrome of right upper extremity    Depression    Dysuria 06/23/2019   Frequent headaches    migraines   Heart murmur    Hypertension    Hypothyroidism    IBS (irritable bowel syndrome)    Insulin  resistance    Lumbar radiculopathy    Mononucleosis    in high school   Ovarian cyst, left 02/03/2020   Ovarian cyst, right 02/03/2020   Pelvic pain 01/21/2020   Pneumonia    in high school   PONV (postoperative nausea and vomiting)    Preoperative clearance 11/09/2018   Seasonal allergies  Shingles    Spinal stenosis 11/21/2018   Spinal stenosis, lumbar    UTI (urinary tract infection)    Wears glasses     Social History   Socioeconomic History   Marital status: Married    Spouse name: Not on file   Number of children: Not on file   Years of education: Not on file   Highest education level: Not on file  Occupational History   Not on file  Tobacco Use   Smoking status: Former    Types: Cigarettes   Smokeless tobacco: Never   Tobacco comments:    quit smoking cirarettes at age 34  Vaping Use   Vaping status: Never Used  Substance and Sexual Activity   Alcohol use: Yes    Comment: occasional   Drug use: Never   Sexual activity: Yes     Birth control/protection: Surgical  Other Topics Concern   Not on file  Social History Narrative   Married.   Disabled.   Once worked as an CHARITY FUNDRAISER.   Social Drivers of Health   Tobacco Use: Medium Risk (12/01/2023)   Patient History    Smoking Tobacco Use: Former    Smokeless Tobacco Use: Never    Passive Exposure: Not on file  Financial Resource Strain: Not on file  Food Insecurity: Not on file  Transportation Needs: Not on file  Physical Activity: Not on file  Stress: Not on file  Social Connections: Not on file  Intimate Partner Violence: Not on file  Depression (PHQ2-9): High Risk (03/01/2024)   Depression (PHQ2-9)    PHQ-2 Score: 18  Alcohol Screen: Not on file  Housing: Not on file  Utilities: Not on file  Health Literacy: Not on file    Past Surgical History:  Procedure Laterality Date   ABDOMINAL HYSTERECTOMY  2009   ANTERIOR AND POSTERIOR SPINAL FUSION  11/21/2018   AUGMENTATION MAMMAPLASTY Bilateral    lift done at time of augmentation   BACK SURGERY     BREAST ENHANCEMENT SURGERY  2008   CARPAL TUNNEL RELEASE Right 2009   CESAREAN SECTION  1993, 1997, 2006   cold cone colonization     LAPAROSCOPIC UNILATERAL SALPINGO OOPHERECTOMY Right 02/13/2020   Procedure: LAPAROSCOPIC RIGHT OOPHORECTOMY;  Surgeon: Arloa Lamar SQUIBB, MD;  Location: [facility] ORS;  Service: Gynecology;  Laterality: Right;   LEEP  2000   LUMBAR LAMINECTOMY/DECOMPRESSION MICRODISCECTOMY Right 11/21/2018   Procedure: RIGHT LUMBAR THREE THROUGH FOUR, LUMBAR FOUR THROUGH FIVE DECOMPRESSION;  Surgeon: Burnetta Aures, MD;  Location: [facility] OR;  Service: Orthopedics;  Laterality: Right;  150 mins   SPINAL CORD STIMULATOR INSERTION  2017   TUBAL LIGATION     UTERINE FIBROID SURGERY  2005    Family History  Problem Relation Age of Onset   Depression Mother    Learning disabilities Mother    Depression Father    Early death Father    Depression Sister    Drug abuse Sister    Early death Sister    Mental  illness Sister    Miscarriages / Stillbirths Sister    Multiple sclerosis Sister    Alcohol abuse Daughter    Depression Daughter    Diabetes Daughter    Learning disabilities Daughter    Depression Son    Learning disabilities Son    Cancer Maternal Grandmother    Hearing loss Maternal Grandmother    Breast cancer Maternal Grandmother    Alcohol abuse Paternal Grandmother    Heart disease Paternal  Grandmother    Hyperlipidemia Paternal Grandmother    Hypertension Paternal Grandmother    Kidney disease Paternal Grandmother    ADD / ADHD Paternal Grandfather    COPD Paternal Grandfather    Kidney disease Paternal Grandfather    Hypertension Paternal Grandfather    Hyperlipidemia Paternal Grandfather    Depression Son     Allergies[1]  Medications Ordered Prior to Encounter[2]  BP 118/88   Pulse 82   Temp 98.2 F (36.8 C) (Oral)   Ht 5' (1.524 m)   Wt 172 lb 3.2 oz (78.1 kg)   LMP  (LMP Unknown)   SpO2 96%   BMI 33.63 kg/m  Objective:   Physical Exam Cardiovascular:     Rate and Rhythm: Normal rate and regular rhythm.  Pulmonary:     Effort: Pulmonary effort is normal.     Breath sounds: Normal breath sounds.  Musculoskeletal:     Cervical back: Neck supple.  Skin:    General: Skin is warm and dry.  Neurological:     Mental Status: She is alert and oriented to person, place, and time.  Psychiatric:     Comments: Tearful      Physical Exam        Assessment & Plan:  Chronic bilateral low back pain with right-sided sciatica Assessment & Plan: Progressing.  Stat MRI ordered and pending of lumbar spine Handicap placard renewed Refill provided for methocarbamol   Orders: -     MR LUMBAR SPINE WO CONTRAST; Future  Chronic migraine with aura without status migrainosus, not intractable Assessment & Plan: Uncontrolled but has also been on both maintenance therapy for greater than 6 months  Will resume topiramate  200 mg at bedtime.  Discussed to start  with smaller dose and gradually increase to a goal of 200 mg at bedtime. Increase sumatriptan  to 100 mg as needed.  She will update  Orders: -     SUMAtriptan  Succinate; Take 1 tablet by mouth at migraine onset. May repeat in 2 hours if headache persists or recurs.  Dispense: 10 tablet; Refill: 0 -     Topiramate ; Take 1 tablet (200 mg total) by mouth at bedtime. For migraine prevention  Dispense: 90 tablet; Refill: 1  Mild intermittent asthma without complication -     Albuterol  Sulfate HFA; Inhale 2 puffs into the lungs every 6 (six) hours as needed for wheezing or shortness of breath.  Dispense: 16 g; Refill: 0  Chronic pain syndrome -     Methocarbamol ; Take 1 tablet (750 mg total) by mouth every 8 (eight) hours as needed for muscle spasms.  Dispense: 270 tablet; Refill: 0  Incontinence of feces, unspecified fecal incontinence type Assessment & Plan: Unclear cause.  Given history of multiple lumbar surgeries, coupled with increased lower back pain, will obtain stat MRI of lumbar spine. She agrees.  Orders placed.  Orders: -     MR LUMBAR SPINE WO CONTRAST; Future  Acne vulgaris Assessment & Plan: Refill provided for Retin-A  cream.  Orders: -     Tretinoin ; Apply topically at bedtime.  Dispense: 45 g; Refill: 0  Class 1 obesity due to excess calories without serious comorbidity with body mass index (BMI) of 33.0 to 33.9 in adult Assessment & Plan: Weight gain since discontinuation of the Ozempic .  Unclear what has occurred with her Ozempic  prescription. She will complete new paperwork and provided to our office once done.  May need to resume at lower dose     Assessment and  Plan Assessment & Plan  I personally spent a total of 40 minutes in the care of the patient today including preparing to see the patient, getting/reviewing separately obtained history, performing a medically appropriate exam/evaluation, counseling and educating, placing orders, and documenting  clinical information in the EHR.        Maleko Greulich K Riverlyn Kizziah, NP       [1]  Allergies Allergen Reactions   Hydrocodone-Acetaminophen  Hives, Itching and Other (See Comments)        Oxycodone -Acetaminophen  Hives, Itching and Other (See Comments)        Codeine Other (See Comments)   Pertussis Vaccines Other (See Comments)    unknown  [2]  Current Outpatient Medications on File Prior to Visit  Medication Sig Dispense Refill   ALPRAZolam  (XANAX  XR) 1 MG 24 hr tablet Take 1 mg by mouth in the morning, at noon, and at bedtime.     amphetamine -dextroamphetamine  (ADDERALL XR) 20 MG 24 hr capsule Take 20 mg by mouth daily.      amphetamine -dextroamphetamine  (ADDERALL) 20 MG tablet Take 20 mg by mouth See admin instructions. Take 1 tablet every mroning and take 1 tablet at 1300.     b complex vitamins capsule Take 1 capsule by mouth daily.     Blood Glucose Monitoring Suppl DEVI 1 each by Does not apply route in the morning, at noon, and at bedtime. May substitute to any manufacturer covered by patient's insurance. 1 each 0   buPROPion  (WELLBUTRIN  XL) 150 MG 24 hr tablet Take 150 mg by mouth See admin instructions. Take 1 tablet in the morning and take 1 tablet at 1300 NAME BRAND ONLY     buPROPion  (WELLBUTRIN  XL) 300 MG 24 hr tablet Take 300 mg by mouth daily. NAME BRAND ONLY     cetirizine (ZYRTEC) 10 MG tablet Take 10 mg by mouth at bedtime.     clobetasol  ointment (TEMOVATE ) 0.05 % Apply to affected areas twice daily 45 g 1   estazolam (PROSOM) 2 MG tablet Take 2 mg by mouth at bedtime.     Glucose Blood (BLOOD GLUCOSE TEST STRIPS) STRP 1 each by In Vitro route in the morning, at noon, and at bedtime. May substitute to any manufacturer covered by patient's insurance. One Touch. 100 strip 5   Lancets Misc. MISC 1 each by Does not apply route in the morning, at noon, and at bedtime. May substitute to any manufacturer covered by patient's insurance. One Touch. 100 each 5   levothyroxine   (SYNTHROID ) 75 MCG tablet TAKE 1 TAB BY MOUTH EVERY MORNING ON EMPTY STOMACH WITH WATER ONLY NO FOOD OR OTHER MEDS FOR 30 MIN. 90 tablet 1   melatonin 5 MG TABS Take 5 mg by mouth at bedtime as needed (sleep).     Multiple Vitamins-Minerals (MULTIVITAMIN WITH MINERALS) tablet Take 1 tablet by mouth daily.     mupirocin  ointment (BACTROBAN ) 2 % Apply 1 Application topically daily. To open areas and cover with band aid. 22 g 0   polyethylene glycol (MIRALAX  / GLYCOLAX ) 17 g packet Take 17 g by mouth daily as needed for moderate constipation.     Semaglutide , 1 MG/DOSE, (OZEMPIC , 1 MG/DOSE,) 4 MG/3ML SOPN INJECT 1 MG ONCE A WEEK AS DIRECTED 6 mL 0   venlafaxine  XR (EFFEXOR -XR) 150 MG 24 hr capsule Take 300 mg by mouth daily with breakfast.      No current facility-administered medications on file prior to visit.   "

## 2024-03-01 NOTE — Assessment & Plan Note (Signed)
 Uncontrolled but has also been on both maintenance therapy for greater than 6 months  Will resume topiramate  200 mg at bedtime.  Discussed to start with smaller dose and gradually increase to a goal of 200 mg at bedtime. Increase sumatriptan  to 100 mg as needed.  She will update

## 2024-03-01 NOTE — Telephone Encounter (Signed)
 Patient picked up medication

## 2024-03-01 NOTE — Assessment & Plan Note (Signed)
 Unclear cause.  Given history of multiple lumbar surgeries, coupled with increased lower back pain, will obtain stat MRI of lumbar spine. She agrees.  Orders placed.

## 2024-03-01 NOTE — Patient Instructions (Addendum)
 You will receive a phone call regarding the MRI of your back.  Resume topiramate  for your migraines.  Start with one quarter of a tablet x 1 week, then 1/2 tablet x 1 week, then increase to 1 full tablet thereafter.  Increase the dose of your Imitrex  to 100 mg for migraine abortion  Please schedule a physical to meet with me in 4  months.   It was a pleasure to see you today!

## 2024-03-01 NOTE — Addendum Note (Signed)
 Addended by: SEBASTIAN SHU on: 03/01/2024 11:45 AM   Modules accepted: Orders

## 2024-03-01 NOTE — Assessment & Plan Note (Signed)
 Refill provided for Retin-A  cream.

## 2024-03-01 NOTE — Assessment & Plan Note (Signed)
 Weight gain since discontinuation of the Ozempic .  Unclear what has occurred with her Ozempic  prescription. She will complete new paperwork and provided to our office once done.  May need to resume at lower dose

## 2024-03-01 NOTE — Assessment & Plan Note (Addendum)
 Progressing.  Stat MRI ordered and pending of lumbar spine Handicap placard renewed Refill provided for methocarbamol 

## 2024-03-01 NOTE — Telephone Encounter (Signed)
 Pharmacy Patient Advocate Encounter   Received notification from Onbase that prior authorization for Tretinoin  0.05% cream is required/requested.   Insurance verification completed.   The patient is insured through [facility] [employer].   Per test claim: PA required and submitted KEY/EOC/Request #: [id number] from 03/01/24 to 03/01/25. Ran test claim, Copay is $76.71. This test claim was processed through [facility]- copay amounts may vary at other pharmacies due to pharmacy/plan contracts, or as the patient moves through the different stages of their insurance plan.

## 2024-03-02 ENCOUNTER — Ambulatory Visit

## 2024-03-02 ENCOUNTER — Ambulatory Visit (HOSPITAL_COMMUNITY)
Admission: RE | Admit: 2024-03-02 | Discharge: 2024-03-02 | Disposition: A | Discharge status: Home / Self Care | Source: Ambulatory Visit | Attending: Primary Care | Admitting: Primary Care

## 2024-03-02 DIAGNOSIS — R159 Full incontinence of feces: Secondary | ICD-10-CM | POA: Insufficient documentation

## 2024-03-02 DIAGNOSIS — G8929 Other chronic pain: Secondary | ICD-10-CM | POA: Diagnosis present

## 2024-03-02 DIAGNOSIS — M5441 Lumbago with sciatica, right side: Secondary | ICD-10-CM | POA: Diagnosis present

## 2024-03-04 ENCOUNTER — Telehealth: Payer: Self-pay | Admitting: Primary Care

## 2024-03-04 ENCOUNTER — Ambulatory Visit: Payer: Self-pay | Admitting: Primary Care

## 2024-03-04 ENCOUNTER — Other Ambulatory Visit (HOSPITAL_COMMUNITY): Payer: Self-pay

## 2024-03-04 DIAGNOSIS — G8929 Other chronic pain: Secondary | ICD-10-CM

## 2024-03-04 NOTE — Telephone Encounter (Signed)
 Message has already sent to [facility] about this.

## 2024-03-04 NOTE — Telephone Encounter (Signed)
 Copied from CRM [id number]. Topic: General - Call Back - No Documentation >> Mar 04, 2024  2:16 PM Leah C wrote: Reason for CRM: Patient called back, She agrees to go a neurosurgeon and would like to go to [place].

## 2024-03-05 ENCOUNTER — Other Ambulatory Visit: Payer: Self-pay | Admitting: Primary Care

## 2024-03-05 DIAGNOSIS — G8929 Other chronic pain: Secondary | ICD-10-CM

## 2024-04-04 DIAGNOSIS — G43E09 Chronic migraine with aura, not intractable, without status migrainosus: Secondary | ICD-10-CM

## 2024-04-04 MED ORDER — SUMATRIPTAN SUCCINATE 100 MG PO TABS: ORAL_TABLET | ORAL | 0 refills | Status: AC

## 2024-04-18 NOTE — Progress Notes (Signed)
 Tanya [name]                                          MRN: [id number]   04/18/2024   The [facility] Quality Team Specialist reviewed this patient medical record for the purposes of chart review for care gap closure. The following were reviewed: chart review for care gap closure-kidney health evaluation for diabetes:eGFR  and uACR.    [facility] Quality Team

## 2024-06-25 ENCOUNTER — Encounter: Admitting: Primary Care

## 2024-07-02 ENCOUNTER — Encounter: Admitting: Primary Care
